# Patient Record
Sex: Male | Born: 1940 | Race: White | Hispanic: No | Marital: Married | Smoking: Former smoker
Health system: Southern US, Community
[De-identification: ages and names within clinical notes are randomized; demographics above are authoritative.]

## PROBLEM LIST (undated history)

## (undated) DIAGNOSIS — Z87442 Personal history of urinary calculi: Secondary | ICD-10-CM

## (undated) DIAGNOSIS — I639 Cerebral infarction, unspecified: Secondary | ICD-10-CM

## (undated) DIAGNOSIS — J189 Pneumonia, unspecified organism: Secondary | ICD-10-CM

## (undated) DIAGNOSIS — G252 Other specified forms of tremor: Principal | ICD-10-CM

## (undated) DIAGNOSIS — C801 Malignant (primary) neoplasm, unspecified: Secondary | ICD-10-CM

## (undated) DIAGNOSIS — N200 Calculus of kidney: Secondary | ICD-10-CM

## (undated) DIAGNOSIS — G250 Essential tremor: Secondary | ICD-10-CM

## (undated) DIAGNOSIS — I10 Essential (primary) hypertension: Secondary | ICD-10-CM

## (undated) HISTORY — PX: CHOLECYSTECTOMY: SHX55

## (undated) HISTORY — PX: KNEE SURGERY: SHX244

## (undated) HISTORY — DX: Malignant (primary) neoplasm, unspecified: C80.1

## (undated) HISTORY — PX: CATARACT EXTRACTION: SUR2

## (undated) HISTORY — PX: APPENDECTOMY: SHX54

## (undated) HISTORY — DX: Other specified forms of tremor: G25.2

## (undated) HISTORY — PX: SHOULDER SURGERY: SHX246

## (undated) HISTORY — PX: TRIGGER FINGER RELEASE: SHX641

## (undated) HISTORY — DX: Essential tremor: G25.0

---

## 1997-12-30 ENCOUNTER — Ambulatory Visit (HOSPITAL_BASED_OUTPATIENT_CLINIC_OR_DEPARTMENT_OTHER): Admission: RE | Admit: 1997-12-30 | Discharge: 1997-12-30 | Payer: Self-pay | Admitting: Orthopedic Surgery

## 1998-02-24 ENCOUNTER — Ambulatory Visit (HOSPITAL_BASED_OUTPATIENT_CLINIC_OR_DEPARTMENT_OTHER): Admission: RE | Admit: 1998-02-24 | Discharge: 1998-02-24 | Payer: Self-pay | Admitting: Orthopedic Surgery

## 1999-01-15 ENCOUNTER — Ambulatory Visit (HOSPITAL_BASED_OUTPATIENT_CLINIC_OR_DEPARTMENT_OTHER): Admission: RE | Admit: 1999-01-15 | Discharge: 1999-01-15 | Payer: Self-pay | Admitting: Orthopedic Surgery

## 2001-01-09 ENCOUNTER — Ambulatory Visit (HOSPITAL_BASED_OUTPATIENT_CLINIC_OR_DEPARTMENT_OTHER): Admission: RE | Admit: 2001-01-09 | Discharge: 2001-01-09 | Payer: Self-pay | Admitting: Orthopedic Surgery

## 2001-01-09 ENCOUNTER — Encounter (INDEPENDENT_AMBULATORY_CARE_PROVIDER_SITE_OTHER): Payer: Self-pay | Admitting: *Deleted

## 2003-04-10 ENCOUNTER — Encounter: Admission: RE | Admit: 2003-04-10 | Discharge: 2003-04-22 | Payer: Self-pay | Admitting: Orthopaedic Surgery

## 2003-04-26 DIAGNOSIS — I639 Cerebral infarction, unspecified: Secondary | ICD-10-CM

## 2003-04-26 HISTORY — DX: Cerebral infarction, unspecified: I63.9

## 2003-05-12 ENCOUNTER — Ambulatory Visit (HOSPITAL_BASED_OUTPATIENT_CLINIC_OR_DEPARTMENT_OTHER): Admission: RE | Admit: 2003-05-12 | Discharge: 2003-05-12 | Payer: Self-pay | Admitting: Plastic Surgery

## 2003-05-12 ENCOUNTER — Ambulatory Visit (HOSPITAL_COMMUNITY): Admission: RE | Admit: 2003-05-12 | Discharge: 2003-05-12 | Payer: Self-pay | Admitting: Plastic Surgery

## 2003-05-12 ENCOUNTER — Encounter (INDEPENDENT_AMBULATORY_CARE_PROVIDER_SITE_OTHER): Payer: Self-pay | Admitting: Specialist

## 2005-06-28 ENCOUNTER — Ambulatory Visit (HOSPITAL_COMMUNITY): Admission: RE | Admit: 2005-06-28 | Discharge: 2005-06-28 | Payer: Self-pay | Admitting: Internal Medicine

## 2006-03-03 ENCOUNTER — Encounter: Admission: RE | Admit: 2006-03-03 | Discharge: 2006-03-03 | Payer: Self-pay | Admitting: Otolaryngology

## 2006-10-31 ENCOUNTER — Ambulatory Visit (HOSPITAL_COMMUNITY): Admission: RE | Admit: 2006-10-31 | Discharge: 2006-10-31 | Payer: Self-pay | Admitting: Internal Medicine

## 2007-02-24 ENCOUNTER — Observation Stay (HOSPITAL_COMMUNITY): Admission: EM | Admit: 2007-02-24 | Discharge: 2007-02-25 | Payer: Self-pay | Admitting: Emergency Medicine

## 2007-03-12 ENCOUNTER — Ambulatory Visit (HOSPITAL_COMMUNITY): Admission: RE | Admit: 2007-03-12 | Discharge: 2007-03-12 | Payer: Self-pay | Admitting: Urology

## 2008-10-08 ENCOUNTER — Ambulatory Visit (HOSPITAL_COMMUNITY): Admission: RE | Admit: 2008-10-08 | Discharge: 2008-10-08 | Payer: Self-pay | Admitting: Internal Medicine

## 2009-01-22 ENCOUNTER — Encounter (INDEPENDENT_AMBULATORY_CARE_PROVIDER_SITE_OTHER): Payer: Self-pay | Admitting: General Surgery

## 2009-01-22 ENCOUNTER — Ambulatory Visit (HOSPITAL_COMMUNITY): Admission: RE | Admit: 2009-01-22 | Discharge: 2009-01-22 | Payer: Self-pay | Admitting: General Surgery

## 2009-03-10 ENCOUNTER — Ambulatory Visit (HOSPITAL_COMMUNITY): Admission: RE | Admit: 2009-03-10 | Discharge: 2009-03-10 | Payer: Self-pay | Admitting: Internal Medicine

## 2010-05-14 ENCOUNTER — Encounter
Admission: RE | Admit: 2010-05-14 | Discharge: 2010-05-14 | Payer: Self-pay | Source: Home / Self Care | Attending: Orthopedic Surgery | Admitting: Orthopedic Surgery

## 2010-05-17 ENCOUNTER — Encounter: Payer: Self-pay | Admitting: Internal Medicine

## 2010-07-28 LAB — CREATININE, SERUM: GFR calc Af Amer: >60 mL/min (ref >60)

## 2010-07-28 LAB — BUN: BUN: 12 mg/dL (ref 6–23)

## 2010-07-30 LAB — COMPREHENSIVE METABOLIC PANEL
AST: 25 U/L (ref 0–37)
BUN: 12 mg/dL (ref 6–23)
CO2: 29 mEq/L (ref 19–32)
Calcium: 9.5 mg/dL (ref 8.4–10.5)
Chloride: 107 mEq/L (ref 96–112)
Creatinine, Ser: 0.88 mg/dL (ref 0.4–1.5)
GFR calc Af Amer: >60 mL/min (ref >60)
GFR calc non Af Amer: >60 mL/min (ref >60)
Total Bilirubin: 0.7 mg/dL (ref 0.3–1.2)

## 2010-07-30 LAB — CBC
HCT: 44.8 % (ref 39.0–52.0)
MCHC: 34.4 g/dL (ref 30.0–36.0)
MCV: 95.6 fL (ref 78.0–100.0)
RBC: 4.68 MIL/uL (ref 4.22–5.81)

## 2010-07-30 LAB — BILIRUBIN, DIRECT: Bilirubin, Direct: <0.1 mg/dL (ref 0.0–0.3)

## 2010-09-07 NOTE — Op Note (Signed)
Ross [name], Ross [name]                ACCOUNT NO.:  [id number]   MEDICAL RECORD NO.:  [id number]          PATIENT TYPE:  OBV   LOCATION:  1433                         FACILITY:  [facility]   PHYSICIAN:  Sigmund I. Patsi Sears, M.D.DATE OF BIRTH:  [birth year]   DATE OF PROCEDURE:  02/25/2007  DATE OF DISCHARGE:  02/25/2007                               OPERATIVE REPORT   PREOPERATIVE DIAGNOSIS:  Impacted left upper ureteral calculus.   POSTOPERATIVE DIAGNOSES:  Impacted left upper ureteral calculus.   OPERATION:  Cystourethroscopy, left retrograde pyelogram with  interpretation, left ureteroscopy, laser fractionation of left renal  pelvic stone, left  double-J catheter (5-French x 24 cm Polaris).   SURGEON:  Sigmund I. Patsi Sears, M.D.   ANESTHESIA:  General LMA.   PREPARATION:  After appropriate preanesthesia, the patient is brought to  the operating room, placed on the operating room in dorsal supine  position where general LMA anesthesia was introduced.  He was then  replaced in dorsal lithotomy position where the pubis was prepped with  Betadine solution and draped in usual fashion.   HISTORY:  This 70 year old married male, was seen emergency room  yesterday, with impacted 6-mm left upper pole, left upper ureter stone.  The patient had nausea, vomiting and the ureteral colic.  In addition,  the patient was noted to have an 11 mm stone in the left renal pelvis,  and multiple smaller stones throughout the left kidney.  He is scheduled  to leave [place] for a hunting trip in [place] in four days.  In  addition, the patient has a past history of recent stroke, with some  visual field deficit, currently being treated with Aggrenox.  He was  felt to be a poor candidate for lithotripsy, and because that because of  his travel schedule, the need for surgical intervention to relieve the  colic was balanced against the history of recent stroke.  It was decided  to go ahead and then  take the patient to the operating room, and relieve  him of the ureteral stone, leave the double-J catheter in place.   PROCEDURE:  Cystourethroscopy accomplished, left retrograde pyelograms,  which showed a 6 mm approximately 1 cm distal to the left UPJ.  Note  that the left side had previously been marked.   Ureteroscopy was accomplished, and the stone was pushed into the renal  pelvis.  Using a laser fiber, the tone was fractionated, the pieces  removed.  Interestingly, I did not visualize the larger 11 mm stone,  seen on CT scan.  I did see least three infundibula, but I could not  manipulate the long ureteroscope nd into the calyces.   A 5-French x 24 cm Polaris stent was then manipulated into the renal  pelvis, with the bottom portion of the stent in the bladder.  The  patient was covered with IV antibiotic, as well as IV Toradol, awakened  and taken recovery room in good condition.      Sigmund I. Patsi Sears, M.D.  Electronically Signed     SIT/MEDQ  D:  02/25/2007  T:  02/26/2007  Job:  [id number]

## 2010-09-07 NOTE — H&P (Signed)
NAMEBURNICE, OESTREICHER                ACCOUNT NO.:  [id number]   MEDICAL RECORD NO.:  [id number]          PATIENT TYPE:  OBV   LOCATION:  1433                         FACILITY:  [facility]   PHYSICIAN:  Sigmund I. Patsi Sears, M.D.DATE OF BIRTH:  [birth year]   DATE OF ADMISSION:  02/24/2007  DATE OF DISCHARGE:  02/25/2007                              HISTORY & PHYSICAL   HISTORY:  Mr. Dalgleish is a 70 year old married male from [place] who  awoke at 4 a.m. this morning with GI upset, then noted beginning of left  flank pain at a level of 2/10 at 5 a.m.  He then began having nausea and  vomiting with repeat episodes of ureteral colic occurring 5x in the  early morning.  About 6 a.m. the patient's pain began to progress to a  level of 8/10 and he was seen in [facility] emergency room.  No fever,  no chills, except with vomiting (vagal reaction).  There is no gross  hematuria.  The patient did have a kidney stone approximately 10 years  ago, but never saw it.  Pain however resolved spontaneously.  He has not  been evaluated in the past for kidney stone formation.   PAST MEDICAL HISTORY:  1. 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  2. Melanoma right foot 2005.  3. Appendectomy, ruptured, age 89.  4. Bilateral arthroscopy of the knee, shoulder, foot, as well as      trigger finger surgery.  5. The past medical history is significant for hypertension, recent      onset.  6. Elevated cholesterol.  7. Hypogonadism.   MEDICATIONS:  1. Include Crestor 20 mg a day.  2. Aggrenox 25/250 b.i.d.  3. AndroGel (unknown dose).  4. Herbal medications including vitamin D, folic acid, Centrum      multivitamin plus eye vitamin and p.r.n. Zyrtec.   ALLERGIES:  None known.   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   PHYSICAL EXAMINATION:  GENERAL:  Shows a well-developed, well-nourished  male in no  acute distress.  VITAL SIGNS:  Blood pressure is 142/67, pulse 63, temperature 97.6.  NECK:  Supple, nontender.  CHEST:  Clear to P and A.  ABDOMEN:  Soft, decreasing bowel sounds without organomegaly and without  masses.  There is left lower quadrant pain to percussion at deep  palpation.  GENITOURINARY:  Shows normal penis, urethra and normal glans.  Testicles  measure 4 x 4 cm and nontender.  RECTAL:  Examination shows normal sphincter tone.  Perineum shows normal  inspection.  EXTREMITIES:  No cyanosis, no edema.  PSYCHOLOGIC:  Normal orientation to time, person and place.   IMPRESSION:  I have discussed the case thoroughly with the patient and  his wife.  The patient may be discharged with pain medication to follow  up on Monday with Dr. Logan Bores, but because of his pending hunting trip and  because  the patient has continued to have pain this  morning we will admit the  patient to the hospital for 23 hour observation and then plan on  cystoscopy, laser fractionation of stone in the a.m.  Note that the  patient is status post CVA in July and on Aggrenox.  I will consult with  Dr. Chestine Spore, or his on call physician, with regard to any medical  concerns.      Sigmund I. Patsi Sears, M.D.  Electronically Signed     SIT/MEDQ  D:  02/24/2007  T:  02/25/2007  Job:  [id number]   cc:   Margaretmary Bayley, M.D.  Fax: [phone]

## 2010-09-10 NOTE — Op Note (Signed)
[name], Ross [name]                            ACCOUNT NO.:  [id number]   MEDICAL RECORD NO.:  [id number]                   PATIENT TYPE:  REC   LOCATION:  [facility]H                                 FACILITY:  [facility]   PHYSICIAN:  Etter Sjogren, M.D.                  DATE OF BIRTH:  1941-01-31   DATE OF PROCEDURE:  05/12/2003  DATE OF DISCHARGE:  04/22/2003                                 OPERATIVE REPORT   PREOPERATIVE DIAGNOSIS:  1. 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  2. Evolving melanoma of the right foot, total area greater than 1 cm.   POSTOPERATIVE DIAGNOSIS:  1. 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  2. Evolving melanoma of the right foot, total area greater than 1 cm.  3. Complicated open wound of the scalp secondary to carcinoma excision.  4. Complicated open wound of the right foot secondary to evolving melanoma     excision.   OPERATION PERFORMED:   SURGEON:  Etter Sjogren, M.D.   ANESTHESIA:  1% Xylocaine with epinephrine plus bicarb as well as MAC  sedation.   INDICATIONS FOR PROCEDURE:  The patient is a 70 year old man who has a basal  cell carcinoma identified by his dermatologist. This was located on the left  side of his scalp.  Wider excision was planned.  In addition, he had a new  pigmented lesion on his right foot that was biopsied by his dermatologist.  This appeared to be a severely dysplastic nevus, possible evolving melanoma.  An excision with 4 mm margins was recommended by his dermatologist.  The  nature of these procedures and the risks as well as complications were  discussed including the possibility of further surgery, the possibility of  wound healing problems, including infection and he understood all this and  wished to proceed.   DESCRIPTION OF PROCEDURE:  The patient was taken to the operating room and  placed supine.  Under satisfactory sedation, he was prepped with Betadine  and draped with sterile drapes.  The margins  were marked around the basal  cell carcinoma, taking 4 mm margins around the basal cell carcinoma on his  scalp.  Margins marked around the lesion on the right foot again with 4 mm  margins as recommended by dermatologist.  Satisfactory local anesthesia was  achieved using 1% Xylocaine with epinephrine plus bicarb.  The excisions  were performed, the lesion on the foot was tagged on its superior suture.  Thorough irrigation and meticulous hemostasis having been achieved with  electrocautery, the scalp wound had to be undermined in order for closure.  Again confirming meticulous hemostasis, layered closure with 2-0 Vicryl  interrupted deep sutures and 2-0 Vicryl interrupted inverted deep dermal and  4-0 and 3-0 Prolene simple interrupted sutures.  For the foot 2-0 Vicryl  interrupted inverted deep sutures and 3-0 Prolene simple interrupted  sutures  to achieve the closure.  Dry sterile dressings were applied.  An Ace wrap  was applied from the foot up towards the leg to control edema for the right  foot and to help to promote wound healing.  The patient tolerated the  procedure well.   DISPOSITION:  See him back in the office in approximately two weeks.  Percocet __________ mg tablets for pain total of 20 given, one to two by  mouth every four to six hours as needed for pain.                                               Etter Sjogren, M.D.    DB/MEDQ  D:  05/12/2003  T:  05/12/2003  Job:  [id number]

## 2010-09-10 NOTE — Op Note (Signed)
[facility]  Patient:    LUMIR, DEMETRIOU Visit Number: [id number] MRN: [id number]          Service Type: DSU Location: [facility] Attending Physician:  Susa Day Dictated by:   Katy Fitch Naaman Plummer., M.D. Proc. Date: 01/09/01 Admit Date:  01/09/2001                             Operative Report  PREOPERATIVE DIAGNOSIS:  Painful mass, right ring finger volar radial aspect of middle phalanx adjacent to radial neurovascular bundle.  POSTOPERATIVE DIAGNOSIS:  Vascular lesion consistent with a hemangioma.  PROCEDURE:  Excisional biopsy of mass, right ring finger adjacent to radial neurovascular bundle.  SURGEON:  Katy Fitch. Sypher, Montez Hageman., M.D.  ASSISTANT:  Jonni Sanger, P.A.  ANESTHESIA:  Marcaine 0.25% and 2% lidocaine metacarpal head-level block, right ring finger, supervising anesthesiologist J. Claybon Jabs, M.D.  INDICATION:  Echo Allsbrook is a 70 year old gentleman who has had an enlarging mass on the volar radial aspect of his right ring finger for several months. This is increasing in size and at times quite uncomfortable.  He could note a discoloration of the mass through the skin.  He requested a hand surgery consult for excision and diagnosis.  DESCRIPTION OF PROCEDURE:  Faysal Fenoglio is brought to the operating room and placed in the supine position on the operating table.  Following placement of 0.25% Marcaine and 2% lidocaine metacarpal head-level block, the right arm was prepped with Betadine soap and solution and sterilely draped.  When anesthesia was satisfactory in the right ring finger, the arm was exsanguinated with an Esmarch bandage and an arterial tourniquet inflated to 230 mmHg on the proximal brachium.  The procedure commenced with a Brunner zigzag incision exposing the flexor sheath and radial neurovascular bundle.  In the subcutaneous and subfascial region, he was noted to have an 8 mm long x 6 mm wide  purplish-blue mass consistent with a hemangioma.  This was confluent with his superficial vein and adherent to the fascia as well as Graysons ligaments.  This was circumferentially dissected with a margin of normal-appearing vein and fascia.  This was passed off for pathologic evaluation in formalin.  The wound was then inspected for bleeding points, which were electrocauterized with bipolar current, followed by repair of the skin with interrupted sutures of 5-0 nylon.  A compressive dressing was applied with Xeroflo, sterile gauze, and Coban. There were no apparent complications.  Mr. Delbuono was transferred to the recovery room with stable vital signs.  For aftercare he was given prescription for Percocet 5 mg one or two tablets p.o. q.4-6h. p.r.n. pain, a total of 20 tablets without refill.  He will return to see me in the office for follow-up in approximately a week for suture removal. ictated by:   Katy Fitch. Naaman Plummer., M.D. Attending Physician:  Susa Day DD:  01/09/01 TD:  01/09/01 Job: [id number]

## 2011-02-01 LAB — URINALYSIS, ROUTINE W REFLEX MICROSCOPIC
Nitrite: NEGATIVE
Protein, ur: 100 — AB
Specific Gravity, Urine: 1.033 — ABNORMAL HIGH
Urobilinogen, UA: 0.2

## 2011-02-01 LAB — URINE MICROSCOPIC-ADD ON

## 2011-02-01 LAB — DIFFERENTIAL
Basophils Absolute: 0
Eosinophils Absolute: 0.1
Eosinophils Relative: 1
Lymphs Abs: 1.3

## 2011-02-01 LAB — COMPREHENSIVE METABOLIC PANEL
ALT: 31
AST: 24
CO2: 31
Chloride: 102
GFR calc non Af Amer: >60
Total Bilirubin: 0.7

## 2011-02-01 LAB — CBC
HCT: 45.8
Hemoglobin: 15.5
MCV: 95.2
Platelets: 220
RDW: 13.4

## 2011-03-31 ENCOUNTER — Ambulatory Visit (INDEPENDENT_AMBULATORY_CARE_PROVIDER_SITE_OTHER): Payer: Medicare Other | Admitting: Ophthalmology

## 2011-03-31 DIAGNOSIS — D3130 Benign neoplasm of unspecified choroid: Secondary | ICD-10-CM

## 2011-03-31 DIAGNOSIS — H35039 Hypertensive retinopathy, unspecified eye: Secondary | ICD-10-CM

## 2011-03-31 DIAGNOSIS — I10 Essential (primary) hypertension: Secondary | ICD-10-CM

## 2011-03-31 DIAGNOSIS — H34239 Retinal artery branch occlusion, unspecified eye: Secondary | ICD-10-CM

## 2012-04-02 ENCOUNTER — Ambulatory Visit (INDEPENDENT_AMBULATORY_CARE_PROVIDER_SITE_OTHER): Payer: Medicare Other | Admitting: Ophthalmology

## 2012-04-03 ENCOUNTER — Ambulatory Visit (INDEPENDENT_AMBULATORY_CARE_PROVIDER_SITE_OTHER): Payer: Medicare Other | Admitting: Ophthalmology

## 2012-04-03 DIAGNOSIS — H35039 Hypertensive retinopathy, unspecified eye: Secondary | ICD-10-CM

## 2012-04-03 DIAGNOSIS — H34239 Retinal artery branch occlusion, unspecified eye: Secondary | ICD-10-CM

## 2012-04-03 DIAGNOSIS — D3130 Benign neoplasm of unspecified choroid: Secondary | ICD-10-CM

## 2012-04-03 DIAGNOSIS — I10 Essential (primary) hypertension: Secondary | ICD-10-CM

## 2012-04-03 DIAGNOSIS — H43819 Vitreous degeneration, unspecified eye: Secondary | ICD-10-CM

## 2013-04-03 ENCOUNTER — Ambulatory Visit (INDEPENDENT_AMBULATORY_CARE_PROVIDER_SITE_OTHER): Payer: Medicare Other | Admitting: Ophthalmology

## 2013-04-22 ENCOUNTER — Encounter (HOSPITAL_COMMUNITY): Payer: Self-pay | Admitting: Emergency Medicine

## 2013-04-22 ENCOUNTER — Ambulatory Visit (INDEPENDENT_AMBULATORY_CARE_PROVIDER_SITE_OTHER)
Admission: RE | Admit: 2013-04-22 | Discharge: 2013-04-22 | Disposition: A | Discharge status: Home / Self Care | Payer: Medicare Other | Source: Ambulatory Visit | Attending: Emergency Medicine | Admitting: Emergency Medicine

## 2013-04-22 ENCOUNTER — Emergency Department (HOSPITAL_COMMUNITY): Payer: Medicare Other

## 2013-04-22 ENCOUNTER — Emergency Department (HOSPITAL_COMMUNITY)
Admission: EM | Admit: 2013-04-22 | Discharge: 2013-04-22 | Disposition: A | Discharge status: Home / Self Care | Payer: Medicare Other | Source: Home / Self Care

## 2013-04-22 DIAGNOSIS — J069 Acute upper respiratory infection, unspecified: Secondary | ICD-10-CM

## 2013-04-22 DIAGNOSIS — R05 Cough: Secondary | ICD-10-CM

## 2013-04-22 DIAGNOSIS — R059 Cough, unspecified: Secondary | ICD-10-CM

## 2013-04-22 HISTORY — DX: Essential (primary) hypertension: I10

## 2013-04-22 HISTORY — DX: Calculus of kidney: N20.0

## 2013-04-22 MED ORDER — PHENYLEPH-CPM-DM-APAP 5-2-10-250 MG PO TBEF: EFFERVESCENT_TABLET | ORAL | Status: DC

## 2013-04-22 NOTE — ED Notes (Signed)
Having problems with Fug[employer] Scanner can not get images to scan off cassette.

## 2013-04-22 NOTE — ED Provider Notes (Signed)
CSN: [id number]     Arrival date & time 04/22/13  8295 History   First MD Initiated Contact with Patient 04/22/13 [phone]     Chief Complaint  Patient presents with  . Cough   (Consider location/radiation/quality/duration/timing/severity/associated sxs/prior Treatment) HPI Comments: 72 year old male has had 3 days of cough and malaise. It has progressively become worse. He denies fever, earache and equivocally sore throat. He initially denied PND but later states that he thinks he may have drainage.   Past Medical History  Diagnosis Date  . Hypertension   . Kidney stones    Past Surgical History  Procedure Laterality Date  . Cholecystectomy    . Knee surgery    . Shoulder surgery     No family history on file. History  Substance Use Topics  . Smoking status: Never Smoker   . Smokeless tobacco: Not on file  . Alcohol Use: Yes    Review of Systems  Constitutional: Positive for activity change. Negative for fever, diaphoresis and fatigue.  HENT: Negative for congestion, ear pain, facial swelling, postnasal drip, rhinorrhea, sore throat and trouble swallowing.   Eyes: Negative for pain, discharge and redness.  Respiratory: Positive for cough. Negative for chest tightness, shortness of breath and wheezing.   Cardiovascular: Negative.   Gastrointestinal: Negative.   Musculoskeletal: Negative.  Negative for neck pain and neck stiffness.  Skin: Negative for pallor and rash.  Neurological: Negative.     Allergies  Review of patient's allergies indicates no known allergies.  Home Medications   Current Outpatient Rx  Name  Route  Sig  Dispense  Refill  . lisinopril (PRINIVIL,ZESTRIL) 20 MG tablet   Oral   Take 20 mg by mouth daily.         . Pitavastatin Calcium (LIVALO PO)   Oral   Take by mouth.         . Phenyleph-CPM-DM-APAP (ALKA-SELTZER PLUS COLD & FLU) 08-24-08-250 MG TBEF      Take 5 ml q 4-6 h prn cough and congestion.   112 tablet   0    BP 143/66   Pulse 96  Temp(Src) 98.2 F (36.8 C) (Oral)  Resp 16  SpO2 97% Physical Exam  Nursing note and vitals reviewed. Constitutional: He is oriented to person, place, and time. He appears well-developed and well-nourished. No distress.  HENT:  Bilateral TMs are normal Oropharynx with minor erythema and clear PND.  Eyes: Conjunctivae and EOM are normal.  Neck: Normal range of motion. Neck supple.  Cardiovascular: Normal rate, regular rhythm and normal heart sounds.   Pulmonary/Chest: Effort normal and breath sounds normal. No respiratory distress.  Excellent air movement in all fields. There are faint crackles in distant wheeze in the right mid field.  Musculoskeletal: Normal range of motion. He exhibits no edema.  Lymphadenopathy:    He has no cervical adenopathy.  Neurological: He is alert and oriented to person, place, and time.  Skin: Skin is warm and dry. No rash noted.  Psychiatric: He has a normal mood and affect.    ED Course  Procedures (including critical care time) Labs Review Labs Reviewed - No data to display Imaging Review No results found.    MDM   1. URI (upper respiratory infection)   2. Cough       As of 1016h the chest x-ray report has not been sent back. There is a problem with the digital aspect of sending the information over to the radiologist. Will allow the patient  to be discharged with a diagnosis of URI and cough. He is to have a prescription for Alka-Seltzer cold plus and flu. His lungs are clear, no signs of please and he has no fever. It is doubtful that he has a pneumonia, however, we will call results of his x-rays when they her back and if additional therapy is needed most likely we can manage that over the telephone.  Hayden Rasmussen, NP 04/22/13 1021

## 2013-04-22 NOTE — ED Notes (Signed)
Reports onset of cough on Friday 12/26.  Cough has worsened, phlegm had been clear until this morning, now has had some green/brown phlegm.  Generalized feeling bad.  Unknown if he has run a fever.  Denies runny nose, sore throat minimal, denies ear pain, does have bilateral rib soreness.

## 2013-04-23 NOTE — ED Provider Notes (Signed)
Medical screening examination/treatment/procedure(s) were performed by non-physician practitioner and as supervising physician I was immediately available for consultation/collaboration.  Garo Heidelberg, M.D.  Giuseppina Quinones C Nash Bolls, MD 04/23/13 0029 

## 2013-04-30 ENCOUNTER — Other Ambulatory Visit: Payer: Self-pay | Admitting: Internal Medicine

## 2013-04-30 DIAGNOSIS — R042 Hemoptysis: Secondary | ICD-10-CM

## 2013-05-01 ENCOUNTER — Encounter (HOSPITAL_COMMUNITY): Payer: Self-pay

## 2013-05-01 ENCOUNTER — Ambulatory Visit (HOSPITAL_COMMUNITY)
Admission: RE | Admit: 2013-05-01 | Discharge: 2013-05-01 | Disposition: A | Discharge status: Home / Self Care | Payer: Medicare Other | Source: Ambulatory Visit | Attending: Internal Medicine | Admitting: Internal Medicine

## 2013-05-01 DIAGNOSIS — J438 Other emphysema: Secondary | ICD-10-CM | POA: Insufficient documentation

## 2013-05-01 DIAGNOSIS — I700 Atherosclerosis of aorta: Secondary | ICD-10-CM | POA: Insufficient documentation

## 2013-05-01 DIAGNOSIS — K7689 Other specified diseases of liver: Secondary | ICD-10-CM | POA: Insufficient documentation

## 2013-05-01 DIAGNOSIS — K314 Gastric diverticulum: Secondary | ICD-10-CM | POA: Insufficient documentation

## 2013-05-01 DIAGNOSIS — R059 Cough, unspecified: Secondary | ICD-10-CM | POA: Insufficient documentation

## 2013-05-01 DIAGNOSIS — R042 Hemoptysis: Secondary | ICD-10-CM | POA: Insufficient documentation

## 2013-05-01 DIAGNOSIS — M47814 Spondylosis without myelopathy or radiculopathy, thoracic region: Secondary | ICD-10-CM | POA: Insufficient documentation

## 2013-05-01 DIAGNOSIS — R05 Cough: Secondary | ICD-10-CM | POA: Insufficient documentation

## 2013-05-01 DIAGNOSIS — F17200 Nicotine dependence, unspecified, uncomplicated: Secondary | ICD-10-CM | POA: Insufficient documentation

## 2013-05-01 DIAGNOSIS — N200 Calculus of kidney: Secondary | ICD-10-CM | POA: Insufficient documentation

## 2013-05-01 MED ORDER — IOHEXOL 300 MG/ML  SOLN
100.0000 mL | Freq: Once | INTRAMUSCULAR | Status: AC | PRN
  Administered 2013-05-01: 100 mL via INTRAVENOUS

## 2013-05-02 ENCOUNTER — Ambulatory Visit (INDEPENDENT_AMBULATORY_CARE_PROVIDER_SITE_OTHER): Payer: Self-pay | Admitting: Ophthalmology

## 2013-06-05 ENCOUNTER — Ambulatory Visit (INDEPENDENT_AMBULATORY_CARE_PROVIDER_SITE_OTHER): Payer: Medicare Other | Admitting: Ophthalmology

## 2013-06-05 DIAGNOSIS — H34239 Retinal artery branch occlusion, unspecified eye: Secondary | ICD-10-CM

## 2013-06-05 DIAGNOSIS — I10 Essential (primary) hypertension: Secondary | ICD-10-CM

## 2013-06-05 DIAGNOSIS — D3130 Benign neoplasm of unspecified choroid: Secondary | ICD-10-CM

## 2013-06-05 DIAGNOSIS — H35039 Hypertensive retinopathy, unspecified eye: Secondary | ICD-10-CM

## 2013-06-05 DIAGNOSIS — H34219 Partial retinal artery occlusion, unspecified eye: Secondary | ICD-10-CM

## 2013-06-05 DIAGNOSIS — H43819 Vitreous degeneration, unspecified eye: Secondary | ICD-10-CM

## 2013-06-11 ENCOUNTER — Encounter (HOSPITAL_COMMUNITY)
Admission: AD | Disposition: A | Discharge status: Home / Self Care | Payer: Self-pay | Source: Ambulatory Visit | Attending: Urology

## 2013-06-11 ENCOUNTER — Ambulatory Visit (HOSPITAL_COMMUNITY)
Admission: AD | Admit: 2013-06-11 | Discharge: 2013-06-11 | Disposition: A | Discharge status: Home / Self Care | Payer: Medicare Other | Source: Ambulatory Visit | Attending: Urology | Admitting: Urology

## 2013-06-11 ENCOUNTER — Encounter (HOSPITAL_COMMUNITY): Payer: Self-pay | Admitting: *Deleted

## 2013-06-11 ENCOUNTER — Encounter (HOSPITAL_COMMUNITY): Payer: Medicare Other | Admitting: Anesthesiology

## 2013-06-11 ENCOUNTER — Ambulatory Visit (HOSPITAL_COMMUNITY): Payer: Medicare Other | Admitting: Anesthesiology

## 2013-06-11 DIAGNOSIS — E7800 Pure hypercholesterolemia, unspecified: Secondary | ICD-10-CM | POA: Insufficient documentation

## 2013-06-11 DIAGNOSIS — I10 Essential (primary) hypertension: Secondary | ICD-10-CM | POA: Insufficient documentation

## 2013-06-11 DIAGNOSIS — Z85820 Personal history of malignant melanoma of skin: Secondary | ICD-10-CM | POA: Insufficient documentation

## 2013-06-11 DIAGNOSIS — Z87891 Personal history of nicotine dependence: Secondary | ICD-10-CM | POA: Insufficient documentation

## 2013-06-11 DIAGNOSIS — Z79899 Other long term (current) drug therapy: Secondary | ICD-10-CM | POA: Insufficient documentation

## 2013-06-11 DIAGNOSIS — N201 Calculus of ureter: Secondary | ICD-10-CM

## 2013-06-11 DIAGNOSIS — Z8673 Personal history of transient ischemic attack (TIA), and cerebral infarction without residual deficits: Secondary | ICD-10-CM | POA: Insufficient documentation

## 2013-06-11 HISTORY — PX: CYSTOSCOPY WITH RETROGRADE PYELOGRAM, URETEROSCOPY AND STENT PLACEMENT: SHX5789

## 2013-06-11 LAB — BASIC METABOLIC PANEL
BUN: 20 mg/dL (ref 6–23)
CO2: 30 meq/L (ref 19–32)
Calcium: 9 mg/dL (ref 8.4–10.5)
Chloride: 100 mEq/L (ref 96–112)
Creatinine, Ser: 1.66 mg/dL — ABNORMAL HIGH (ref 0.50–1.35)
GFR calc Af Amer: 46 mL/min — ABNORMAL LOW (ref >90)
GFR calc non Af Amer: 40 mL/min — ABNORMAL LOW (ref >90)
GLUCOSE: 103 mg/dL — AB (ref 70–99)
POTASSIUM: 5.1 meq/L (ref 3.7–5.3)
SODIUM: 138 meq/L (ref 137–147)

## 2013-06-11 LAB — CBC
HEMATOCRIT: 39.8 % (ref 39.0–52.0)
HEMOGLOBIN: 13.8 g/dL (ref 13.0–17.0)
MCH: 32.6 pg (ref 26.0–34.0)
MCHC: 34.7 g/dL (ref 30.0–36.0)
MCV: 94.1 fL (ref 78.0–100.0)
Platelets: 181 10*3/uL (ref 150–400)
RBC: 4.23 MIL/uL (ref 4.22–5.81)
RDW: 13.1 % (ref 11.5–15.5)
WBC: 6.3 10*3/uL (ref 4.0–10.5)

## 2013-06-11 SURGERY — CYSTOURETEROSCOPY, WITH RETROGRADE PYELOGRAM AND STENT INSERTION
Anesthesia: General | Laterality: Left

## 2013-06-11 MED ORDER — TAMSULOSIN HCL 0.4 MG PO CAPS: 0.4000 mg | ORAL_CAPSULE | Freq: Every day | ORAL | Status: DC

## 2013-06-11 MED ORDER — FENTANYL CITRATE 0.05 MG/ML IJ SOLN
INTRAMUSCULAR | Status: AC
  Filled 2013-06-11: qty 2

## 2013-06-11 MED ORDER — BELLADONNA ALKALOIDS-OPIUM 16.2-60 MG RE SUPP
RECTAL | Status: DC | PRN
  Administered 2013-06-11: 1 via RECTAL

## 2013-06-11 MED ORDER — CEFAZOLIN SODIUM-DEXTROSE 2-3 GM-% IV SOLR
2.0000 g | Freq: Once | INTRAVENOUS | Status: AC
  Administered 2013-06-11: 2 g via INTRAVENOUS

## 2013-06-11 MED ORDER — TRIMETHOPRIM 100 MG PO TABS: 100.0000 mg | ORAL_TABLET | ORAL | Status: DC

## 2013-06-11 MED ORDER — PROPOFOL 10 MG/ML IV BOLUS
INTRAVENOUS | Status: DC | PRN
  Administered 2013-06-11: 170 mg via INTRAVENOUS

## 2013-06-11 MED ORDER — CEFAZOLIN SODIUM-DEXTROSE 2-3 GM-% IV SOLR
INTRAVENOUS | Status: AC
  Filled 2013-06-11: qty 50

## 2013-06-11 MED ORDER — PROPOFOL 10 MG/ML IV BOLUS
INTRAVENOUS | Status: AC
  Filled 2013-06-11: qty 20

## 2013-06-11 MED ORDER — LACTATED RINGERS IV SOLN: INTRAVENOUS | Status: DC

## 2013-06-11 MED ORDER — URELLE 81 MG PO TABS: 1.0000 | ORAL_TABLET | Freq: Three times a day (TID) | ORAL | Status: DC

## 2013-06-11 MED ORDER — ONDANSETRON HCL 4 MG/2ML IJ SOLN
INTRAMUSCULAR | Status: DC | PRN
  Administered 2013-06-11: 4 mg via INTRAVENOUS

## 2013-06-11 MED ORDER — TRAMADOL-ACETAMINOPHEN 37.5-325 MG PO TABS: 1.0000 | ORAL_TABLET | Freq: Four times a day (QID) | ORAL | Status: DC | PRN

## 2013-06-11 MED ORDER — SODIUM CHLORIDE 0.9 % IR SOLN
Status: DC | PRN
  Administered 2013-06-11: 1000 mL via INTRAVESICAL

## 2013-06-11 MED ORDER — KETOROLAC TROMETHAMINE 30 MG/ML IJ SOLN
INTRAMUSCULAR | Status: DC | PRN
  Administered 2013-06-11: 30 mg via INTRAVENOUS

## 2013-06-11 MED ORDER — KETOROLAC TROMETHAMINE 30 MG/ML IJ SOLN
INTRAMUSCULAR | Status: AC
  Filled 2013-06-11: qty 1

## 2013-06-11 MED ORDER — FENTANYL CITRATE 0.05 MG/ML IJ SOLN
INTRAMUSCULAR | Status: DC | PRN
  Administered 2013-06-11: 50 ug via INTRAVENOUS
  Administered 2013-06-11 (×2): 25 ug via INTRAVENOUS

## 2013-06-11 MED ORDER — BELLADONNA ALKALOIDS-OPIUM 16.2-60 MG RE SUPP
RECTAL | Status: AC
  Filled 2013-06-11: qty 1

## 2013-06-11 MED ORDER — FENTANYL CITRATE 0.05 MG/ML IJ SOLN: 25.0000 ug | INTRAMUSCULAR | Status: DC | PRN

## 2013-06-11 MED ORDER — ONDANSETRON HCL 4 MG/2ML IJ SOLN
INTRAMUSCULAR | Status: AC
  Filled 2013-06-11: qty 2

## 2013-06-11 MED ORDER — LIDOCAINE HCL 2 % EX GEL
CUTANEOUS | Status: AC
  Filled 2013-06-11: qty 10

## 2013-06-11 MED ORDER — LACTATED RINGERS IV SOLN
INTRAVENOUS | Status: DC | PRN
  Administered 2013-06-11: 15:00:00 via INTRAVENOUS

## 2013-06-11 SURGICAL SUPPLY — 16 items
BAG URO CATCHER STRL LF (DRAPE) ×3 IMPLANT
CATH INTERMIT  6FR 70CM (CATHETERS) ×3 IMPLANT
CLOTH BEACON ORANGE TIMEOUT ST (SAFETY) ×3 IMPLANT
DRAPE CAMERA CLOSED 9X96 (DRAPES) ×3 IMPLANT
GLOVE BIOGEL M STRL SZ7.5 (GLOVE) ×3 IMPLANT
GOWN STRL REUS W/ TWL LRG LVL3 (GOWN DISPOSABLE) IMPLANT
GOWN STRL REUS W/TWL LRG LVL3 (GOWN DISPOSABLE) ×5 IMPLANT
GOWN STRL REUS W/TWL XL LVL3 (GOWN DISPOSABLE) ×3 IMPLANT
GUIDEWIRE STR DUAL SENSOR (WIRE) ×3 IMPLANT
MANIFOLD NEPTUNE II (INSTRUMENTS) ×3 IMPLANT
NS IRRIG 1000ML POUR BTL (IV SOLUTION) ×3 IMPLANT
PACK CYSTO (CUSTOM PROCEDURE TRAY) ×3 IMPLANT
SCRUB PCMX 4 OZ (MISCELLANEOUS) ×3 IMPLANT
STENT CONTOUR 6FRX24X.038 (STENTS) ×2 IMPLANT
TUBING CONNECTING 10 (TUBING) ×2 IMPLANT
TUBING CONNECTING 10' (TUBING) ×1

## 2013-06-11 NOTE — H&P (Signed)
Problems  1. Obstruction of left ureteropelvic junction due to stone (592.1)   Assessed By: Carolan Clines (Urology); Last Assessed: 11 Jun 2013  History of Present Illness       73 YO male with nausea, vomiting, single episode of gross hematuria, and L flank pain 5 days ago. he was treated with pain med and nausea med, and flomax, and brought in today for counselling. He is a known calcium oxalate stone former. Sodas: None x 7 years. No regular meds for kidney stones. No direct relatives have a hx of kidney stones. He is still having episodic L flank pain.   GU HX:  Hx of a 3.80mm Rt distal ureteral stone. Patient has since passed the stone. He states today that all his symptoms have resolved. Last seen on 06/07/11 for complaint of acute right flank pain with nocturia and frequency X 5 days. Hx of Ca/oxlate stone formation.     Hx right flank pain and low back pain since 10/08/08. Lumbar spine xray: no acute findings. Lower lumbar spine degenerative changes. Bilateral nephrolithiasis. Rib xray: negative. Has been treated in 2008 for impacted left ureteral calculus with cystoureterscopy, L RPG, double J stent placement and ESWL by Dr. Gaynelle Arabian.      Past Medical History Problems  1. History of hypercholesterolemia (V12.29) 2. History of hypertension (V12.59) 3. 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 History of Malignant Melanoma Of The Skin (K48.18)  Surgical History Problems  1. History of Cystoscopy With Manipulation Of Ureteral Calculus 2. History of Gallbladder Surgery 3. History of Lithotripsy 4. History of Skin Debridement 5. 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 Centrum TABS;  Therapy: (Recorded:04Nov2008) to Recorded 2. Folic Acid 563 MCG Oral Tablet;  Therapy: (Recorded:04Nov2008) to Recorded 3. HYDROmorphone HCl - 2 MG Oral Tablet; TAKE 1 TABLET EVERY 4 TO 6 HOURS AS  NEEDED FOR PAIN;  Therapy:  [id number] to (Evaluate:17Feb2015); Last Rx:13Feb2015 Ordered 4. Lisinopril 20 MG Oral Tablet;  Therapy: 03Sep2014 to Recorded 5. Livalo 1 MG Oral Tablet;  Therapy: (Recorded:12Feb2013) to Recorded 6. Ondansetron 8 MG Oral Tablet Dispersible; TAKE 1 TABLET Every 6 hours PRN;  Therapy: [id number] to (Last Rx:13Feb2015)  Requested for: [id number] Ordered 7. Tamsulosin HCl - 0.4 MG Oral Capsule; TAKE 1 CAPSULE Daily;  Therapy: [id number] to (Evaluate:15Mar2015)  Requested for: [id number]; Last  Rx:13Feb2015 Ordered 8. Vitamin B Complex CAPS;  Therapy: (Recorded:29Jun2010) to Recorded 9. Vitamin D TABS;  Therapy: (Recorded:04Nov2008) to Recorded 10. Zyrtec TABS;   Therapy: (Recorded:13Feb2015) to Recorded  Allergies Medication  1. No Known Drug Allergies  Family History Problems  1. Family history of Esophageal Cancer (V16.0) : Brother 2. Family history of Family Health Status Number Of Children   1 son, 2 daughters 3. Family history of Father Deceased At Age ____   64m hodgkins disease 4. Family history of Mother Deceased At Age ____   40, chf  Social History Problems  1. Former Smoker   1 ppd x 30 yrs, quit 10 yrs ago 2. Marital History - Currently Married 3. Occupation:   retired  Review of Systems Constitutional, skin, eye, otolaryngeal, hematologic/lymphatic, cardiovascular, pulmonary, endocrine, musculoskeletal, neurological and psychiatric system(s) were reviewed and pertinent findings if present are noted.  Genitourinary: hematuria, but no urinary frequency, no feelings of urinary urgency, no dysuria, no incontinence, no difficulty starting the urinary stream, urine stream is not weak, urinary stream does not start and stop, no incomplete emptying of bladder, no post-void  dribbling, no testicular pain and initiating urination does not require straining.  Gastrointestinal: nausea, flank pain, abdominal pain and constipation, but no vomiting, no heartburn, no diarrhea and  no melena.    Vitals Vital Signs [Data Includes: Last 1 Day]  Recorded: [id number] 12:46PM  Blood Pressure: 127 / 76 Temperature: 97 F Heart Rate: 68  Physical Exam Constitutional: Well nourished and well developed . No acute distress.  ENT:. The ears and nose are normal in appearance.  Neck: The appearance of the neck is normal and no neck mass is present.  Pulmonary: No respiratory distress and normal respiratory rhythm and effort.  Cardiovascular: Heart rate and rhythm are normal . No peripheral edema.  Abdomen: The abdomen is soft and nontender. No masses are palpated. No CVA tenderness. No hernias are palpable. No hepatosplenomegaly noted.  Genitourinary: Examination of the penis demonstrates no discharge, no masses, no lesions and a normal meatus. The scrotum is without lesions. The right epididymis is palpably normal and non-tender. The left epididymis is palpably normal and non-tender. The right testis is non-tender and without masses. The left testis is non-tender and without masses.  Lymphatics: The femoral and inguinal nodes are not enlarged or tender.  Skin: Normal skin turgor, no visible rash and no visible skin lesions.  Neuro/Psych:. Mood and affect are appropriate.    Results/Data Urine [Data Includes: Last 1 Day]   [id number]  COLOR YELLOW   APPEARANCE CLEAR   SPECIFIC GRAVITY 1.020   pH 6.5   GLUCOSE NEG mg/dL  BILIRUBIN NEG   KETONE NEG mg/dL  BLOOD SMALL   PROTEIN NEG mg/dL  UROBILINOGEN 1 mg/dL  NITRITE NEG   LEUKOCYTE ESTERASE TRACE   SQUAMOUS EPITHELIAL/HPF NONE SEEN   WBC 7-10 WBC/hpf  RBC 3-6 RBC/hpf  BACTERIA NONE SEEN   CRYSTALS NONE SEEN   CASTS NONE SEEN    Assessment Assessed  1. Obstruction of left ureteropelvic junction due to stone (592.1) 2. Nausea (787.02) 3. Backache (724.5)  large left UPJ stone-13.66mm aggregate. He probably needs a stent and then lithotripsy.   He has been drinking water with lemon in it.   Plan Stent today,  and then lithotripsy. Keep on Flomax. Change pain med.   Signatures Electronically signed by : Carolan Clines, M.D.; Jun 11 2013  1:26PM EST

## 2013-06-11 NOTE — Anesthesia Preprocedure Evaluation (Addendum)
Anesthesia Evaluation  Patient identified by MRN, date of birth, ID band Patient awake    Reviewed: Allergy & Precautions, H&P , NPO status , Patient's Chart, lab work & pertinent test results  Airway Mallampati: II TM Distance: >3 FB Neck ROM: full    Dental no notable dental hx. (+) Teeth Intact, Dental Advisory Given   Pulmonary neg pulmonary ROS,  breath sounds clear to auscultation  Pulmonary exam normal       Cardiovascular Exercise Tolerance: Good hypertension, Pt. on medications Rhythm:regular Rate:Normal     Neuro/Psych negative neurological ROS  negative psych ROS   GI/Hepatic negative GI ROS, Neg liver ROS,   Endo/Other  negative endocrine ROS  Renal/GU negative Renal ROS  negative genitourinary   Musculoskeletal   Abdominal   Peds  Hematology negative hematology ROS (+)   Anesthesia Other Findings   Reproductive/Obstetrics negative OB ROS                          Anesthesia Physical Anesthesia Plan  ASA: II  Anesthesia Plan: General   Post-op Pain Management:    Induction: Intravenous  Airway Management Planned: LMA  Additional Equipment:   Intra-op Plan:   Post-operative Plan:   Informed Consent: I have reviewed the patients History and Physical, chart, labs and discussed the procedure including the risks, benefits and alternatives for the proposed anesthesia with the patient or authorized representative who has indicated his/her understanding and acceptance.   Dental Advisory Given  Plan Discussed with: CRNA and Surgeon  Anesthesia Plan Comments:         Anesthesia Quick Evaluation

## 2013-06-11 NOTE — H&P (Signed)
3.2cm columnated stone L UPJ, symptomatic, for JJ stent today. Will need f/u lithotripsy. Agree with H/P per NP.

## 2013-06-11 NOTE — Op Note (Signed)
Pre-operative diagnosis :   Left UPJ stone aggregate, 3.5 cm  Postoperative diagnosis:  Same  Operation:  Cystourethroscopy, left retrograde PolyGram interpretation, left double-J stent (6 [place] by 24 cm).  Surgeon:  Chauncey Cruel. Gaynelle Arabian, MD  First assistant:  None  Anesthesia:  General LMA  Preparation:  After appropriate preanesthesia, the patient was brought to the operative room, placed in the upper table in the dorsal supine position where general LMA anesthesia was introduced. He was then replaced in the dorsal lithotomy position with pubis was prepped with Betadine solution and draped in usual fashion. The arm band was double checked. The history was double checked.  Review history:  Obstruction of left ureteropelvic junction due to stone (592.1)   Assessed By: Carolan Clines (Urology); Last Assessed: 11 Jun 2013  History of Present Illness  73 YO male with nausea, vomiting, single episode of gross hematuria, and L flank pain 5 days ago. he was treated with pain med and nausea med, and flomax, and brought in today for counselling. He is a known calcium oxalate stone former. Sodas: None x 7 years. No regular meds for kidney stones. No direct relatives have a hx of kidney stones. He is still having episodic L flank pain.  GU HX:  Hx of a 3.60mm Rt distal ureteral stone. Patient has since passed the stone. He states today that all his symptoms have resolved. Last seen on 06/07/11 for complaint of acute right flank pain with nocturia and frequency X 5 days. Hx of Ca/oxlate stone formation.  Hx right flank pain and low back pain since 10/08/08. Lumbar spine xray: no acute findings. Lower lumbar spine degenerative changes. Bilateral nephrolithiasis. Rib xray: negative. Has been treated in 2008 for impacted left ureteral calculus with cystoureterscopy, L RPG, double J stent placement and ESWL by Dr. Gaynelle Arabian.      Statement of  Likelihood of Success: Excellent. TIME-OUT  observed.:  Procedure:  Cystourethroscopy was accomplished showing normal appearing urethra. The prostate was bi-lobe are. The bladder neck was normal. The bladder itself showed normal trigone. There was clear reflux from the right ureteral orifice. I did not see flux in the left ureteral orifice, however. Left retrograde pyelogram showed normal ureter, with stone material identified at the left ureteropelvic junction. A guidewire was passed into the renal pelvis, and over the guidewire, a 6 [place] by 24 cm double-J stent was passed without difficulty. It was coiled in the upper pole renal pelvis, and in the bladder. The bladder was drained of fluid. The patient was given IV Toradol, awakened, taken to recovery room in good condition.

## 2013-06-11 NOTE — Transfer of Care (Signed)
Immediate Anesthesia Transfer of Care Note  Patient: Ross [name].  Procedure(s) Performed: Procedure(s): CYSTOSCOPY WITH RETROGRADE Left PYELOGRAM, URETEROSCOPY AND Left STENT PLACEMENT (Left)  Patient Location: PACU  Anesthesia Type:General  Level of Consciousness: awake, sedated and patient cooperative  Airway & Oxygen Therapy: Patient Spontanous Breathing and Patient connected to face mask oxygen  Post-op Assessment: Report given to PACU RN and Post -op Vital signs reviewed and stable  Post vital signs: Reviewed and stable  Complications: No apparent anesthesia complications

## 2013-06-11 NOTE — Interval H&P Note (Signed)
History and Physical Interval Note:  06/11/2013 4:19 PM  Ross [name].  has presented today for surgery, with the diagnosis of left ureteral stone  The various methods of treatment have been discussed with the patient and family. After consideration of risks, benefits and other options for treatment, the patient has consented to  Procedure(s): CYSTOSCOPY WITH RETROGRADE Left PYELOGRAM, URETEROSCOPY AND Left STENT PLACEMENT (Left) as a surgical intervention .  The patient's history has been reviewed, patient examined, no change in status, stable for surgery.  I have reviewed the patient's chart and labs.  Questions were answered to the patient's satisfaction.     Carolan Clines I

## 2013-06-11 NOTE — Discharge Instructions (Signed)
Kidney Stones Kidney stones (urolithiasis) are deposits that form inside your kidneys. The intense pain is caused by the stone moving through the urinary tract. When the stone moves, the ureter goes into spasm around the stone. The stone is usually passed in the urine.  CAUSES   A disorder that makes certain neck glands produce too much parathyroid hormone (primary hyperparathyroidism).  A buildup of uric acid crystals, similar to gout in your joints.  Narrowing (stricture) of the ureter.  A kidney obstruction present at birth (congenital obstruction).  Previous surgery on the kidney or ureters.  Numerous kidney infections. SYMPTOMS   Feeling sick to your stomach (nauseous).  Throwing up (vomiting).  Blood in the urine (hematuria).  Pain that usually spreads (radiates) to the groin.  Frequency or urgency of urination. DIAGNOSIS   Taking a history and physical exam.  Blood or urine tests.  CT scan.  Occasionally, an examination of the inside of the urinary bladder (cystoscopy) is performed. TREATMENT   Observation.  Increasing your fluid intake.  Extracorporeal shock wave lithotripsy This is a noninvasive procedure that uses shock waves to break up kidney stones.  Surgery may be needed if you have severe pain or persistent obstruction. There are various surgical procedures. Most of the procedures are performed with the use of small instruments. Only small incisions are needed to accommodate these instruments, so recovery time is minimized. The size, location, and chemical composition are all important variables that will determine the proper choice of action for you. Talk to your health care provider to better understand your situation so that you will minimize the risk of injury to yourself and your kidney.  HOME CARE INSTRUCTIONS   Drink enough water and fluids to keep your urine clear or pale yellow. This will help you to pass the stone or stone fragments.  Strain  all urine through the provided strainer. Keep all particulate matter and stones for your health care provider to see. The stone causing the pain may be as small as a grain of salt. It is very important to use the strainer each and every time you pass your urine. The collection of your stone will allow your health care provider to analyze it and verify that a stone has actually passed. The stone analysis will often identify what you can do to reduce the incidence of recurrences.  Only take over-the-counter or prescription medicines for pain, discomfort, or fever as directed by your health care provider.  Make a follow-up appointment with your health care provider as directed.  Get follow-up X-rays if required. The absence of pain does not always mean that the stone has passed. It may have only stopped moving. If the urine remains completely obstructed, it can cause loss of kidney function or even complete destruction of the kidney. It is your responsibility to make sure X-rays and follow-ups are completed. Ultrasounds of the kidney can show blockages and the status of the kidney. Ultrasounds are not associated with any radiation and can be performed easily in a matter of minutes. SEEK MEDICAL CARE IF:  You experience pain that is progressive and unresponsive to any pain medicine you have been prescribed. SEEK IMMEDIATE MEDICAL CARE IF:   Pain cannot be controlled with the prescribed medicine.  You have a fever or shaking chills.  The severity or intensity of pain increases over 18 hours and is not relieved by pain medicine.  You develop a new onset of abdominal pain.  You feel faint or pass  out. °· You are unable to urinate. °MAKE SURE YOU:  °· Understand these instructions. °· Will watch your condition. °· Will get help right away if you are not doing well or get worse. °Document Released: 04/11/2005 Document Revised: 12/12/2012 Document Reviewed: 09/12/2012 °ExitCare® Patient Information ©2014  [employer]. ° °Ureteral Colic (Kidney Stones) °Ureteral colic is the result of a condition when kidney stones form inside the kidney. Once kidney stones are formed they may move into the tube that connects the kidney with the bladder (ureter). If this occurs, this condition may cause pain (colic) in the ureter.  °CAUSES  °Pain is caused by stone movement in the ureter and the obstruction caused by the stone. °SYMPTOMS  °The pain comes and goes as the ureter contracts around the stone. The pain is usually intense, sharp, and stabbing in character. The location of the pain may move as the stone moves through the ureter. When the stone is near the kidney the pain is usually located in the back and radiates to the belly (abdomen). When the stone is ready to pass into the bladder the pain is often located in the lower abdomen on the side the stone is located. At this location, the symptoms may mimic those of a urinary tract infection with urinary frequency. Once the stone is located here it often passes into the bladder and the pain disappears completely. °TREATMENT  °· Your caregiver will provide you with medicine for pain relief. °· You may require specialized follow-up X-rays. °· The absence of pain does not always mean that the stone has passed. It may have just stopped moving. If the urine remains completely obstructed, it can cause loss of kidney function or even complete destruction of the involved kidney. It is your responsibility and in your interest that X-rays and follow-ups as suggested by your caregiver are completed. Relief of pain without passage of the stone can be associated with severe damage to the kidney, including loss of kidney function on that side. °· If your stone does not pass on its own, additional measures may be taken by your caregiver to ensure its removal. °HOME CARE INSTRUCTIONS  °· Increase your fluid intake. Water is the preferred fluid since juices containing vitamin C may acidify  the urine making it less likely for certain stones (uric acid stones) to pass. °· Strain all urine. A strainer will be provided. Keep all particulate matter or stones for your caregiver to inspect. °· Take your pain medicine as directed. °· Make a follow-up appointment with your caregiver as directed. °· Remember that the goal is passage of your stone. The absence of pain does not mean the stone is gone. Follow your caregiver's instructions. °· Only take over-the-counter or prescription medicines for pain, discomfort, or fever as directed by your caregiver. °SEEK MEDICAL CARE IF:  °· Pain cannot be controlled with the prescribed medicine. °· You have a fever. °· Pain continues for longer than your caregiver advises it should. °· There is a change in the pain, and you develop chest discomfort or constant abdominal pain. °· You feel faint or pass out. °MAKE SURE YOU:  °· Understand these instructions. °· Will watch your condition. °· Will get help right away if you are not doing well or get worse. °Document Released: 01/19/2005 Document Revised: 08/06/2012 Document Reviewed: 10/06/2010 °ExitCare® Patient Information ©2014 [employer]. ° ° °

## 2013-06-11 NOTE — Anesthesia Postprocedure Evaluation (Signed)
  Anesthesia Post-op Note  Patient: Ross [name].  Procedure(s) Performed: Procedure(s) (LRB): CYSTOSCOPY WITH RETROGRADE Left PYELOGRAM, URETEROSCOPY AND Left STENT PLACEMENT (Left)  Patient Location: PACU  Anesthesia Type: General  Level of Consciousness: awake and alert   Airway and Oxygen Therapy: Patient Spontanous Breathing  Post-op Pain: mild  Post-op Assessment: Post-op Vital signs reviewed, Patient's Cardiovascular Status Stable, Respiratory Function Stable, Patent Airway and No signs of Nausea or vomiting  Last Vitals:  Filed Vitals:   06/11/13 1736  BP: 135/57  Pulse: 59  Temp: 36.4 C  Resp: 16    Post-op Vital Signs: stable   Complications: No apparent anesthesia complications

## 2013-06-12 ENCOUNTER — Encounter (HOSPITAL_COMMUNITY): Payer: Self-pay | Admitting: Urology

## 2013-06-12 ENCOUNTER — Other Ambulatory Visit: Payer: Self-pay | Admitting: Urology

## 2013-06-18 ENCOUNTER — Encounter (HOSPITAL_COMMUNITY): Payer: Self-pay

## 2013-06-18 ENCOUNTER — Encounter (HOSPITAL_COMMUNITY): Payer: Self-pay | Admitting: Pharmacy Technician

## 2013-06-18 NOTE — Progress Notes (Signed)
Spoke with pt via phone regarding lithotripsy for March 16.  Gave pt the following info.:  Npo after midnight the night before procedure.  No aspirin/motrin 72 hours before litho.  Informed pt to stop his aspirin March 11 and stop vitamins/herbs on March 7.  Informed pt to bring his blue folder the day of litho filled out to the best of his ability.  Bring insur.card/drivers license.  Informed pt about taking laxative the night before litho also.  Pt voiced understanding to all teaching.  Gave pt [phone] if he has questions/concerns.

## 2013-06-28 NOTE — Progress Notes (Signed)
Patient called and made aware of date and time change for ESWL.

## 2013-07-04 ENCOUNTER — Encounter (HOSPITAL_COMMUNITY)
Admission: RE | Disposition: A | Discharge status: Home / Self Care | Payer: Self-pay | Source: Ambulatory Visit | Attending: Urology

## 2013-07-04 ENCOUNTER — Ambulatory Visit (HOSPITAL_COMMUNITY)
Admission: RE | Admit: 2013-07-04 | Discharge: 2013-07-04 | Disposition: A | Discharge status: Home / Self Care | Payer: Medicare Other | Source: Ambulatory Visit | Attending: Urology | Admitting: Urology

## 2013-07-04 ENCOUNTER — Ambulatory Visit (HOSPITAL_COMMUNITY): Payer: Medicare Other

## 2013-07-04 ENCOUNTER — Encounter (HOSPITAL_COMMUNITY): Payer: Self-pay | Admitting: *Deleted

## 2013-07-04 DIAGNOSIS — Z87891 Personal history of nicotine dependence: Secondary | ICD-10-CM | POA: Insufficient documentation

## 2013-07-04 DIAGNOSIS — E7800 Pure hypercholesterolemia, unspecified: Secondary | ICD-10-CM | POA: Insufficient documentation

## 2013-07-04 DIAGNOSIS — Z79899 Other long term (current) drug therapy: Secondary | ICD-10-CM | POA: Insufficient documentation

## 2013-07-04 DIAGNOSIS — Z85820 Personal history of malignant melanoma of skin: Secondary | ICD-10-CM | POA: Insufficient documentation

## 2013-07-04 DIAGNOSIS — Z8673 Personal history of transient ischemic attack (TIA), and cerebral infarction without residual deficits: Secondary | ICD-10-CM | POA: Insufficient documentation

## 2013-07-04 DIAGNOSIS — N200 Calculus of kidney: Secondary | ICD-10-CM | POA: Insufficient documentation

## 2013-07-04 DIAGNOSIS — N201 Calculus of ureter: Secondary | ICD-10-CM | POA: Insufficient documentation

## 2013-07-04 DIAGNOSIS — I10 Essential (primary) hypertension: Secondary | ICD-10-CM | POA: Insufficient documentation

## 2013-07-04 HISTORY — DX: Cerebral infarction, unspecified: I63.9

## 2013-07-04 SURGERY — LITHOTRIPSY, ESWL
Anesthesia: LOCAL | Laterality: Left

## 2013-07-04 MED ORDER — DIAZEPAM 5 MG PO TABS
10.0000 mg | ORAL_TABLET | ORAL | Status: AC
  Administered 2013-07-04: 10 mg via ORAL
  Filled 2013-07-04: qty 2

## 2013-07-04 MED ORDER — DEXTROSE-NACL 5-0.45 % IV SOLN
INTRAVENOUS | Status: DC
  Administered 2013-07-04: 07:00:00 via INTRAVENOUS

## 2013-07-04 MED ORDER — CIPROFLOXACIN HCL 500 MG PO TABS
500.0000 mg | ORAL_TABLET | ORAL | Status: AC
  Administered 2013-07-04: 500 mg via ORAL
  Filled 2013-07-04: qty 1

## 2013-07-04 MED ORDER — DIPHENHYDRAMINE HCL 25 MG PO CAPS
25.0000 mg | ORAL_CAPSULE | ORAL | Status: AC
  Administered 2013-07-04: 25 mg via ORAL
  Filled 2013-07-04: qty 1

## 2013-07-04 NOTE — Discharge Instructions (Signed)
DISCHARGE INSTRUCTIONS FOR KIDNEY STONES OR URETERAL STENT ° °MEDICATIONS:  ° °1. DO NOT RESUME YOUR ASPIRIN, or any other medicines like ibuprofen, motrin, excedrin, advil, aleve, vitamin E, fish oil as these can all cause bleeding x 7 days. ° °2. Resume all your other meds from home - except do not take any other pain meds that you may have at home. ° °ACTIVITY °1. No strenuous activity x 1week °2. No driving while on narcotic pain medications °3. Drink plenty of water °4. Continue to walk at home - you can still get blood clots when you are at home, so keep active, but don't over do it. °5. May return to work in 3 days. ° °BATHING °1. You can shower and we recommend daily showers  °2. If you have a string coming from your urethra:  The stent string is attached to your ureteral stent.  Do not pull on this. ° ° °SIGNS/SYMPTOMS TO CALL: °1. Please call us if you have a fever greater than 101.5, uncontrolled  °nausea/vomiting, uncontrolled pain, dizziness, unable to urinate, bloody urine, chest pain, shortness of breath, leg swelling, leg pain, redness around wound, drainage from wound, or any other concerns or questions. ° °You can reach us at [phone]. ° °FOLLOW-UP °You have an appointment: call [phone] for appointment for stent removal.  °  You may feel an odd sensation in your back. OK for occasional blood in the urine.  °

## 2013-07-04 NOTE — Interval H&P Note (Signed)
History and Physical Interval Note:  07/04/2013 8:32 AM  Ross [name].  has presented today for surgery, with the diagnosis of LEFT UPJ STONE  The various methods of treatment have been discussed with the patient and family. After consideration of risks, benefits and other options for treatment, the patient has consented to  Procedure(s): LEFT EXTRACORPOREAL SHOCK WAVE LITHOTRIPSY (ESWL) (Left) as a surgical intervention .  The patient's history has been reviewed, patient examined, no change in status, stable for surgery.  I have reviewed the patient's chart and labs.  Questions were answered to the patient's satisfaction.     Carolan Clines I

## 2013-07-04 NOTE — H&P (Signed)
oblems  1. Obstruction of left ureteropelvic junction due to stone (592.1)   Assessed By: Carolan Clines (Urology); Last Assessed: 11 Jun 2013  History of Present Illness       73 YO male with nausea, vomiting, single episode of gross hematuria, and L flank pain 5 days ago. he was treated with pain med and nausea med, and flomax, and brought in today for counselling. He is a known calcium oxalate stone former. Sodas: None x 7 years. No regular meds for kidney stones. No direct relatives have a hx of kidney stones. He is still having episodic L flank pain.   GU HX:  Hx of a 3.66mm Rt distal ureteral stone. Patient has since passed the stone. He states today that all his symptoms have resolved. Last seen on 06/07/11 for complaint of acute right flank pain with nocturia and frequency X 5 days. Hx of Ca/oxlate stone formation.     Hx right flank pain and low back pain since 10/08/08. Lumbar spine xray: no acute findings. Lower lumbar spine degenerative changes. Bilateral nephrolithiasis. Rib xray: negative. Has been treated in 2008 for impacted left ureteral calculus with cystoureterscopy, L RPG, double J stent placement and ESWL by Dr. Gaynelle Arabian.      Past Medical History Problems  1. History of hypercholesterolemia (V12.29) 2. History of hypertension (V12.59) 3. 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 History of Cystoscopy With Manipulation Of Ureteral Calculus 2. History of Gallbladder Surgery 3. History of Lithotripsy 4. History of Skin Debridement 5. 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 Centrum TABS;  Therapy: (Recorded:04Nov2008) to Recorded 2. Folic Acid 449 MCG Oral Tablet;  Therapy: (Recorded:04Nov2008) to Recorded 3. HYDROmorphone HCl - 2 MG Oral Tablet; TAKE 1 TABLET EVERY 4 TO 6 HOURS AS  NEEDED FOR PAIN;  Therapy:  [id number] to (Evaluate:17Feb2015); Last Rx:13Feb2015 Ordered 4. Lisinopril 20 MG Oral Tablet;  Therapy: 03Sep2014 to Recorded 5. Livalo 1 MG Oral Tablet;  Therapy: (Recorded:12Feb2013) to Recorded 6. Ondansetron 8 MG Oral Tablet Dispersible; TAKE 1 TABLET Every 6 hours PRN;  Therapy: [id number] to (Last Rx:13Feb2015)  Requested for: [id number] Ordered 7. Tamsulosin HCl - 0.4 MG Oral Capsule; TAKE 1 CAPSULE Daily;  Therapy: [id number] to (Evaluate:15Mar2015)  Requested for: [id number]; Last  Rx:13Feb2015 Ordered 8. Vitamin B Complex CAPS;  Therapy: (Recorded:29Jun2010) to Recorded 9. Vitamin D TABS;  Therapy: (Recorded:04Nov2008) to Recorded 10. Zyrtec TABS;   Therapy: (Recorded:13Feb2015) to Recorded  Allergies Medication  1. No Known Drug Allergies  Family History Problems  1. Family history of Esophageal Cancer (V16.0) : Brother 2. Family history of Family Health Status Number Of Children   1 son, 2 daughters 3. Family history of Father Deceased At Age ____   66m hodgkins disease 4. Family history of Mother Deceased At Age ____   22, chf  Social History Problems  1. Former Smoker   1 ppd x 30 yrs, quit 10 yrs ago 2. Marital History - Currently Married 3. Occupation:   retired  Review of Systems Constitutional, skin, eye, otolaryngeal, hematologic/lymphatic, cardiovascular, pulmonary, endocrine, musculoskeletal, neurological and psychiatric system(s) were reviewed and pertinent findings if present are noted.  Genitourinary: hematuria, but no urinary frequency, no feelings of urinary urgency, no dysuria, no incontinence, no difficulty starting the urinary stream, urine stream is not weak, urinary stream does not start and stop, no incomplete emptying of bladder, no post-void  dribbling, no testicular pain and initiating urination does not require straining.  Gastrointestinal: nausea, flank pain, abdominal pain and constipation, but no vomiting, no heartburn, no diarrhea and  no melena.    Vitals Vital Signs [Data Includes: Last 1 Day]  Recorded: [id number] 12:46PM  Blood Pressure: 127 / 76 Temperature: 97 F Heart Rate: 68  Physical Exam Constitutional: Well nourished and well developed . No acute distress.  ENT:. The ears and nose are normal in appearance.  Neck: The appearance of the neck is normal and no neck mass is present.  Pulmonary: No respiratory distress and normal respiratory rhythm and effort.  Cardiovascular: Heart rate and rhythm are normal . No peripheral edema.  Abdomen: The abdomen is soft and nontender. No masses are palpated. No CVA tenderness. No hernias are palpable. No hepatosplenomegaly noted.  Genitourinary: Examination of the penis demonstrates no discharge, no masses, no lesions and a normal meatus. The scrotum is without lesions. The right epididymis is palpably normal and non-tender. The left epididymis is palpably normal and non-tender. The right testis is non-tender and without masses. The left testis is non-tender and without masses.  Lymphatics: The femoral and inguinal nodes are not enlarged or tender.  Skin: Normal skin turgor, no visible rash and no visible skin lesions.  Neuro/Psych:. Mood and affect are appropriate.    Results/Data Urine [Data Includes: Last 1 Day]   [id number]  COLOR YELLOW   APPEARANCE CLEAR   SPECIFIC GRAVITY 1.020   pH 6.5   GLUCOSE NEG mg/dL  BILIRUBIN NEG   KETONE NEG mg/dL  BLOOD SMALL   PROTEIN NEG mg/dL  UROBILINOGEN 1 mg/dL  NITRITE NEG   LEUKOCYTE ESTERASE TRACE   SQUAMOUS EPITHELIAL/HPF NONE SEEN   WBC 7-10 WBC/hpf  RBC 3-6 RBC/hpf  BACTERIA NONE SEEN   CRYSTALS NONE SEEN   CASTS NONE SEEN    Assessment Assessed  1. Obstruction of left ureteropelvic junction due to stone (592.1) 2. Nausea (787.02) 3. Backache (724.5)  large left UPJ stone-13.57mm aggregate. He probably needs a stent and then lithotripsy.   He has been drinking water with lemon in it.   Plan Stent today,  and then lithotripsy. Keep on Flomax. Change pain med.   Signatures Electronically signed by : Carolan Clines, M.D.; Jun 11 2013  1:26PM EST

## 2013-07-04 NOTE — Interval H&P Note (Signed)
History and Physical Interval Note:  07/04/2013 8:33 AM  Ross [name].  has presented today for surgery, with the diagnosis of LEFT UPJ STONE  The various methods of treatment have been discussed with the patient and family. After consideration of risks, benefits and other options for treatment, the patient has consented to  Procedure(s): LEFT EXTRACORPOREAL SHOCK WAVE LITHOTRIPSY (ESWL) (Left) as a surgical intervention .  The patient's history has been reviewed, patient examined, no change in status, stable for surgery.  I have reviewed the patient's chart and labs.  Questions were answered to the patient's satisfaction.     Carolan Clines I

## 2013-08-30 ENCOUNTER — Encounter: Payer: Self-pay | Admitting: Neurology

## 2013-08-30 ENCOUNTER — Ambulatory Visit (INDEPENDENT_AMBULATORY_CARE_PROVIDER_SITE_OTHER): Payer: Medicare Other | Admitting: Neurology

## 2013-08-30 VITALS — BP 116/62 | HR 79 | Ht 67.0 in | Wt 188.0 lb

## 2013-08-30 DIAGNOSIS — G250 Essential tremor: Secondary | ICD-10-CM

## 2013-08-30 DIAGNOSIS — G252 Other specified forms of tremor: Principal | ICD-10-CM

## 2013-08-30 HISTORY — DX: Essential tremor: G25.0

## 2013-08-30 MED ORDER — ALPRAZOLAM 0.25 MG PO TABS: 0.2500 mg | ORAL_TABLET | Freq: Three times a day (TID) | ORAL | Status: DC | PRN

## 2013-08-30 NOTE — Progress Notes (Signed)
Reason for visit:  Tremor  Ross [name]. is a 73 y.o. male  History of present illness:  Ross [name] is a 73 year old right-handed white male with a history of a tremor that affects mainly the right arm that began approximately one year ago. He is right-handed, and he is noted that he is having some issues with handwriting, and with feeding himself. He enjoys shooting pistols, and within the last 2 weeks, he indicates that he had to switch to using the left arm for this activity. He indicates that when he is under stress, the tremor is more noticeable. He has not noted any effect of alcohol on the tremor. He denies any balance issues or problems with falls. He denies any family history of tremor. There have been no reports of head or neck tremors, or vocal tremors. No numbness or weakness of the extremities has been noted. The patient is sent to this office for further evaluation. Over time, the tremor has gradually worsened.  Past Medical History  Diagnosis Date  . Hypertension   . Kidney stones   . Stroke 2005?  Marland Kitchen Essential and other specified forms of tremor 08/30/2013  . Cancer     skin basal cell    Past Surgical History  Procedure Laterality Date  . Cholecystectomy    . Knee surgery      bilateral arthroscopic  . Shoulder surgery      right   . Cystoscopy with retrograde pyelogram, ureteroscopy and stent placement Left 06/11/2013    Procedure: CYSTOSCOPY WITH RETROGRADE Left PYELOGRAM, URETEROSCOPY AND Left STENT PLACEMENT;  Surgeon: Ailene Rud, MD;  Location: [facility];  Service: Urology;  Laterality: Left;  . Cataract extraction      bilateral  . Appendectomy      Family History  Problem Relation Age of Onset  . Stroke Mother   . COPD Mother   . Cancer - Other Brother     esophageal  . Fibromyalgia Brother     Social history:  reports that he has quit smoking. His smoking use included Cigarettes. He has a 18 pack-year smoking history. He has never used  smokeless tobacco. He reports that he drinks alcohol. He reports that he does not use illicit drugs.  Medications:  Current Outpatient Prescriptions on File Prior to Visit  Medication Sig Dispense Refill  . Artificial Tear Ointment (DRY EYES OP) Place 1 drop into both eyes 2 (two) times daily.      Marland Kitchen aspirin 325 MG tablet Take 325 mg by mouth daily.      . cetirizine (ZYRTEC) 10 MG tablet Take 10 mg by mouth daily.      Marland Kitchen lisinopril (PRINIVIL,ZESTRIL) 20 MG tablet Take 20 mg by mouth every evening.       . Pitavastatin Calcium (LIVALO) 2 MG TABS Take 1 mg by mouth every morning. Takes 1/2 tablet      . PRESCRIPTION MEDICATION once a week. Allergy shot on mondays      . tamsulosin (FLOMAX) 0.4 MG CAPS capsule Take 0.4 mg by mouth daily.       No current facility-administered medications on file prior to visit.     No Known Allergies  ROS:  Out of a complete 14 system review of symptoms, the patient complains only of the following symptoms, and all other reviewed systems are negative.  Allergies Tremor  Blood pressure 116/62, pulse 79, height 5\' 7"  (1.702 m), weight 188 lb (85.276 kg).  Physical Exam  General: The patient is alert and cooperative at the time of the examination.  Eyes: Pupils are equal, round, and reactive to light. Discs are flat bilaterally.  Neck: The neck is supple, no carotid bruits are noted.  Respiratory: The respiratory examination is clear.  Cardiovascular: The cardiovascular examination reveals a regular rate and rhythm, no obvious murmurs or rubs are noted.  Skin: Extremities are without significant edema.  Neurologic Exam  Mental status: The patient is alert and oriented x 3 at the time of the examination. The patient has apparent normal recent and remote memory, with an apparently normal attention span and concentration ability.  Cranial nerves: Facial symmetry is present. There is good sensation of the face to pinprick and soft touch  bilaterally. The strength of the facial muscles and the muscles to head turning and shoulder shrug are normal bilaterally. Speech is well enunciated, no aphasia or dysarthria is noted. Extraocular movements are full. Visual fields are full. The tongue is midline, and the patient has symmetric elevation of the soft palate. No obvious hearing deficits are noted.  Motor: The motor testing reveals 5 over 5 strength of all 4 extremities. Good symmetric motor tone is noted throughout.  Sensory: Sensory testing is intact to pinprick, soft touch, vibration sensation, and position sense on all 4 extremities. No evidence of extinction is noted.  Coordination: Cerebellar testing reveals good finger-nose-finger and heel-to-shin bilaterally. A mild intention tremor is noted bilaterally.  Gait and station: Gait is normal. Tandem gait is normal. The patient has excellent arm swing with walking. No tremor seen while walking. Romberg is negative. No drift is seen.  Reflexes: Deep tendon reflexes are symmetric and normal bilaterally. Toes are downgoing bilaterally.   Assessment/Plan:  1. 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 The tremor on the right hand appear to be slightly worse. The patient has had a recent thyroid study done that was unremarkable. The patient likely has a benign essential tremor, and this tremor would likely will worsen gradually over time. There is no evidence of parkinsonism at this time. We have discussed various treatments. At this point, we will use alprazolam in low dose intermittently during activities that require fine motor control. In the future, he may require daily medications such as propranolol for the tremor. He will followup through this office in 6-8 months.   Jill Alexanders MD 08/30/2013 9:19 AM  [facility] [address] [place], [place] [address]  Phone  [phone] Fax [phone]

## 2013-08-30 NOTE — Patient Instructions (Signed)
Tremor  Tremor is a rhythmic, involuntary muscular contraction characterized by oscillations (to-and-fro movements) of a part of the body. The most common of all involuntary movements, tremor can affect various body parts such as the hands, head, facial structures, vocal cords, trunk, and legs; most tremors, however, occur in the hands. Tremor often accompanies neurological disorders associated with aging. Although the disorder is not life-threatening, it can be responsible for functional disability and social embarrassment.  TREATMENT   There are many types of tremor and several ways in which tremor is classified. The most common classification is by behavioral context or position. There are five categories of tremor within this classification: resting, postural, kinetic, task-specific, and psychogenic. Resting or static tremor occurs when the muscle is at rest, for example when the hands are lying on the lap. This type of tremor is often seen in patients with Parkinson's disease. Postural tremor occurs when a patient attempts to maintain posture, such as holding the hands outstretched. Postural tremors include physiological tremor, essential tremor, tremor with basal ganglia disease (also seen in patients with Parkinson's disease), cerebellar postural tremor, tremor with peripheral neuropathy, post-traumatic tremor, and alcoholic tremor. Kinetic or intention (action) tremor occurs during purposeful movement, for example during finger-to-nose testing. Task-specific tremor appears when performing goal-oriented tasks such as handwriting, speaking, or standing. This group consists of primary writing tremor, vocal tremor, and orthostatic tremor. Psychogenic tremor occurs in both older and younger patients. The key feature of this tremor is that it dramatically lessens or disappears when the patient is distracted.  PROGNOSIS  There are some treatment options available for tremor; the appropriate treatment depends on  accurate diagnosis of the cause. Some tremors respond to treatment of the underlying condition, for example in some cases of psychogenic tremor treating the patient's underlying mental problem may cause the tremor to disappear. Also, patients with tremor due to Parkinson's disease may be treated with Levodopa drug therapy. Symptomatic drug therapy is available for several other tremors as well. For those cases of tremor in which there is no effective drug treatment, physical measures such as teaching the patient to brace the affected limb during the tremor are sometimes useful. Surgical intervention such as thalamotomy or deep brain stimulation may be useful in certain cases.  Document Released: 04/01/2002 Document Revised: 07/04/2011 Document Reviewed: 04/11/2005  [employer]® Patient Information ©2014 [employer].

## 2014-02-07 ENCOUNTER — Other Ambulatory Visit: Payer: Self-pay

## 2014-03-03 ENCOUNTER — Ambulatory Visit (INDEPENDENT_AMBULATORY_CARE_PROVIDER_SITE_OTHER): Payer: Medicare Other | Admitting: Neurology

## 2014-03-03 ENCOUNTER — Encounter: Payer: Self-pay | Admitting: Neurology

## 2014-03-03 VITALS — BP 124/76 | HR 60 | Ht 70.0 in | Wt 190.2 lb

## 2014-03-03 DIAGNOSIS — G250 Essential tremor: Secondary | ICD-10-CM

## 2014-03-03 DIAGNOSIS — G252 Other specified forms of tremor: Principal | ICD-10-CM

## 2014-03-03 DIAGNOSIS — R251 Tremor, unspecified: Secondary | ICD-10-CM

## 2014-03-03 NOTE — Progress Notes (Signed)
Reason for visit: tremor  Ross [name]. is an 73 y.o. male  History of present illness:  Ross [name] is a 73 year old right-handed white male with a history of an essential tremor. The patient has a very mild tremor, and he indicates that if anything, the tremor has actually improved since last seen. The patient was given a prescription for alprazolam, but he has not required this medication whatsoever. The patient denies the has to give up any activities of daily living secondary to tremor. He denies that any other new medical issues that have come up since last seen.  Past Medical History  Diagnosis Date  . Hypertension   . Kidney stones   . Stroke 2005?  Marland Kitchen Essential and other specified forms of tremor 08/30/2013  . Cancer     skin basal cell    Past Surgical History  Procedure Laterality Date  . Cholecystectomy    . Knee surgery      bilateral arthroscopic  . Shoulder surgery      right   . Cystoscopy with retrograde pyelogram, ureteroscopy and stent placement Left 06/11/2013    Procedure: CYSTOSCOPY WITH RETROGRADE Left PYELOGRAM, URETEROSCOPY AND Left STENT PLACEMENT;  Surgeon: Ailene Rud, MD;  Location: [facility];  Service: Urology;  Laterality: Left;  . Cataract extraction      bilateral  . Appendectomy      Family History  Problem Relation Age of Onset  . Stroke Mother   . COPD Mother   . Cancer - Other Brother     esophageal  . Fibromyalgia Brother     Social history:  reports that he has quit smoking. His smoking use included Cigarettes. He has a 18 pack-year smoking history. He has never used smokeless tobacco. He reports that he drinks alcohol. He reports that he does not use illicit drugs.   No Known Allergies  Medications:  Current Outpatient Prescriptions on File Prior to Visit  Medication Sig Dispense Refill  . Artificial Tear Ointment (DRY EYES OP) Place 1 drop into both eyes 2 (two) times daily.    Marland Kitchen aspirin 325 MG tablet Take 325 mg by  mouth daily.    . cetirizine (ZYRTEC) 10 MG tablet Take 10 mg by mouth daily.    Marland Kitchen lisinopril (PRINIVIL,ZESTRIL) 20 MG tablet Take 20 mg by mouth every evening.     Marland Kitchen PRESCRIPTION MEDICATION once a week. Allergy shot on mondays     No current facility-administered medications on file prior to visit.    ROS:  Out of a complete 14 system review of symptoms, the patient complains only of the following symptoms, and all other reviewed systems are negative.  Tremor   Blood pressure 124/76, pulse 60, height 5\' 10"  (1.778 m), weight 190 lb 3.2 oz (86.274 kg).  Physical Exam  General: The patient is alert and cooperative at the time of the examination.  Skin: No significant peripheral edema is noted.   Neurologic Exam  Mental status: The patient is oriented x 3.  Cranial nerves: Facial symmetry is present. Speech is normal, no aphasia or dysarthria is noted. Extraocular movements are full. Visual fields are full.  Motor: The patient has good strength in all 4 extremities.  Sensory examination: Soft touch sensation on the face, arms, and legs is symmetric.  Coordination: The patient has good finger-nose-finger and heel-to-shin bilaterally.  Gait and station: The patient has a normal gait. Tandem gait is normal. Romberg is negative. No drift is  seen.  Reflexes: Deep tendon reflexes are symmetric.   Assessment/Plan:  1. Essential tremor  The patient is doing fairly well at this time with the tremor. He does not require any medications, I will have him follow-up if needed. He will contact me if the tremor does worsen over time and he requires medication.  Jill Alexanders MD 03/03/2014 9:01 PM  [facility] [address] [place], [place] [address]  Phone [phone] Fax [phone]

## 2014-03-03 NOTE — Patient Instructions (Signed)

## 2014-06-26 ENCOUNTER — Ambulatory Visit (INDEPENDENT_AMBULATORY_CARE_PROVIDER_SITE_OTHER): Payer: Medicare Other | Admitting: Podiatry

## 2014-06-26 ENCOUNTER — Ambulatory Visit (INDEPENDENT_AMBULATORY_CARE_PROVIDER_SITE_OTHER): Payer: Medicare Other

## 2014-06-26 ENCOUNTER — Encounter: Payer: Self-pay | Admitting: Podiatry

## 2014-06-26 VITALS — BP 133/71 | HR 61 | Resp 12

## 2014-06-26 DIAGNOSIS — R52 Pain, unspecified: Secondary | ICD-10-CM

## 2014-06-26 DIAGNOSIS — M779 Enthesopathy, unspecified: Secondary | ICD-10-CM | POA: Diagnosis not present

## 2014-06-26 MED ORDER — TRIAMCINOLONE ACETONIDE 10 MG/ML IJ SUSP
10.0000 mg | Freq: Once | INTRAMUSCULAR | Status: AC
  Administered 2014-06-26: 10 mg

## 2014-06-26 NOTE — Progress Notes (Signed)
Subjective:     Patient ID: [name], male   DOB: 01/15/1941, 74 y.o.   MRN: [id number]  HPI patient presents stating I been getting a lot of pain in the outside of my left foot which seems to have gradually intensified. I've tried soaks changes in shoe gear and reduced activity without resultant been present for several months   Review of Systems  All other systems reviewed and are negative.      Objective:   Physical Exam  Constitutional: He is oriented to person, place, and time.  Cardiovascular: Intact distal pulses.   Musculoskeletal: Normal range of motion.  Neurological: He is oriented to person, place, and time.  Skin: Skin is warm.  Nursing note and vitals reviewed.  neurovascular status intact with muscle strength adequate and range of motion of the subtalar midtarsal joint within normal limits. Patient's noted to have inflammation and fluid buildup around the base of the fifth metatarsal left with tendon that is strong but appears to be inflamed. Digits are well-perfused and patient's well oriented with no other noted foot pathology     Assessment:     Peroneal tendinitis base of fifth metatarsal at insertion    Plan:     H&P and x-ray reviewed and I injected the left base 3 mg dexamethasone Kenalog 5 mg Xylocaine and applied fascial brace and instructed on reduced activity area reappoint to recheck

## 2014-06-26 NOTE — Progress Notes (Signed)
   Subjective:    Patient ID: Ross [name]., male    DOB: [birth year], 74 y.o.   MRN: [id number]  HPI  PT STATED LT FOOT LATERAL SIDE AND BOTTOM OF THE FOOT HAVE SHARP PAIN AND DULL PAIN FOR 4 WEEKS. THE FOOT IS BEEN THE SAME BUT NOT WORSE AND GET AGGRAVATED BY WALKING/PRESSURE. TRIED TO SOAK WITH EPSON SALT AND ICE IT HELP TEMPORARY.  Review of Systems  All other systems reviewed and are negative.      Objective:   Physical Exam        Assessment & Plan:

## 2014-07-10 ENCOUNTER — Encounter: Payer: Self-pay | Admitting: Podiatry

## 2014-07-10 ENCOUNTER — Ambulatory Visit (INDEPENDENT_AMBULATORY_CARE_PROVIDER_SITE_OTHER): Payer: Medicare Other | Admitting: Podiatry

## 2014-07-10 VITALS — BP 129/65 | HR 66 | Resp 14

## 2014-07-10 DIAGNOSIS — M779 Enthesopathy, unspecified: Secondary | ICD-10-CM

## 2014-07-10 NOTE — Progress Notes (Signed)
Subjective:     Patient ID: Ross [name]., male   DOB: 1940/06/24, 74 y.o.   MRN: [id number]  HPI patient presents stating the left foot is improved but there still some discomfort present but he's able to walk better than he was previously and he's been using ice   Review of Systems     Objective:   Physical Exam Neurovascular status intact muscle strength adequate with discomfort in the lateral side of the left peroneal that is improved with good muscle strength noted    Assessment:     Improve tendinitis left    Plan:     Instructed on ice therapy supportive shoe gear usage and reduced activity. Reappoint if symptoms recur

## 2014-07-10 NOTE — Progress Notes (Signed)
   Subjective:    Patient ID: Ross Level., male    DOB: 1940/07/18, 74 y.o.   MRN: [id number]  HPI Left foot pain, injection last visit, some better, still some pain.   Review of Systems  All other systems reviewed and are negative.      Objective:   Physical Exam        Assessment & Plan:

## 2014-08-12 DIAGNOSIS — M779 Enthesopathy, unspecified: Secondary | ICD-10-CM

## 2014-09-11 ENCOUNTER — Other Ambulatory Visit: Payer: Self-pay | Admitting: Urology

## 2014-09-15 ENCOUNTER — Encounter: Payer: Self-pay | Admitting: Cardiology

## 2014-09-15 ENCOUNTER — Ambulatory Visit (INDEPENDENT_AMBULATORY_CARE_PROVIDER_SITE_OTHER): Payer: Medicare Other | Admitting: Cardiology

## 2014-09-15 ENCOUNTER — Encounter (HOSPITAL_COMMUNITY): Payer: Self-pay | Admitting: *Deleted

## 2014-09-15 VITALS — BP 132/62 | HR 80 | Ht 68.0 in | Wt 188.0 lb

## 2014-09-15 DIAGNOSIS — I700 Atherosclerosis of aorta: Secondary | ICD-10-CM

## 2014-09-15 NOTE — Progress Notes (Signed)
Cardiology Office Note   Date:  09/15/2014   ID:  Ross Eudy., DOB November 24, 1940, MRN [id number]  PCP:  Foye Spurling, MD  Cardiologist:   Minus Breeding, MD   Chief Complaint  Patient presents with  . ABNORMAL CT      History of Present Illness: Ross [name]. is a 74 y.o. male who presents for evaluation of aortic calcification. He's being evaluated for kidney stone and had a CT. I actually reviewed these images personally. He had some calcium in his aorta down into his iliacs. He has been seen here before we were able to retrieve old charts. He was seen here previously and I did see his most recent echo in 2012 demonstrated normal left ventricular function with some mild aortic valve calcification. He's had minimal plaque on carotid Dopplers. Stress perfusion study in 2008 demonstrated no evidence of ischemia. He otherwise has had no cardiovascular symptoms. He's been a little limited by joint pains but he does a little bit of walking some activities of daily living. The patient denies any new symptoms such as chest discomfort, neck or arm discomfort. There has been no PND or orthopnea. There have been no reported palpitations, presyncope or syncope.  He might get some dyspnea with exertion.  Past Medical History  Diagnosis Date  . Hypertension   . Kidney stones   . Stroke 2005  . Essential and other specified forms of tremor 08/30/2013  . Cancer     skin basal cell    Past Surgical History  Procedure Laterality Date  . Cholecystectomy    . Knee surgery      bilateral arthroscopic  . Shoulder surgery      right   . Cystoscopy with retrograde pyelogram, ureteroscopy and stent placement Left 06/11/2013    Procedure: CYSTOSCOPY WITH RETROGRADE Left PYELOGRAM, URETEROSCOPY AND Left STENT PLACEMENT;  Surgeon: Ailene Rud, MD;  Location: [facility];  Service: Urology;  Laterality: Left;  . Cataract extraction      bilateral  . Appendectomy       Current  Outpatient Prescriptions  Medication Sig Dispense Refill  . allopurinol (ZYLOPRIM) 100 MG tablet Take 100 mg by mouth daily.    . Artificial Tear Ointment (DRY EYES OP) Place 1 drop into both eyes 2 (two) times daily.    Marland Kitchen aspirin 81 MG tablet Take 81 mg by mouth daily.    Marland Kitchen b complex vitamins tablet Take 1 tablet by mouth daily.    . cetirizine (ZYRTEC) 10 MG tablet Take 10 mg by mouth daily.    . cholecalciferol (VITAMIN D) 1000 UNITS tablet Take 2,000 Units by mouth daily.    Marland Kitchen FLUZONE HIGH-DOSE 0.5 ML SUSY   0  . folic acid (FOLVITE) 025 MCG tablet Take 400 mcg by mouth daily.    Marland Kitchen lisinopril-hydrochlorothiazide (PRINZIDE,ZESTORETIC) 10-12.5 MG per tablet Take 1 tablet by mouth daily.    . Multiple Vitamin (MULTIVITAMIN) tablet Take 1 tablet by mouth daily.    . Omega-3 Fatty Acids (FISH OIL) 875 MG CAPS Take by mouth.    . Potassium Citrate 15 MEQ (1620 MG) TBCR Take 2 tablets by mouth 2 (two) times daily.  6  . PRESCRIPTION MEDICATION once a week. Allergy shot on mondays    . saw palmetto 500 MG capsule Take 500 mg by mouth daily.    . sodium chloride (MURO 128) 5 % ophthalmic ointment 1 application.    . tamsulosin (FLOMAX) 0.4  MG CAPS capsule Take 0.4 mg by mouth.    . Ubiquinol 100 MG CAPS Take by mouth.    . vitamin E 100 UNIT capsule Take by mouth daily.     No current facility-administered medications for this visit.    Allergies:   Review of patient's allergies indicates no known allergies.    Social History:  The patient  reports that he has quit smoking. His smoking use included Cigarettes. He has a 18 pack-year smoking history. He has never used smokeless tobacco. He reports that he drinks alcohol. He reports that he does not use illicit drugs.   Family History:  The patient's family history includes COPD in his mother; Cancer - Other in his brother; Fibromyalgia in his brother; Heart attack (age of onset: 36) in his father; Lymphoma in his father; Stroke (age of onset:  78) in his mother.    ROS:  Please see the history of present illness.   Otherwise, review of systems are positive for none.   All other systems are reviewed and negative.    PHYSICAL EXAM: VS:  BP 132/62 mmHg  Pulse 80  Ht 5\' 8"  (1.727 m)  Wt 188 lb (85.276 kg)  BMI 28.59 kg/m2 , BMI Body mass index is 28.59 kg/(m^2). GENERAL:  Well appearing HEENT:  Pupils equal round and reactive, fundi not visualized, oral mucosa unremarkable NECK:  No jugular venous distention, waveform within normal limits, carotid upstroke brisk and symmetric, no bruits, no thyromegaly LYMPHATICS:  No cervical, inguinal adenopathy LUNGS:  Clear to auscultation bilaterally BACK:  No CVA tenderness CHEST:  Unremarkable HEART:  PMI not displaced or sustained,S1 and S2 within normal limits, no S3, no S4, no clicks, no rubs, no murmurs ABD:  Flat, positive bowel sounds normal in frequency in pitch, no bruits, no rebound, no guarding, no midline pulsatile mass, no hepatomegaly, no splenomegaly EXT:  2 plus pulses throughout, no edema, no cyanosis no clubbing SKIN:  No rashes no nodules NEURO:  Cranial nerves II through XII grossly intact, motor grossly intact throughout PSYCH:  Cognitively intact, oriented to person place and time    EKG:  EKG is ordered today. The ekg ordered today demonstrates sinus rhythm, rate 69, axis within normal limits, intervals within normal limits, no acute ST-T wave changes.    Wt Readings from Last 3 Encounters:  09/15/14 188 lb (85.276 kg)  03/03/14 190 lb 3.2 oz (86.274 kg)  08/30/13 188 lb (85.276 kg)      Other studies Reviewed: Additional studies/ records that were reviewed today include: CT images were personally reviewed, previous lab studies from an old chart. Outside urology records. Review of the above records demonstrates:  Please see elsewhere in the note.     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 He has no  claudication. I will get an aortic ultrasound to make sure there is no evidence of aneurysm. Otherwise he needs risk reduction.  DYSPNEA:  He does describe this and given his vascular disease and risk factors I would like to bring him back for a POET (Plain Old Exercise Treadmill)   Current medicines are reviewed at length with the patient today.  The patient does not have concerns regarding medicines.  The following changes have been made:  no change  Labs/ tests ordered today include:   Orders Placed This Encounter  Procedures  . Exercise Tolerance Test     Disposition:   FU with me as  needed.      Signed, Minus Breeding, MD  09/15/2014 9:45 AM    [place]

## 2014-09-15 NOTE — Patient Instructions (Signed)
Your physician recommends that you schedule a follow-up appointment in: one year with Dr. [name]  We have ordered a stress test and a doppler for you to get done

## 2014-09-16 ENCOUNTER — Encounter (HOSPITAL_COMMUNITY): Payer: Self-pay | Admitting: *Deleted

## 2014-09-18 ENCOUNTER — Ambulatory Visit (HOSPITAL_COMMUNITY)
Admission: RE | Admit: 2014-09-18 | Discharge: 2014-09-18 | Disposition: A | Discharge status: Home / Self Care | Payer: Medicare Other | Source: Ambulatory Visit | Attending: Urology | Admitting: Urology

## 2014-09-18 ENCOUNTER — Ambulatory Visit (HOSPITAL_COMMUNITY): Payer: Medicare Other

## 2014-09-18 ENCOUNTER — Encounter (HOSPITAL_COMMUNITY): Payer: Self-pay | Admitting: General Practice

## 2014-09-18 ENCOUNTER — Encounter (HOSPITAL_COMMUNITY)
Admission: RE | Disposition: A | Discharge status: Home / Self Care | Payer: Self-pay | Source: Ambulatory Visit | Attending: Urology

## 2014-09-18 DIAGNOSIS — Z8673 Personal history of transient ischemic attack (TIA), and cerebral infarction without residual deficits: Secondary | ICD-10-CM | POA: Insufficient documentation

## 2014-09-18 DIAGNOSIS — I10 Essential (primary) hypertension: Secondary | ICD-10-CM | POA: Insufficient documentation

## 2014-09-18 DIAGNOSIS — N200 Calculus of kidney: Secondary | ICD-10-CM | POA: Insufficient documentation

## 2014-09-18 DIAGNOSIS — Z7982 Long term (current) use of aspirin: Secondary | ICD-10-CM | POA: Diagnosis not present

## 2014-09-18 DIAGNOSIS — E780 Pure hypercholesterolemia: Secondary | ICD-10-CM | POA: Diagnosis not present

## 2014-09-18 DIAGNOSIS — Z79899 Other long term (current) drug therapy: Secondary | ICD-10-CM | POA: Insufficient documentation

## 2014-09-18 DIAGNOSIS — Z87891 Personal history of nicotine dependence: Secondary | ICD-10-CM | POA: Insufficient documentation

## 2014-09-18 DIAGNOSIS — Z87442 Personal history of urinary calculi: Secondary | ICD-10-CM | POA: Diagnosis not present

## 2014-09-18 DIAGNOSIS — Z85820 Personal history of malignant melanoma of skin: Secondary | ICD-10-CM | POA: Diagnosis not present

## 2014-09-18 SURGERY — LITHOTRIPSY, ESWL
Anesthesia: LOCAL | Laterality: Right

## 2014-09-18 MED ORDER — CIPROFLOXACIN HCL 500 MG PO TABS
500.0000 mg | ORAL_TABLET | ORAL | Status: AC
  Administered 2014-09-18: 500 mg via ORAL
  Filled 2014-09-18: qty 1

## 2014-09-18 MED ORDER — SODIUM CHLORIDE 0.9 % IV SOLN
INTRAVENOUS | Status: DC
  Administered 2014-09-18: 08:00:00 via INTRAVENOUS

## 2014-09-18 MED ORDER — DIPHENHYDRAMINE HCL 25 MG PO CAPS
25.0000 mg | ORAL_CAPSULE | ORAL | Status: AC
  Administered 2014-09-18: 25 mg via ORAL
  Filled 2014-09-18: qty 1

## 2014-09-18 MED ORDER — DIAZEPAM 5 MG PO TABS
10.0000 mg | ORAL_TABLET | ORAL | Status: AC
  Administered 2014-09-18: 10 mg via ORAL
  Filled 2014-09-18: qty 2

## 2014-09-18 NOTE — Addendum Note (Signed)
Addended by: Dolan Amen. on: 09/18/2014 07:09 AM   Modules accepted: Orders

## 2014-09-18 NOTE — H&P (Signed)
Reason For Visit 6 mo f/u, CT & review 24hr urine results   Active Problems Problems  1. Nephrolithiasis (N20.0)   Assessed By: Carolan Clines (Urology); Last Assessed: 10 Sep 2014  History of Present Illness     74 yo male returns today for a 6 mo f/u, CT & to review 24hr urine results for hx of bilateral kidney stones. He has a history of stones and recently underwent cystoscopy with a left stent and shock wave lithotripsy on 07/04/13 of a 13.43mm Lt UPJ stone. He is s/p cysto/Lt RGP/Lt JJ stent on 06/11/13. JJ stent was removed on 07/16/13.    Hx of a 3.70mm Rt distal ureteral stone. Patient has since passed the stone. Hx of Ca/oxlate stone formation.   Past Medical History Problems  1. History of hypercholesterolemia (Z86.39) 2. History of hypertension (Z86.79) 3. History of transient cerebral ischemia (Z86.73) 4. History of Malignant Melanoma Of The Skin  Surgical History Problems  1. History of Cystoscopy With Manipulation Of Ureteral Calculus 2. History of Gallbladder Surgery 3. History of Lithotripsy 4. History of Skin Debridement 5. History of Surg Prostate Transureth Dest Tissue Microwave  Thermotherapy  Current Meds 1. Aspirin 81 MG TABS;  Therapy: (Recorded:18May2016) to Recorded 2. Centrum TABS;  Therapy: (Recorded:04Nov2008) to Recorded 3. Folic Acid 786 MCG Oral Tablet;  Therapy: (Recorded:04Nov2008) to Recorded 4. Lisinopril 20 MG Oral Tablet;  Therapy: 03Sep2014 to Recorded 5. Potassium Citrate ER 15 MEQ (1620 MG) Oral Tablet Extended Release;  TAKE 2 TABLET Twice daily;  Therapy: [id number] to (Evaluate:08Jun2016)  Requested for: [id number];  Last Rx:11Nov2015 Ordered 6. Tamsulosin HCl - 0.4 MG Oral Capsule; TAKE ONE CAPSULE BY  MOUTH EVERY DAY;  Therapy: [id number] to (Evaluate:02Oct2016)  Requested for: [id number];  Last NT:[id number] Ordered 7. Vitamin B Complex CAPS;  Therapy: (Recorded:29Jun2010) to Recorded 8. Vitamin D TABS;  Therapy:  (Recorded:04Nov2008) to Recorded 9. Zyrtec TABS;  Therapy: (Recorded:13Feb2015) to Recorded  Allergies Medication  1. No Known Drug Allergies  Family History Problems  1. Family history of Esophageal Cancer : Brother 2. Family history of Family Health Status Number Of Children   1 son, 2 daughters 3. Family history of Father Deceased At Age ____   62m hodgkins disease 4. Family history of Mother Deceased At Age ____   3, chf  Social History Problems  1. Former Smoker   1 ppd x 30 yrs, quit 10 yrs ago 2. Marital History - Currently Married 3. Occupation:   retired  Review of Systems Genitourinary, constitutional, skin, eye, otolaryngeal, hematologic/lymphatic, cardiovascular, pulmonary, endocrine, musculoskeletal, gastrointestinal, neurological and psychiatric system(s) were reviewed and pertinent findings if present are noted and are otherwise negative.  Genitourinary: hematuria, but no urinary frequency, no feelings of urinary urgency, no dysuria, no incontinence, no difficulty starting the urinary stream, urine stream is not weak, urinary stream does not start and stop, no incomplete emptying of bladder, no post-void dribbling, no testicular pain and initiating urination does not require straining.  Gastrointestinal: constipation, but no vomiting, no heartburn, no diarrhea and no melena.    Vitals Vital Signs [Data Includes: Last 1 Day]  Recorded: [id number] 02:38PM  Blood Pressure: 158 / 73 Temperature: 98.4 F Heart Rate: 76  Physical Exam Constitutional: Well nourished and well developed . No acute distress.  ENT:. The ears and nose are normal in appearance.  Neck: The appearance of the neck is normal and no neck mass is present.  Pulmonary: No respiratory distress and normal respiratory  rhythm and effort.  Cardiovascular: Heart rate and rhythm are normal . No peripheral edema.  Abdomen: The abdomen is soft and nontender. No masses are palpated. No CVA tenderness.  No hernias are palpable. No hepatosplenomegaly noted.  Genitourinary: Examination of the penis demonstrates no discharge, no masses, no lesions and a normal meatus. The scrotum is without lesions. The right epididymis is palpably normal and non-tender. The left epididymis is palpably normal and non-tender. The right testis is non-tender and without masses. The left testis is non-tender and without masses.  Lymphatics: The femoral and inguinal nodes are not enlarged or tender.  Skin: Normal skin turgor, no visible rash and no visible skin lesions.  Neuro/Psych:. Mood and affect are appropriate.    Results/Data Urine [Data Includes: Last 1 Day]   [id number]  COLOR YELLOW   APPEARANCE CLEAR   SPECIFIC GRAVITY <1.005   pH 7.0   GLUCOSE NEG mg/dL  BILIRUBIN NEG   KETONE NEG mg/dL  BLOOD NEG   PROTEIN NEG mg/dL  UROBILINOGEN 0.2 mg/dL  NITRITE NEG   LEUKOCYTE ESTERASE NEG    CT reviewed: bilateral nephrolithiasis with 68mm RLP stone, with 539 HU , and 96.23mm from stone to skin edge. Pt is reasonable candidate for lithotripsy. On ASA 81mg  ( will stop pre-op).   Assessment Assessed  1. Nephrolithiasis (W09.8)  Metabolically active stone former. Discussed lithotripsy for 39mm stone in RLP ( 539 HU and 96.61mm from stone to skin. He is a good candidate for lithotripsy.   IN addition, we have reviewed his 2r hr urine, and he will begin allopurinol, 100mg  and lisinopril 10/HCTZ 12.5mg . Repeat litholink in 6 minths. Continue potassium citrate.   Plan Nephrolithiasis  1. Start: Allopurinol 100 MG Oral Tablet; TAKE 1 TABLET DAILY AS  DIRECTED 2. Start: Lisinopril-Hydrochlorothiazide 10-12.5 MG Oral Tablet; TAKE 1  TABLET DAILY 3. Renew: Potassium Citrate ER 15 MEQ (1620 MG) Oral Tablet Extended  Release (Urocit-K 15); TAKE 2 TABLET Twice daily  1.lithotripsy for Right lower pole stone  2. Begin lisinopril/HCTZ ( 10/12.5)and allopurinol 100mg /day.   3, Continue potassium citrate  4. Take  CT disk to Dr Ellyn Hack to see the ASVD in Ao.   Discussion/Summary cc: Dr. Jeanann Lewandowsky   cc: Dr. Elvina Mattes : copy of disk with pt to take to Dr. Faythe Casa Electronically signed by : Carolan Clines, M.D.; Sep 10 2014  3:05PM EST

## 2014-09-18 NOTE — Interval H&P Note (Signed)
History and Physical Interval Note:  09/18/2014 8:53 AM  Ross [name].  has presented today for surgery, with the diagnosis of RIGHT LOWER POLE STONE  The various methods of treatment have been discussed with the patient and family. After consideration of risks, benefits and other options for treatment, the patient has consented to  Procedure(s): RIGHT EXTRACORPOREAL SHOCK WAVE LITHOTRIPSY (ESWL) (Right) as a surgical intervention .  The patient's history has been reviewed, patient examined, no change in status, stable for surgery.  I have reviewed the patient's chart and labs.  Questions were answered to the patient's satisfaction.   S: 64mm Right Lower pole kidney stone  O: HU= 539       Skin to stone edge= 80mm KUB evaluated, Right side marked.   Kendrick Remigio I Canden Cieslinski

## 2014-09-18 NOTE — Discharge Instructions (Signed)
Lithotripsy for Kidney Stones °Lithotripsy is a treatment that can sometimes help eliminate kidney stones and pain that they cause. A form of lithotripsy, also known as extracorporeal shock wave lithotripsy, is a nonsurgical procedure that helps your body rid itself of the kidney stone when it is too big to pass on its own. Extracorporeal shock wave lithotripsy is a method of crushing a kidney stone with shock waves. These shock waves pass through your body and are focused on your stone. They cause the kidney stones to crumble while still in the urinary tract. It is then easier for the smaller pieces of stone to pass in the urine. °Lithotripsy usually takes about an hour. It is done in a hospital, a lithotripsy center, or a mobile unit. It usually does not require an overnight stay. Your health care provider will instruct you on preparation for the procedure. Your health care provider will tell you what to expect afterward. °LET YOUR HEALTH CARE PROVIDER KNOW ABOUT: °· Any allergies you have. °· All medicines you are taking, including vitamins, herbs, eye drops, creams, and over-the-counter medicines. °· Previous problems you or members of your family have had with the use of anesthetics. °· Any blood disorders you have. °· Previous surgeries you have had. °· Medical conditions you have. °RISKS AND COMPLICATIONS °Generally, lithotripsy for kidney stones is a safe procedure. However, as with any procedure, complications can occur. Possible complications include: °· Infection. °· Bleeding of the kidney. °· Bruising of the kidney or skin. °· Obstruction of the ureter. °· Failure of the stone to fragment. °BEFORE THE PROCEDURE °· Do not eat or drink for 6-8 hours prior to the procedure. You may, however, take the medications with a sip of water that your physician instructs you to take °· Do not take aspirin or aspirin-containing products for 7 days prior to your procedure °· Do not take nonsteroidal anti-inflammatory  products for 7 days prior to your procedure °PROCEDURE °A stent (flexible tube with holes) may be placed in your ureter. The ureter is the tube that transports the urine from the kidneys to the bladder. Your health care provider may place a stent before the procedure. This will help keep urine flowing from the kidney if the fragments of the stone block the ureter. You may have an IV tube placed in one of your veins to give you fluids and medicines. These medicines may help you relax or make you sleep. During the procedure, you will lie comfortably on a fluid-filled cushion or in a warm-water bath. After an X-ray or ultrasound exam to locate your stone, shock waves are aimed at the stone. If you are awake, you may feel a tapping sensation as the shock waves pass through your body. If large stone particles remain after treatment, a second procedure may be necessary at a later date. °For comfort during the test: °· Relax as much as possible. °· Try to remain still as much as possible. °· Try to follow instructions to speed up the test. °· Let your health care provider know if you are uncomfortable, anxious, or in pain. °AFTER THE PROCEDURE  °After surgery, you will be taken to the recovery area. A nurse will watch and check your progress. Once you're awake, stable, and taking fluids well, you will be allowed to go home as long as there are no problems. You will also be allowed to pass your urine before discharge. You may be given antibiotics to help prevent infection. You may also be prescribed   pain medicine if needed. In a week or two, your health care provider may remove your stent, if you have one. You may first have an X-ray exam to check on how successful the fragmentation of your stone has been and how much of the stone has passed. Your health care provider will check to see whether or not stone particles remain. °SEEK IMMEDIATE MEDICAL CARE IF: °· You develop a fever or shaking chills. °· Your pain is not  relieved by medicine. °· You feel sick to your stomach (nauseated) and you vomit. °· You develop heavy bleeding. °· You have difficulty urinating. °· You start to pass your stent from your penis. °Document Released: 04/08/2000 Document Revised: 01/30/2013 Document Reviewed: 10/25/2012 °[employer]® Patient Information ©2015 [employer]. This information is not intended to replace advice given to you by your health care provider. Make sure you discuss any questions you have with your health care provider. ° °

## 2014-10-15 ENCOUNTER — Telehealth (HOSPITAL_COMMUNITY): Payer: Self-pay

## 2014-10-15 NOTE — Telephone Encounter (Signed)
Encounter complete. 

## 2014-10-17 ENCOUNTER — Ambulatory Visit (HOSPITAL_COMMUNITY)
Admission: RE | Admit: 2014-10-17 | Discharge: 2014-10-17 | Disposition: A | Discharge status: Home / Self Care | Payer: Medicare Other | Source: Ambulatory Visit | Attending: Cardiovascular Disease | Admitting: Cardiovascular Disease

## 2014-10-17 ENCOUNTER — Ambulatory Visit (HOSPITAL_BASED_OUTPATIENT_CLINIC_OR_DEPARTMENT_OTHER)
Admission: RE | Admit: 2014-10-17 | Discharge: 2014-10-17 | Disposition: A | Discharge status: Home / Self Care | Payer: Medicare Other | Source: Ambulatory Visit | Attending: Cardiology | Admitting: Cardiology

## 2014-10-17 DIAGNOSIS — I700 Atherosclerosis of aorta: Secondary | ICD-10-CM

## 2014-10-17 LAB — EXERCISE TOLERANCE TEST
CHL CUP MPHR: 148 {beats}/min
CSEPEDS: 0 s
CSEPEW: 8.5 METS
CSEPPHR: 133 {beats}/min
Exercise duration (min): 7 min
Percent HR: 89 %
RPE: 15
Rest HR: 66 {beats}/min

## 2014-10-20 ENCOUNTER — Other Ambulatory Visit: Payer: Self-pay

## 2015-03-21 IMAGING — CR DG CHEST 2V
2 series · 2 of 2 positions shown · non-contrast
Comparison: 01/19/2009

CLINICAL DATA: Cough.  Crackles in the right middle lobe.

EXAM:
CHEST  2 VIEW

[view not recorded (1 of 2)]
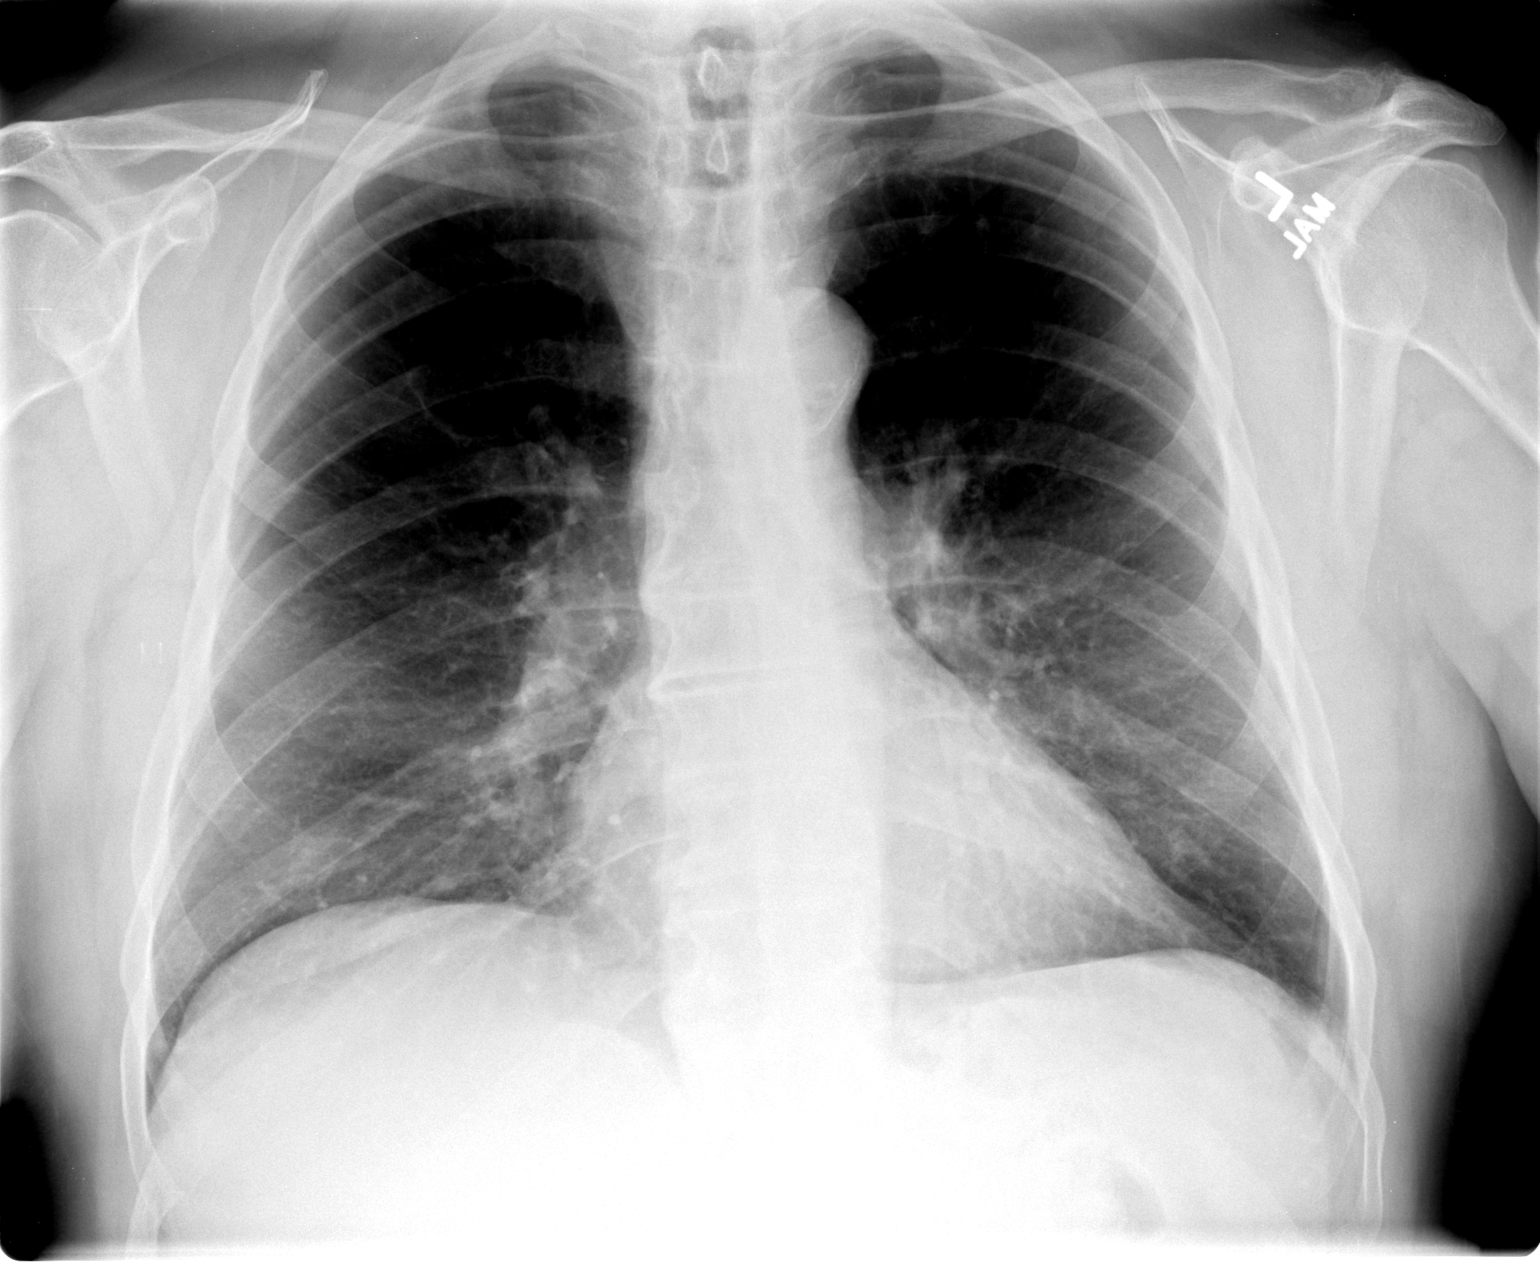

[view not recorded (2 of 2)]
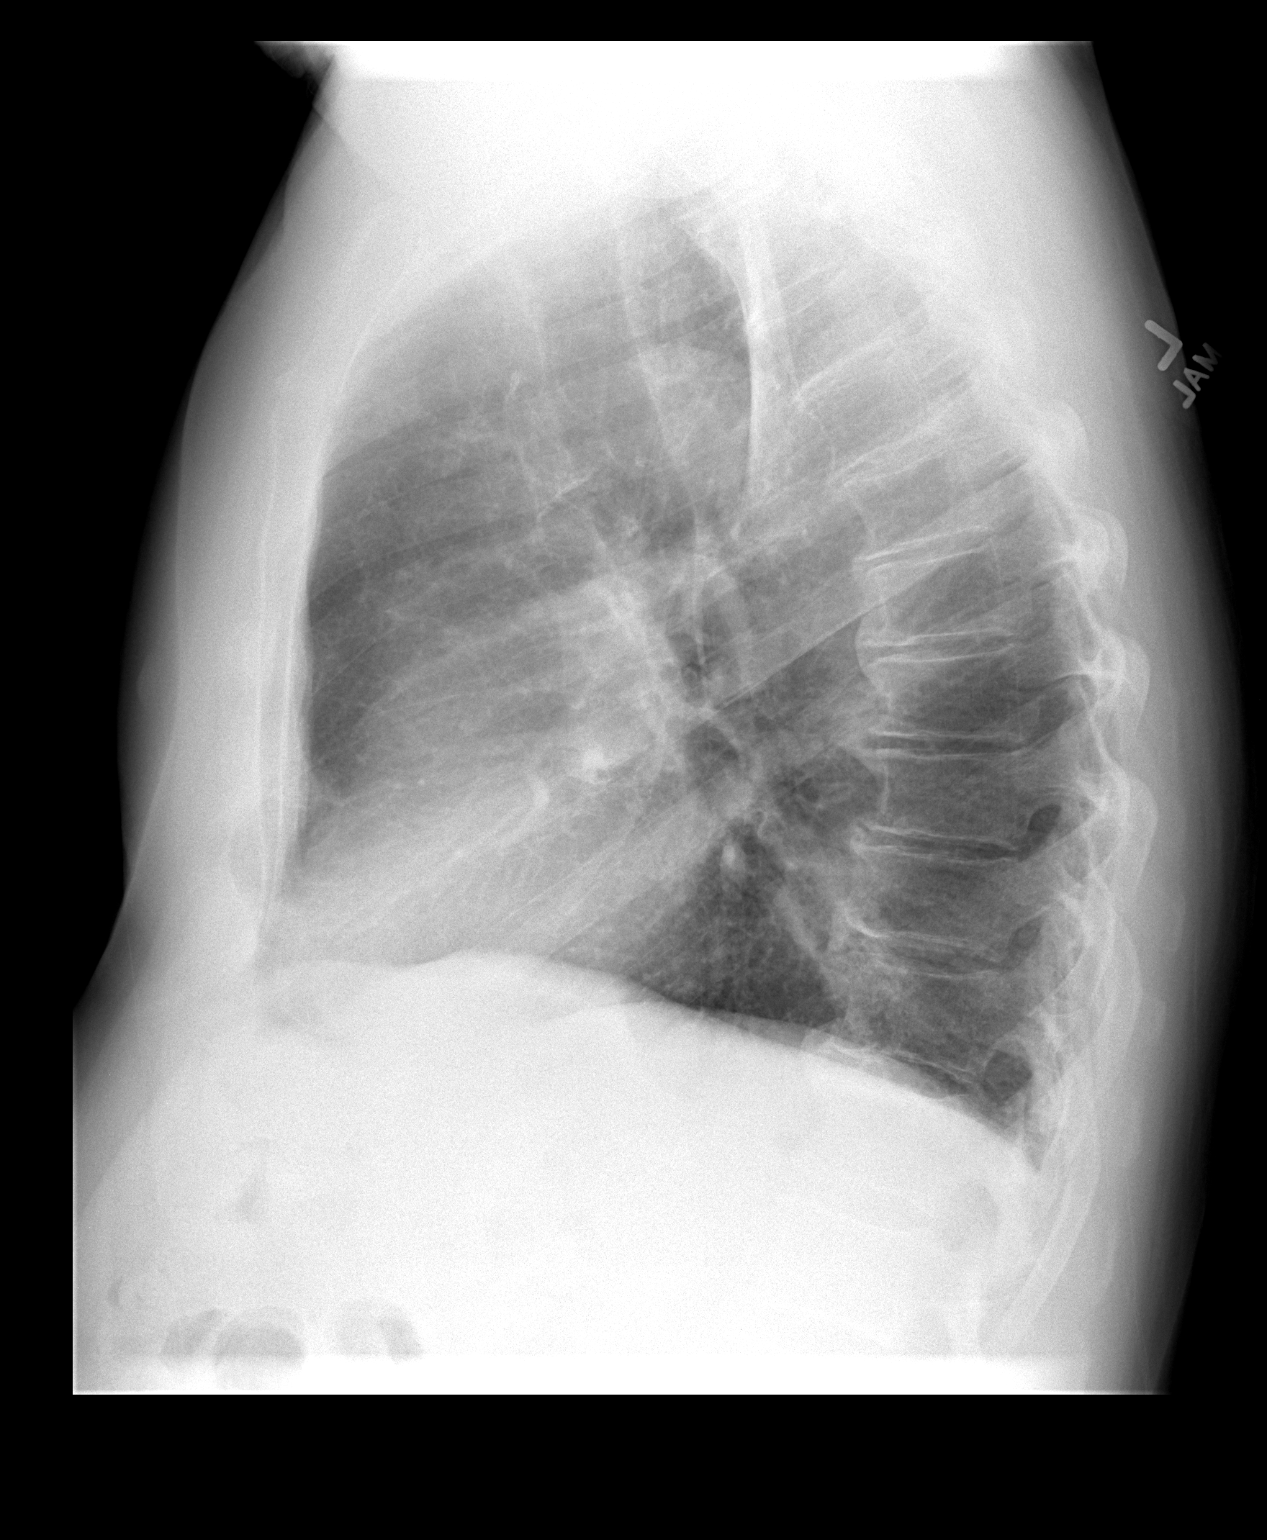

[2 of 2 positions shown; findings below may reference images not displayed]

FINDINGS: Provided medical history indicates the patient has never been a
smoker, although some pulmonary findings are supportive of possible
emphysema.

Thoracic spondylosis. No pneumonia observed. Atherosclerotic
calcification in the aortic arch.
IMPRESSION: 1. Atherosclerosis.
2. Thoracic spondylosis.
3. Possible emphysema.

## 2015-04-30 DIAGNOSIS — J301 Allergic rhinitis due to pollen: Secondary | ICD-10-CM | POA: Diagnosis not present

## 2015-04-30 DIAGNOSIS — J3089 Other allergic rhinitis: Secondary | ICD-10-CM | POA: Diagnosis not present

## 2015-04-30 DIAGNOSIS — M25561 Pain in right knee: Secondary | ICD-10-CM | POA: Diagnosis not present

## 2015-05-22 DIAGNOSIS — J3089 Other allergic rhinitis: Secondary | ICD-10-CM | POA: Diagnosis not present

## 2015-05-22 DIAGNOSIS — J301 Allergic rhinitis due to pollen: Secondary | ICD-10-CM | POA: Diagnosis not present

## 2015-05-28 DIAGNOSIS — C44319 Basal cell carcinoma of skin of other parts of face: Secondary | ICD-10-CM | POA: Diagnosis not present

## 2015-06-03 DIAGNOSIS — M25561 Pain in right knee: Secondary | ICD-10-CM | POA: Diagnosis not present

## 2015-06-05 DIAGNOSIS — J301 Allergic rhinitis due to pollen: Secondary | ICD-10-CM | POA: Diagnosis not present

## 2015-06-05 DIAGNOSIS — J3089 Other allergic rhinitis: Secondary | ICD-10-CM | POA: Diagnosis not present

## 2015-06-09 DIAGNOSIS — J301 Allergic rhinitis due to pollen: Secondary | ICD-10-CM | POA: Diagnosis not present

## 2015-06-09 DIAGNOSIS — J3089 Other allergic rhinitis: Secondary | ICD-10-CM | POA: Diagnosis not present

## 2015-06-29 DIAGNOSIS — Z125 Encounter for screening for malignant neoplasm of prostate: Secondary | ICD-10-CM | POA: Diagnosis not present

## 2015-06-29 DIAGNOSIS — M25519 Pain in unspecified shoulder: Secondary | ICD-10-CM | POA: Diagnosis not present

## 2015-06-29 DIAGNOSIS — I10 Essential (primary) hypertension: Secondary | ICD-10-CM | POA: Diagnosis not present

## 2015-06-29 DIAGNOSIS — R51 Headache: Secondary | ICD-10-CM | POA: Diagnosis not present

## 2015-06-29 DIAGNOSIS — Z0001 Encounter for general adult medical examination with abnormal findings: Secondary | ICD-10-CM | POA: Diagnosis not present

## 2015-07-02 DIAGNOSIS — J301 Allergic rhinitis due to pollen: Secondary | ICD-10-CM | POA: Diagnosis not present

## 2015-07-02 DIAGNOSIS — J3081 Allergic rhinitis due to animal (cat) (dog) hair and dander: Secondary | ICD-10-CM | POA: Diagnosis not present

## 2015-07-02 DIAGNOSIS — H1045 Other chronic allergic conjunctivitis: Secondary | ICD-10-CM | POA: Diagnosis not present

## 2015-07-02 DIAGNOSIS — J3089 Other allergic rhinitis: Secondary | ICD-10-CM | POA: Diagnosis not present

## 2015-07-13 DIAGNOSIS — J301 Allergic rhinitis due to pollen: Secondary | ICD-10-CM | POA: Diagnosis not present

## 2015-07-13 DIAGNOSIS — J3089 Other allergic rhinitis: Secondary | ICD-10-CM | POA: Diagnosis not present

## 2015-08-11 DIAGNOSIS — J3089 Other allergic rhinitis: Secondary | ICD-10-CM | POA: Diagnosis not present

## 2015-08-11 DIAGNOSIS — J301 Allergic rhinitis due to pollen: Secondary | ICD-10-CM | POA: Diagnosis not present

## 2015-09-25 NOTE — Progress Notes (Signed)
Cardiology Office Note   Date:  09/28/2015   ID:  Ross [name]., DOB Sep 07, 1940, MRN [id number]  PCP:  Foye Spurling, MD  Cardiologist:   Minus Breeding, MD   Chief Complaint  Patient presents with  . PVD      History of Present Illness: Ross [name]. is a 75 y.o. male who presents for evaluation of aortic calcification. He's being evaluated for kidney stone and had a CT. He had some calcium in his aorta down into his iliacs.  He was seen here previously and I did see his most recent echo in 2012 demonstrated normal left ventricular function with some mild aortic valve calcification. He's had minimal plaque on carotid Dopplers. Stress perfusion study in 2008 demonstrated no evidence of ischemia. POET (Plain Old Exercise Treadmill) last year had some nonspecific ST changes but no clear evidence of ischemia.  He returns for yearly follow up.  Since I last saw him he has done well.  He exercises routinely waking daily.  The patient denies any new symptoms such as chest discomfort, neck or arm discomfort. There has been no new shortness of breath, PND or orthopnea. There have been no reported palpitations, presyncope or syncope.  Past Medical History  Diagnosis Date  . Hypertension   . Kidney stones   . Stroke ([place]) 2005  . Essential and other specified forms of tremor 08/30/2013  . 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 Cholecystectomy    . Knee surgery      bilateral arthroscopic  . Shoulder surgery      right   . Cystoscopy with retrograde pyelogram, ureteroscopy and stent placement Left 06/11/2013    Procedure: CYSTOSCOPY WITH RETROGRADE Left PYELOGRAM, URETEROSCOPY AND Left STENT PLACEMENT;  Surgeon: Ailene Rud, MD;  Location: [facility];  Service: Urology;  Laterality: Left;  . Cataract extraction      bilateral  . Appendectomy       Current Outpatient Prescriptions  Medication Sig Dispense Refill    . acetaminophen (TYLENOL) 500 MG tablet Take 1,000 mg by mouth every 6 (six) hours as needed for moderate pain or headache.    . allopurinol (ZYLOPRIM) 100 MG tablet Take 100 mg by mouth at bedtime.     . Artificial Tear Ointment (DRY EYES OP) Place 1 drop into both eyes 2 (two) times daily.    Marland Kitchen b complex vitamins tablet Take 1 tablet by mouth every morning.     . cetirizine (ZYRTEC) 10 MG tablet Take 10 mg by mouth every morning.     . cholecalciferol (VITAMIN D) 1000 UNITS tablet Take 2,000 Units by mouth every morning.     Marland Kitchen FOLIC ACID PO Take [id number] mcg by mouth every morning.    Marland Kitchen ketoconazole (NIZORAL) 2 % cream Apply 1 application topically daily as needed for irritation.    Marland Kitchen lisinopril (PRINIVIL,ZESTRIL) 20 MG tablet Take 20 mg by mouth at bedtime.    Marland Kitchen lisinopril-hydrochlorothiazide (PRINZIDE,ZESTORETIC) 10-12.5 MG per tablet Take 1 tablet by mouth at bedtime.     . Multiple Vitamin (MULTIVITAMIN) tablet Take 1 tablet by mouth 2 (two) times daily. With lunch and at night.    . naproxen sodium (ANAPROX) 220 MG tablet Take 440 mg by mouth daily as needed (pain.).    Marland Kitchen OVER THE COUNTER MEDICATION Take 1 tablet by mouth 2 (two) times daily. Prostate  Optimizer.    . Potassium Citrate 15 MEQ (1620 MG) TBCR Take 2 tablets by mouth 2 (two) times daily.  6  . PRESCRIPTION MEDICATION 1 each by Subconjunctival route every 21 ( twenty-one) days. Allergy shot    . sodium chloride (MURO 128) 2 % ophthalmic solution Place 1 drop into both eyes 2 (two) times daily.    . tamsulosin (FLOMAX) 0.4 MG CAPS capsule Take 0.4 mg by mouth at bedtime.     Marland Kitchen Ubiquinol 100 MG CAPS Take 100 mg by mouth every morning.      No current facility-administered medications for this visit.    Allergies:   Review of patient's allergies indicates not on file.    ROS:  Please see the history of present illness.   Otherwise, review of systems are positive for [name].   All other systems are reviewed and negative.     PHYSICAL EXAM: VS:  BP 100/62 mmHg  Pulse 60  Ht 5\' 8"  (1.727 m)  Wt 187 lb (84.823 kg)  BMI 28.44 kg/m2 , BMI Body mass index is 28.44 kg/(m^2). GENERAL:  Well appearing HEENT:  Pupils equal round and reactive, fundi not visualized, oral mucosa unremarkable NECK:  No jugular venous distention, waveform within normal limits, carotid upstroke brisk and symmetric, possible soft right bruit, no thyromegaly LYMPHATICS:  No cervical, inguinal adenopathy LUNGS:  Clear to auscultation bilaterally BACK:  No CVA tenderness CHEST:  Unremarkable HEART:  PMI not displaced or sustained,S1 and S2 within normal limits, no S3, no S4, no clicks, no rubs, no murmurs ABD:  Flat, positive bowel sounds normal in frequency in pitch, no bruits, no rebound, no guarding, no midline pulsatile mass, no hepatomegaly, no splenomegaly EXT:  2 plus pulses throughout, no edema, no cyanosis no clubbing SKIN:  No rashes no nodules NEURO:  Cranial nerves II through XII grossly intact, motor grossly intact throughout PSYCH:  Cognitively intact, oriented to person place and time    EKG:  EKG is ordered today. The ekg ordered today demonstrates sinus rhythm, rate 60, axis within normal limits, intervals within normal limits, no acute ST-T wave changes.    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 He has no claudication.  He had a negative AAA last year.  He will continue with risk reduction.  DYSPNEA:  This is not an issue.  I will plan a I will bring the patient back for a POET (Plain Old Exercise Test). This will allow me to screen for obstructive coronary disease, risk stratify and very  importantly provide a prescription for exercise next year.  BRUIT:  I will check a carotid Doppler  Current medicines are reviewed at length with the patient today.  The patient does not have concerns regarding medicines.  The following changes have been made:  no change  Labs/ tests ordered today include:     Orders Placed This Encounter  Procedures  . EXERCISE TOLERANCE TEST  . EKG 12-Lead     Disposition:   FU with me in one year.     Signed, Minus Breeding, MD  09/28/2015 8:24 AM    [place]

## 2015-09-28 ENCOUNTER — Encounter: Payer: Self-pay | Admitting: Cardiology

## 2015-09-28 ENCOUNTER — Ambulatory Visit (INDEPENDENT_AMBULATORY_CARE_PROVIDER_SITE_OTHER): Payer: PPO | Admitting: Cardiology

## 2015-09-28 VITALS — BP 100/62 | HR 60 | Ht 68.0 in | Wt 187.0 lb

## 2015-09-28 DIAGNOSIS — I700 Atherosclerosis of aorta: Secondary | ICD-10-CM | POA: Insufficient documentation

## 2015-09-28 DIAGNOSIS — R0989 Other specified symptoms and signs involving the circulatory and respiratory systems: Secondary | ICD-10-CM | POA: Diagnosis not present

## 2015-09-28 DIAGNOSIS — I2510 Atherosclerotic heart disease of native coronary artery without angina pectoris: Secondary | ICD-10-CM

## 2015-09-28 NOTE — Patient Instructions (Signed)
Medication Instructions:  Continue current medications  Labwork: NONE  Testing/Procedures: Your physician has requested that you have a carotid duplex. This test is an ultrasound of the carotid arteries in your neck. It looks at blood flow through these arteries that supply the brain with blood. Allow one hour for this exam. There are no restrictions or special instructions.  Your physician has requested that you have an exercise tolerance test in 1 Year. For further information please visit [email or web address]. Please also follow instruction sheet, as given.  Follow-Up: Your physician wants you to follow-up in: 1 Year. You will receive a reminder letter in the mail two months in advance. If you don't receive a letter, please call our office to schedule the follow-up appointment.   Any Other Special Instructions Will Be Listed Below (If Applicable).   If you need a refill on your cardiac medications before your next appointment, please call your pharmacy.

## 2015-09-30 DIAGNOSIS — J3089 Other allergic rhinitis: Secondary | ICD-10-CM | POA: Diagnosis not present

## 2015-09-30 DIAGNOSIS — J301 Allergic rhinitis due to pollen: Secondary | ICD-10-CM | POA: Diagnosis not present

## 2015-10-09 ENCOUNTER — Ambulatory Visit (HOSPITAL_COMMUNITY)
Admission: RE | Admit: 2015-10-09 | Discharge: 2015-10-09 | Disposition: A | Discharge status: Home / Self Care | Payer: PPO | Source: Ambulatory Visit | Attending: Cardiovascular Disease | Admitting: Cardiovascular Disease

## 2015-10-09 DIAGNOSIS — I10 Essential (primary) hypertension: Secondary | ICD-10-CM | POA: Insufficient documentation

## 2015-10-09 DIAGNOSIS — I6523 Occlusion and stenosis of bilateral carotid arteries: Secondary | ICD-10-CM | POA: Diagnosis not present

## 2015-10-09 DIAGNOSIS — R0989 Other specified symptoms and signs involving the circulatory and respiratory systems: Secondary | ICD-10-CM | POA: Insufficient documentation

## 2015-10-20 DIAGNOSIS — J301 Allergic rhinitis due to pollen: Secondary | ICD-10-CM | POA: Diagnosis not present

## 2015-10-20 DIAGNOSIS — J3089 Other allergic rhinitis: Secondary | ICD-10-CM | POA: Diagnosis not present

## 2015-11-03 ENCOUNTER — Encounter (HOSPITAL_COMMUNITY): Payer: Self-pay | Admitting: *Deleted

## 2015-11-03 DIAGNOSIS — Z85828 Personal history of other malignant neoplasm of skin: Secondary | ICD-10-CM | POA: Diagnosis not present

## 2015-11-03 DIAGNOSIS — Z87891 Personal history of nicotine dependence: Secondary | ICD-10-CM | POA: Diagnosis not present

## 2015-11-03 DIAGNOSIS — Z8673 Personal history of transient ischemic attack (TIA), and cerebral infarction without residual deficits: Secondary | ICD-10-CM | POA: Diagnosis not present

## 2015-11-03 DIAGNOSIS — K625 Hemorrhage of anus and rectum: Secondary | ICD-10-CM | POA: Insufficient documentation

## 2015-11-03 DIAGNOSIS — I10 Essential (primary) hypertension: Secondary | ICD-10-CM | POA: Diagnosis not present

## 2015-11-03 DIAGNOSIS — Z79899 Other long term (current) drug therapy: Secondary | ICD-10-CM | POA: Insufficient documentation

## 2015-11-03 LAB — COMPREHENSIVE METABOLIC PANEL
ALBUMIN: 3.9 g/dL (ref 3.5–5.0)
ALT: 28 U/L (ref 17–63)
ANION GAP: 8 (ref 5–15)
AST: 33 U/L (ref 15–41)
Alkaline Phosphatase: 48 U/L (ref 38–126)
BUN: 14 mg/dL (ref 6–20)
CO2: 24 mmol/L (ref 22–32)
Calcium: 8.9 mg/dL (ref 8.9–10.3)
Chloride: 107 mmol/L (ref 101–111)
Creatinine, Ser: 1.03 mg/dL (ref 0.61–1.24)
GFR calc Af Amer: >60 mL/min (ref >60)
GFR calc non Af Amer: >60 mL/min (ref >60)
GLUCOSE: 119 mg/dL — AB (ref 65–99)
Potassium: 4 mmol/L (ref 3.5–5.1)
Sodium: 139 mmol/L (ref 135–145)
Total Bilirubin: 0.6 mg/dL (ref 0.3–1.2)
Total Protein: 6.7 g/dL (ref 6.5–8.1)

## 2015-11-03 LAB — TYPE AND SCREEN
ABO/RH(D): O POS
ANTIBODY SCREEN: NEGATIVE

## 2015-11-03 LAB — CBC
HCT: 45.4 % (ref 39.0–52.0)
HEMOGLOBIN: 15.6 g/dL (ref 13.0–17.0)
MCH: 32.7 pg (ref 26.0–34.0)
MCHC: 34.4 g/dL (ref 30.0–36.0)
MCV: 95.2 fL (ref 78.0–100.0)
Platelets: 198 10*3/uL (ref 150–400)
RBC: 4.77 MIL/uL (ref 4.22–5.81)
RDW: 13.1 % (ref 11.5–15.5)
WBC: 6.5 10*3/uL (ref 4.0–10.5)

## 2015-11-03 NOTE — ED Notes (Signed)
Pt reports 3 bright red watery stools today. Pt denies shortness of breath, reports some dizziness. Onset was 40 minutes ago. Pt is not on blood thinners.

## 2015-11-03 NOTE — ED Notes (Addendum)
Dr Carlis Abbott, patients PCP, called checking on patient.  Would like a call when it is determined what is going to be done with patient  [phone]

## 2015-11-04 ENCOUNTER — Emergency Department (HOSPITAL_COMMUNITY)
Admission: EM | Admit: 2015-11-04 | Discharge: 2015-11-04 | Disposition: A | Discharge status: Home / Self Care | Payer: PPO | Attending: Emergency Medicine | Admitting: Emergency Medicine

## 2015-11-04 DIAGNOSIS — K625 Hemorrhage of anus and rectum: Secondary | ICD-10-CM

## 2015-11-04 DIAGNOSIS — Z86010 Personal history of colonic polyps: Secondary | ICD-10-CM | POA: Diagnosis not present

## 2015-11-04 LAB — HEMOGLOBIN AND HEMATOCRIT, BLOOD
HEMATOCRIT: 44 % (ref 39.0–52.0)
HEMATOCRIT: 42.6 % (ref 39.0–52.0)
Hemoglobin: 14.9 g/dL (ref 13.0–17.0)
Hemoglobin: 14.7 g/dL (ref 13.0–17.0)

## 2015-11-04 LAB — ABO/RH: ABO/RH(D): O POS

## 2015-11-04 LAB — POC OCCULT BLOOD, ED: Fecal Occult Bld: POSITIVE — AB

## 2015-11-04 NOTE — ED Provider Notes (Signed)
CSN: [id number]     Arrival date & time 11/03/15  2114 History  By signing my name below, I, Altamease Oiler, attest that this documentation has been prepared under the direction and in the presence of Delora Fuel, MD. Electronically Signed: Altamease Oiler, ED Scribe. 11/04/2015. 1:52 AM    Chief Complaint  Patient presents with  . Rectal Bleeding   The history is provided by the patient. No language interpreter was used.   Ross Klopf. is a 75 y.o. male who presents to the Emergency Department complaining of 3 episodes of bright red blood noted in his stool with onset approximately 7 hours ago after dinner. Pt states that he has been passing bright red watery stool. Associated symptoms include mild abdominal discomfort and lightheadedness. Pt denies Nausea and vomiting.  In the past he has been diagnosed with diverticulosis and tonight his PCP referred him to the ED. His last colonoscopy was less than 5 years ago and he reports that he had polyps at that time.   Past Medical History  Diagnosis Date  . Hypertension   . Kidney stones   . Stroke ([place]) 2005  . Essential and other specified forms of tremor 08/30/2013  . 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 Cholecystectomy    . Knee surgery      bilateral arthroscopic  . Shoulder surgery      right   . Cystoscopy with retrograde pyelogram, ureteroscopy and stent placement Left 06/11/2013    Procedure: CYSTOSCOPY WITH RETROGRADE Left PYELOGRAM, URETEROSCOPY AND Left STENT PLACEMENT;  Surgeon: Ailene Rud, MD;  Location: [facility];  Service: Urology;  Laterality: Left;  . Cataract extraction      bilateral  . Appendectomy     Family History  Problem Relation Age of Onset  . Stroke Mother 27  . COPD Mother   . Cancer - Other Brother     esophageal  . Fibromyalgia Brother   . Heart attack Father 31  . Lymphoma Father     died age 1   Social History  Substance Use  Topics  . Smoking status: Former Smoker -- 0.50 packs/day for 36 years    Types: Cigarettes    Quit date: 09/15/1996  . Smokeless tobacco: Never Used     Comment: Quit 20 years ago.   . Alcohol Use: 0.0 oz/week    0 Standard drinks or equivalent per week    Review of Systems  Gastrointestinal: Positive for abdominal pain, blood in stool and hematochezia.  Neurological: Positive for light-headedness.  All other systems reviewed and are negative.  Allergies  Review of patient's allergies indicates no known allergies.  Home Medications   Prior to Admission medications   Medication Sig Start Date End Date Taking? Authorizing Provider  acetaminophen (TYLENOL) 500 MG tablet Take 1,000 mg by mouth every 6 (six) hours as needed for moderate pain or headache.    Historical Provider, MD  allopurinol (ZYLOPRIM) 100 MG tablet Take 100 mg by mouth at bedtime.     Historical Provider, MD  Artificial Tear Ointment (DRY EYES OP) Place 1 drop into both eyes 2 (two) times daily.    Historical Provider, MD  b complex vitamins tablet Take 1 tablet by mouth every morning.     Historical Provider, MD  cetirizine (ZYRTEC) 10 MG tablet Take 10 mg by mouth every morning.     Historical Provider, MD  cholecalciferol (VITAMIN D) 1000 UNITS tablet Take 2,000 Units by mouth every morning.     Historical Provider, MD  FOLIC ACID PO Take [id number] mcg by mouth every morning.    Historical Provider, MD  ketoconazole (NIZORAL) 2 % cream Apply 1 application topically daily as needed for irritation.    Historical Provider, MD  lisinopril (PRINIVIL,ZESTRIL) 20 MG tablet Take 20 mg by mouth at bedtime.    Historical Provider, MD  lisinopril-hydrochlorothiazide (PRINZIDE,ZESTORETIC) 10-12.5 MG per tablet Take 1 tablet by mouth at bedtime.     Historical Provider, MD  Multiple Vitamin (MULTIVITAMIN) tablet Take 1 tablet by mouth 2 (two) times daily. With lunch and at night.    Historical Provider, MD  naproxen sodium  (ANAPROX) 220 MG tablet Take 440 mg by mouth daily as needed (pain.).    Historical Provider, MD  OVER THE COUNTER MEDICATION Take 1 tablet by mouth 2 (two) times daily. Prostate Optimizer.    Historical Provider, MD  Potassium Citrate 15 MEQ (1620 MG) TBCR Take 2 tablets by mouth 2 (two) times daily. 06/20/14   Historical Provider, MD  PRESCRIPTION MEDICATION 1 each by Subconjunctival route every 21 ( twenty-one) days. Allergy shot    Historical Provider, MD  sodium chloride (MURO 128) 2 % ophthalmic solution Place 1 drop into both eyes 2 (two) times daily.    Historical Provider, MD  tamsulosin (FLOMAX) 0.4 MG CAPS capsule Take 0.4 mg by mouth at bedtime.     Historical Provider, MD  Ubiquinol 100 MG CAPS Take 100 mg by mouth every morning.     Historical Provider, MD   BP 131/83 mmHg  Pulse 80  Temp(Src) 98.6 F (37 C) (Oral)  Resp 20  Ht 5\' 8"  (1.727 m)  Wt 186 lb 14.4 oz (84.777 kg)  BMI 28.42 kg/m2  SpO2 95% Physical Exam  Constitutional: He is oriented to person, place, and time. He appears well-developed and well-nourished.  HENT:  Head: Normocephalic and atraumatic.  Eyes: EOM are normal. Pupils are equal, round, and reactive to light.  Neck: Normal range of motion. Neck supple. No JVD present.  Cardiovascular: Normal rate, regular rhythm, normal heart sounds and intact distal pulses.   No murmur heard. Pulmonary/Chest: Effort normal and breath sounds normal. He has no wheezes. He has no rales. He exhibits no tenderness.  Abdominal: Soft. Bowel sounds are normal. He exhibits no distension and no mass. There is no tenderness.  Genitourinary:  Rectal: Normal sphincter tone. Moderate amount of bright red blood present in the rectal vault. No masses.  Musculoskeletal: Normal range of motion. He exhibits no edema.  Lymphadenopathy:    He has no cervical adenopathy.  Neurological: He is alert and oriented to person, place, and time. No cranial nerve deficit. He exhibits normal  muscle tone. Coordination normal.  Skin: Skin is warm and dry. No rash noted.  Psychiatric: He has a normal mood and affect. His behavior is normal. Judgment and thought content normal.  Nursing note and vitals reviewed.   ED Course  Procedures (including critical care time) DIAGNOSTIC STUDIES: Oxygen Saturation is 95% on RA,  normal by my interpretation.    COORDINATION OF CARE: 1:50 AM Discussed treatment plan which includes lab work with pt at bedside and pt agreed to plan.  Labs Review Results for orders placed or performed during the hospital encounter of 11/04/15  Comprehensive metabolic panel  Result Value Ref Range   Sodium 139 135 - 145 mmol/L   Potassium 4.0  3.5 - 5.1 mmol/L   Chloride 107 101 - 111 mmol/L   CO2 24 22 - 32 mmol/L   Glucose, Bld 119 (H) 65 - 99 mg/dL   BUN 14 6 - 20 mg/dL   Creatinine, Ser 1.03 0.61 - 1.24 mg/dL   Calcium 8.9 8.9 - 10.3 mg/dL   Total Protein 6.7 6.5 - 8.1 g/dL   Albumin 3.9 3.5 - 5.0 g/dL   AST 33 15 - 41 U/L   ALT 28 17 - 63 U/L   Alkaline Phosphatase 48 38 - 126 U/L   Total Bilirubin 0.6 0.3 - 1.2 mg/dL   GFR calc non Af Amer >60 >60 mL/min   GFR calc Af Amer >60 >60 mL/min   Anion gap 8 5 - 15  CBC  Result Value Ref Range   WBC 6.5 4.0 - 10.5 K/uL   RBC 4.77 4.22 - 5.81 MIL/uL   Hemoglobin 15.6 13.0 - 17.0 g/dL   HCT 45.4 39.0 - 52.0 %   MCV 95.2 78.0 - 100.0 fL   MCH 32.7 26.0 - 34.0 pg   MCHC 34.4 30.0 - 36.0 g/dL   RDW 13.1 11.5 - 15.5 %   Platelets 198 150 - 400 K/uL  Hemoglobin and hematocrit, blood  Result Value Ref Range   Hemoglobin 14.9 13.0 - 17.0 g/dL   HCT 44.0 39.0 - 52.0 %  Hemoglobin and hematocrit, blood  Result Value Ref Range   Hemoglobin 14.7 13.0 - 17.0 g/dL   HCT 42.6 39.0 - 52.0 %  POC occult blood, ED  Result Value Ref Range   Fecal Occult Bld POSITIVE (A) NEGATIVE  Type and screen [place]  Result Value Ref Range   ABO/RH(D) O POS    Antibody Screen NEG    Sample  Expiration 11/06/2015   ABO/Rh  Result Value Ref Range   ABO/RH(D) O POS    I have personally reviewed and evaluated these lab results as part of my medical decision-making.   MDM   Final diagnoses:  Rectal bleeding    Rectal bleeding. Currently, he is not showing signs of hemodynamic compromise. He has normal pulses and normal blood pressure and initial hemoglobin is normal. We'll check repeat hemoglobin and orthostatic vital signs. Old records are reviewed, and he did have a CT of abdomen and pelvisIn 2008 which showed diverticulosis. That would be the likely source of bleeding.  Hemoglobin did drop slightly to 14.9. Orthostatic vital signs did not show significant drop in blood pressure or rise in pulse. Hemoglobin was repeated again and is essentially stable at 14.7. He has not no further bowel movements while in the ED, so he was felt to be stable for discharge. I suspect that he had a spontaneous bleed from diverticulosis which was self-limited. He is instructed to return if bleeding should recur although, he should expect blood in his next bowel movement since he is known to blood in the rectal vault. Follow-up with PCP. Consider referral to gastroenterology for flexible sigmoidoscopy or colonoscopy.  I personally performed the services described in this documentation, which was scribed in my presence. The recorded information has been reviewed and is accurate.      Delora Fuel, MD [id number]

## 2015-11-04 NOTE — Discharge Instructions (Signed)
Return to the ED if you have significant amount of bleeding, developed abdominal pain, or dizziness or lightheadedness.  Gastrointestinal Bleeding Gastrointestinal (GI) bleeding means there is bleeding somewhere along the digestive tract, between the mouth and anus. CAUSES  There are many different problems that can cause GI bleeding. Possible causes include:  Esophagitis. This is inflammation, irritation, or swelling of the esophagus.  Hemorrhoids.These are veins that are full of blood (engorged) in the rectum. They cause pain, inflammation, and may bleed.  Anal fissures.These are areas of painful tearing which may bleed. They are often caused by passing hard stool.  Diverticulosis.These are pouches that form on the colon over time, with age, and may bleed significantly.  Diverticulitis.This is inflammation in areas with diverticulosis. It can cause pain, fever, and bloody stools, although bleeding is rare.  Polyps and cancer. Colon cancer often starts out as precancerous polyps.  Gastritis and ulcers.Bleeding from the upper gastrointestinal tract (near the stomach) may travel through the intestines and produce black, sometimes tarry, often bad smelling stools. In certain cases, if the bleeding is fast enough, the stools may not be black, but red. This condition may be life-threatening. SYMPTOMS   Vomiting bright red blood or material that looks like coffee grounds.  Bloody, black, or tarry stools. DIAGNOSIS  Your caregiver may diagnose your condition by taking your history and performing a physical exam. More tests may be needed, including:  X-rays and other imaging tests.  Esophagogastroduodenoscopy (EGD). This test uses a flexible, lighted tube to look at your esophagus, stomach, and small intestine.  Colonoscopy. This test uses a flexible, lighted tube to look at your colon. TREATMENT  Treatment depends on the cause of your bleeding.   For bleeding from the esophagus,  stomach, small intestine, or colon, the caregiver doing your EGD or colonoscopy may be able to stop the bleeding as part of the procedure.  Inflammation or infection of the colon can be treated with medicines.  Many rectal problems can be treated with creams, suppositories, or warm baths.  Surgery is sometimes needed.  Blood transfusions are sometimes needed if you have lost a lot of blood. If bleeding is slow, you may be allowed to go home. If there is a lot of bleeding, you will need to stay in the hospital for observation. HOME CARE INSTRUCTIONS   Take any medicines exactly as prescribed.  Keep your stools soft by eating foods that are high in fiber. These foods include whole grains, legumes, fruits, and vegetables. Prunes (1 to 3 a day) work well for many people.  Drink enough fluids to keep your urine clear or pale yellow. SEEK IMMEDIATE MEDICAL CARE IF:   Your bleeding increases.  You feel lightheaded, weak, or you faint.  You have severe cramps in your back or abdomen.  You pass large blood clots in your stool.  Your problems are getting worse. MAKE SURE YOU:   Understand these instructions.  Will watch your condition.  Will get help right away if you are not doing well or get worse.   This information is not intended to replace advice given to you by your health care provider. Make sure you discuss any questions you have with your health care provider.   Document Released: 04/08/2000 Document Revised: 03/28/2012 Document Reviewed: 09/29/2014 Elsevier Interactive Patient Education [employer].

## 2015-11-16 DIAGNOSIS — M25561 Pain in right knee: Secondary | ICD-10-CM | POA: Diagnosis not present

## 2015-11-16 DIAGNOSIS — K625 Hemorrhage of anus and rectum: Secondary | ICD-10-CM | POA: Diagnosis not present

## 2015-11-16 DIAGNOSIS — M1711 Unilateral primary osteoarthritis, right knee: Secondary | ICD-10-CM | POA: Diagnosis not present

## 2015-11-20 DIAGNOSIS — J301 Allergic rhinitis due to pollen: Secondary | ICD-10-CM | POA: Diagnosis not present

## 2015-11-20 DIAGNOSIS — J3089 Other allergic rhinitis: Secondary | ICD-10-CM | POA: Diagnosis not present

## 2015-11-30 DIAGNOSIS — K640 First degree hemorrhoids: Secondary | ICD-10-CM | POA: Diagnosis not present

## 2015-11-30 DIAGNOSIS — K625 Hemorrhage of anus and rectum: Secondary | ICD-10-CM | POA: Diagnosis not present

## 2015-11-30 DIAGNOSIS — K5730 Diverticulosis of large intestine without perforation or abscess without bleeding: Secondary | ICD-10-CM | POA: Diagnosis not present

## 2015-12-04 DIAGNOSIS — M25561 Pain in right knee: Secondary | ICD-10-CM | POA: Diagnosis not present

## 2015-12-14 DIAGNOSIS — J301 Allergic rhinitis due to pollen: Secondary | ICD-10-CM | POA: Diagnosis not present

## 2015-12-14 DIAGNOSIS — J3089 Other allergic rhinitis: Secondary | ICD-10-CM | POA: Diagnosis not present

## 2015-12-30 DIAGNOSIS — K648 Other hemorrhoids: Secondary | ICD-10-CM | POA: Diagnosis not present

## 2015-12-30 DIAGNOSIS — Z86010 Personal history of colonic polyps: Secondary | ICD-10-CM | POA: Diagnosis not present

## 2015-12-30 DIAGNOSIS — K5730 Diverticulosis of large intestine without perforation or abscess without bleeding: Secondary | ICD-10-CM | POA: Diagnosis not present

## 2016-01-04 DIAGNOSIS — J301 Allergic rhinitis due to pollen: Secondary | ICD-10-CM | POA: Diagnosis not present

## 2016-01-04 DIAGNOSIS — J3089 Other allergic rhinitis: Secondary | ICD-10-CM | POA: Diagnosis not present

## 2016-01-06 DIAGNOSIS — I10 Essential (primary) hypertension: Secondary | ICD-10-CM | POA: Diagnosis not present

## 2016-01-06 DIAGNOSIS — Z23 Encounter for immunization: Secondary | ICD-10-CM | POA: Diagnosis not present

## 2016-01-06 DIAGNOSIS — E7800 Pure hypercholesterolemia, unspecified: Secondary | ICD-10-CM | POA: Diagnosis not present

## 2016-01-06 DIAGNOSIS — I2510 Atherosclerotic heart disease of native coronary artery without angina pectoris: Secondary | ICD-10-CM | POA: Diagnosis not present

## 2016-01-07 DIAGNOSIS — J301 Allergic rhinitis due to pollen: Secondary | ICD-10-CM | POA: Diagnosis not present

## 2016-01-07 DIAGNOSIS — J3089 Other allergic rhinitis: Secondary | ICD-10-CM | POA: Diagnosis not present

## 2016-01-14 DIAGNOSIS — M23221 Derangement of posterior horn of medial meniscus due to old tear or injury, right knee: Secondary | ICD-10-CM | POA: Diagnosis not present

## 2016-01-14 DIAGNOSIS — S83281A Other tear of lateral meniscus, current injury, right knee, initial encounter: Secondary | ICD-10-CM | POA: Diagnosis not present

## 2016-01-14 DIAGNOSIS — M23251 Derangement of posterior horn of lateral meniscus due to old tear or injury, right knee: Secondary | ICD-10-CM | POA: Diagnosis not present

## 2016-01-14 DIAGNOSIS — G8918 Other acute postprocedural pain: Secondary | ICD-10-CM | POA: Diagnosis not present

## 2016-01-14 DIAGNOSIS — S83241A Other tear of medial meniscus, current injury, right knee, initial encounter: Secondary | ICD-10-CM | POA: Diagnosis not present

## 2016-01-14 DIAGNOSIS — M94261 Chondromalacia, right knee: Secondary | ICD-10-CM | POA: Diagnosis not present

## 2016-01-28 DIAGNOSIS — J3089 Other allergic rhinitis: Secondary | ICD-10-CM | POA: Diagnosis not present

## 2016-01-28 DIAGNOSIS — J301 Allergic rhinitis due to pollen: Secondary | ICD-10-CM | POA: Diagnosis not present

## 2016-02-01 DIAGNOSIS — J3089 Other allergic rhinitis: Secondary | ICD-10-CM | POA: Diagnosis not present

## 2016-02-01 DIAGNOSIS — J301 Allergic rhinitis due to pollen: Secondary | ICD-10-CM | POA: Diagnosis not present

## 2016-02-08 DIAGNOSIS — J301 Allergic rhinitis due to pollen: Secondary | ICD-10-CM | POA: Diagnosis not present

## 2016-02-08 DIAGNOSIS — J3089 Other allergic rhinitis: Secondary | ICD-10-CM | POA: Diagnosis not present

## 2016-02-12 DIAGNOSIS — J3089 Other allergic rhinitis: Secondary | ICD-10-CM | POA: Diagnosis not present

## 2016-02-12 DIAGNOSIS — J301 Allergic rhinitis due to pollen: Secondary | ICD-10-CM | POA: Diagnosis not present

## 2016-02-15 DIAGNOSIS — J301 Allergic rhinitis due to pollen: Secondary | ICD-10-CM | POA: Diagnosis not present

## 2016-02-15 DIAGNOSIS — J3081 Allergic rhinitis due to animal (cat) (dog) hair and dander: Secondary | ICD-10-CM | POA: Diagnosis not present

## 2016-02-15 DIAGNOSIS — J3089 Other allergic rhinitis: Secondary | ICD-10-CM | POA: Diagnosis not present

## 2016-02-17 DIAGNOSIS — M21612 Bunion of left foot: Secondary | ICD-10-CM | POA: Diagnosis not present

## 2016-02-17 DIAGNOSIS — M79672 Pain in left foot: Secondary | ICD-10-CM | POA: Diagnosis not present

## 2016-02-17 DIAGNOSIS — L851 Acquired keratosis [keratoderma] palmaris et plantaris: Secondary | ICD-10-CM | POA: Diagnosis not present

## 2016-03-07 DIAGNOSIS — Z23 Encounter for immunization: Secondary | ICD-10-CM | POA: Diagnosis not present

## 2016-03-07 DIAGNOSIS — Z85828 Personal history of other malignant neoplasm of skin: Secondary | ICD-10-CM | POA: Diagnosis not present

## 2016-03-07 DIAGNOSIS — Z86018 Personal history of other benign neoplasm: Secondary | ICD-10-CM | POA: Diagnosis not present

## 2016-03-07 DIAGNOSIS — C44319 Basal cell carcinoma of skin of other parts of face: Secondary | ICD-10-CM | POA: Diagnosis not present

## 2016-03-07 DIAGNOSIS — L821 Other seborrheic keratosis: Secondary | ICD-10-CM | POA: Diagnosis not present

## 2016-03-07 DIAGNOSIS — D485 Neoplasm of uncertain behavior of skin: Secondary | ICD-10-CM | POA: Diagnosis not present

## 2016-03-07 DIAGNOSIS — L570 Actinic keratosis: Secondary | ICD-10-CM | POA: Diagnosis not present

## 2016-03-07 DIAGNOSIS — D225 Melanocytic nevi of trunk: Secondary | ICD-10-CM | POA: Diagnosis not present

## 2016-03-10 DIAGNOSIS — M25561 Pain in right knee: Secondary | ICD-10-CM | POA: Diagnosis not present

## 2016-03-15 DIAGNOSIS — M25561 Pain in right knee: Secondary | ICD-10-CM | POA: Diagnosis not present

## 2016-03-21 DIAGNOSIS — J301 Allergic rhinitis due to pollen: Secondary | ICD-10-CM | POA: Diagnosis not present

## 2016-03-21 DIAGNOSIS — J3089 Other allergic rhinitis: Secondary | ICD-10-CM | POA: Diagnosis not present

## 2016-03-31 DIAGNOSIS — M25561 Pain in right knee: Secondary | ICD-10-CM | POA: Diagnosis not present

## 2016-04-04 DIAGNOSIS — J40 Bronchitis, not specified as acute or chronic: Secondary | ICD-10-CM | POA: Diagnosis not present

## 2016-04-04 DIAGNOSIS — I10 Essential (primary) hypertension: Secondary | ICD-10-CM | POA: Diagnosis not present

## 2016-04-04 DIAGNOSIS — I2510 Atherosclerotic heart disease of native coronary artery without angina pectoris: Secondary | ICD-10-CM | POA: Diagnosis not present

## 2016-04-04 DIAGNOSIS — E7800 Pure hypercholesterolemia, unspecified: Secondary | ICD-10-CM | POA: Diagnosis not present

## 2016-04-11 ENCOUNTER — Ambulatory Visit (HOSPITAL_COMMUNITY)
Admission: RE | Admit: 2016-04-11 | Discharge: 2016-04-11 | Disposition: A | Discharge status: Home / Self Care | Payer: PPO | Source: Ambulatory Visit | Attending: Internal Medicine | Admitting: Internal Medicine

## 2016-04-11 ENCOUNTER — Other Ambulatory Visit: Payer: Self-pay | Admitting: Internal Medicine

## 2016-04-11 DIAGNOSIS — R918 Other nonspecific abnormal finding of lung field: Secondary | ICD-10-CM | POA: Diagnosis not present

## 2016-04-11 DIAGNOSIS — E7800 Pure hypercholesterolemia, unspecified: Secondary | ICD-10-CM | POA: Diagnosis not present

## 2016-04-11 DIAGNOSIS — I10 Essential (primary) hypertension: Secondary | ICD-10-CM | POA: Diagnosis not present

## 2016-04-11 DIAGNOSIS — R05 Cough: Secondary | ICD-10-CM

## 2016-04-11 DIAGNOSIS — R059 Cough, unspecified: Secondary | ICD-10-CM

## 2016-04-11 DIAGNOSIS — Z23 Encounter for immunization: Secondary | ICD-10-CM | POA: Diagnosis not present

## 2016-04-11 DIAGNOSIS — I2510 Atherosclerotic heart disease of native coronary artery without angina pectoris: Secondary | ICD-10-CM | POA: Diagnosis not present

## 2016-04-11 DIAGNOSIS — I700 Atherosclerosis of aorta: Secondary | ICD-10-CM | POA: Insufficient documentation

## 2016-04-13 DIAGNOSIS — M25561 Pain in right knee: Secondary | ICD-10-CM | POA: Diagnosis not present

## 2016-04-20 DIAGNOSIS — J3089 Other allergic rhinitis: Secondary | ICD-10-CM | POA: Diagnosis not present

## 2016-04-20 DIAGNOSIS — J301 Allergic rhinitis due to pollen: Secondary | ICD-10-CM | POA: Diagnosis not present

## 2016-04-26 DIAGNOSIS — N401 Enlarged prostate with lower urinary tract symptoms: Secondary | ICD-10-CM | POA: Diagnosis not present

## 2016-04-26 DIAGNOSIS — N200 Calculus of kidney: Secondary | ICD-10-CM | POA: Diagnosis not present

## 2016-04-26 DIAGNOSIS — R351 Nocturia: Secondary | ICD-10-CM | POA: Diagnosis not present

## 2016-04-26 DIAGNOSIS — R350 Frequency of micturition: Secondary | ICD-10-CM | POA: Diagnosis not present

## 2016-05-02 DIAGNOSIS — M545 Low back pain: Secondary | ICD-10-CM | POA: Diagnosis not present

## 2016-05-09 DIAGNOSIS — M545 Low back pain: Secondary | ICD-10-CM | POA: Diagnosis not present

## 2016-05-17 DIAGNOSIS — C44319 Basal cell carcinoma of skin of other parts of face: Secondary | ICD-10-CM | POA: Diagnosis not present

## 2016-05-24 DIAGNOSIS — J3089 Other allergic rhinitis: Secondary | ICD-10-CM | POA: Diagnosis not present

## 2016-05-24 DIAGNOSIS — J301 Allergic rhinitis due to pollen: Secondary | ICD-10-CM | POA: Diagnosis not present

## 2016-05-30 DIAGNOSIS — M1711 Unilateral primary osteoarthritis, right knee: Secondary | ICD-10-CM | POA: Diagnosis not present

## 2016-05-30 DIAGNOSIS — Z4789 Encounter for other orthopedic aftercare: Secondary | ICD-10-CM | POA: Diagnosis not present

## 2016-06-24 DIAGNOSIS — M7671 Peroneal tendinitis, right leg: Secondary | ICD-10-CM | POA: Diagnosis not present

## 2016-06-27 DIAGNOSIS — J3089 Other allergic rhinitis: Secondary | ICD-10-CM | POA: Diagnosis not present

## 2016-06-27 DIAGNOSIS — J301 Allergic rhinitis due to pollen: Secondary | ICD-10-CM | POA: Diagnosis not present

## 2016-06-28 DIAGNOSIS — M25571 Pain in right ankle and joints of right foot: Secondary | ICD-10-CM | POA: Diagnosis not present

## 2016-06-28 DIAGNOSIS — R262 Difficulty in walking, not elsewhere classified: Secondary | ICD-10-CM | POA: Diagnosis not present

## 2016-06-28 DIAGNOSIS — M79671 Pain in right foot: Secondary | ICD-10-CM | POA: Diagnosis not present

## 2016-06-30 DIAGNOSIS — R262 Difficulty in walking, not elsewhere classified: Secondary | ICD-10-CM | POA: Diagnosis not present

## 2016-06-30 DIAGNOSIS — M79671 Pain in right foot: Secondary | ICD-10-CM | POA: Diagnosis not present

## 2016-06-30 DIAGNOSIS — M25571 Pain in right ankle and joints of right foot: Secondary | ICD-10-CM | POA: Diagnosis not present

## 2016-07-01 DIAGNOSIS — J3081 Allergic rhinitis due to animal (cat) (dog) hair and dander: Secondary | ICD-10-CM | POA: Diagnosis not present

## 2016-07-01 DIAGNOSIS — H1045 Other chronic allergic conjunctivitis: Secondary | ICD-10-CM | POA: Diagnosis not present

## 2016-07-01 DIAGNOSIS — J301 Allergic rhinitis due to pollen: Secondary | ICD-10-CM | POA: Diagnosis not present

## 2016-07-01 DIAGNOSIS — J3089 Other allergic rhinitis: Secondary | ICD-10-CM | POA: Diagnosis not present

## 2016-07-08 DIAGNOSIS — Z4789 Encounter for other orthopedic aftercare: Secondary | ICD-10-CM | POA: Diagnosis not present

## 2016-07-08 DIAGNOSIS — M25561 Pain in right knee: Secondary | ICD-10-CM | POA: Diagnosis not present

## 2016-07-08 DIAGNOSIS — M1711 Unilateral primary osteoarthritis, right knee: Secondary | ICD-10-CM | POA: Diagnosis not present

## 2016-07-08 DIAGNOSIS — G8929 Other chronic pain: Secondary | ICD-10-CM | POA: Diagnosis not present

## 2016-07-12 DIAGNOSIS — J3089 Other allergic rhinitis: Secondary | ICD-10-CM | POA: Diagnosis not present

## 2016-07-12 DIAGNOSIS — J3081 Allergic rhinitis due to animal (cat) (dog) hair and dander: Secondary | ICD-10-CM | POA: Diagnosis not present

## 2016-07-12 DIAGNOSIS — H1045 Other chronic allergic conjunctivitis: Secondary | ICD-10-CM | POA: Diagnosis not present

## 2016-07-12 DIAGNOSIS — J301 Allergic rhinitis due to pollen: Secondary | ICD-10-CM | POA: Diagnosis not present

## 2016-07-20 DIAGNOSIS — M7671 Peroneal tendinitis, right leg: Secondary | ICD-10-CM | POA: Diagnosis not present

## 2016-07-25 DIAGNOSIS — J3089 Other allergic rhinitis: Secondary | ICD-10-CM | POA: Diagnosis not present

## 2016-07-25 DIAGNOSIS — J301 Allergic rhinitis due to pollen: Secondary | ICD-10-CM | POA: Diagnosis not present

## 2016-08-11 DIAGNOSIS — M7751 Other enthesopathy of right foot: Secondary | ICD-10-CM | POA: Diagnosis not present

## 2016-08-15 DIAGNOSIS — R351 Nocturia: Secondary | ICD-10-CM | POA: Diagnosis not present

## 2016-08-23 DIAGNOSIS — J3089 Other allergic rhinitis: Secondary | ICD-10-CM | POA: Diagnosis not present

## 2016-08-23 DIAGNOSIS — J301 Allergic rhinitis due to pollen: Secondary | ICD-10-CM | POA: Diagnosis not present

## 2016-09-05 DIAGNOSIS — M1711 Unilateral primary osteoarthritis, right knee: Secondary | ICD-10-CM | POA: Diagnosis not present

## 2016-09-09 DIAGNOSIS — M79672 Pain in left foot: Secondary | ICD-10-CM | POA: Diagnosis not present

## 2016-09-09 DIAGNOSIS — M7751 Other enthesopathy of right foot: Secondary | ICD-10-CM | POA: Diagnosis not present

## 2016-09-12 DIAGNOSIS — M1711 Unilateral primary osteoarthritis, right knee: Secondary | ICD-10-CM | POA: Diagnosis not present

## 2016-09-12 DIAGNOSIS — G8929 Other chronic pain: Secondary | ICD-10-CM | POA: Diagnosis not present

## 2016-09-12 DIAGNOSIS — R351 Nocturia: Secondary | ICD-10-CM | POA: Diagnosis not present

## 2016-09-12 DIAGNOSIS — M25561 Pain in right knee: Secondary | ICD-10-CM | POA: Diagnosis not present

## 2016-09-21 ENCOUNTER — Encounter: Payer: Self-pay | Admitting: Cardiology

## 2016-09-21 DIAGNOSIS — J301 Allergic rhinitis due to pollen: Secondary | ICD-10-CM | POA: Diagnosis not present

## 2016-09-21 DIAGNOSIS — M7751 Other enthesopathy of right foot: Secondary | ICD-10-CM | POA: Diagnosis not present

## 2016-09-21 DIAGNOSIS — J3089 Other allergic rhinitis: Secondary | ICD-10-CM | POA: Diagnosis not present

## 2016-09-22 DIAGNOSIS — M1711 Unilateral primary osteoarthritis, right knee: Secondary | ICD-10-CM | POA: Diagnosis not present

## 2016-09-26 DIAGNOSIS — M7751 Other enthesopathy of right foot: Secondary | ICD-10-CM | POA: Diagnosis not present

## 2016-09-26 DIAGNOSIS — M7671 Peroneal tendinitis, right leg: Secondary | ICD-10-CM | POA: Diagnosis not present

## 2016-10-03 DIAGNOSIS — M25571 Pain in right ankle and joints of right foot: Secondary | ICD-10-CM | POA: Diagnosis not present

## 2016-10-03 DIAGNOSIS — M7671 Peroneal tendinitis, right leg: Secondary | ICD-10-CM | POA: Diagnosis not present

## 2016-10-03 DIAGNOSIS — M79671 Pain in right foot: Secondary | ICD-10-CM | POA: Diagnosis not present

## 2016-10-03 DIAGNOSIS — R262 Difficulty in walking, not elsewhere classified: Secondary | ICD-10-CM | POA: Diagnosis not present

## 2016-10-09 NOTE — Progress Notes (Signed)
Cardiology Office Note   Date:  10/10/2016   ID:  Ross Level., DOB 1940/08/18, MRN [id number]  PCP:  Foye Spurling, MD  Cardiologist:   Minus Breeding, MD   Chief Complaint  Patient presents with  . Aortic Atherosclerosis      History of Present Illness: Ross [name]. is a 76 y.o. male who presents for evaluation of aortic calcification. He's being evaluated for kidney stone and had a CT. He had some calcium in his aorta down into his iliacs.  Echo in 2012 demonstrated normal left ventricular function with some mild aortic valve calcification. He's had minimal plaque on carotid Dopplers. Stress perfusion study in 2008 demonstrated no evidence of ischemia. POET (Plain Old Exercise Treadmill) in 2016 had some nonspecific ST changes but no clear evidence of ischemia.  He returns for follow up.  Since I last saw him he has done well. The patient denies any new symptoms such as chest discomfort, neck or arm discomfort. There has been no new shortness of breath, PND or orthopnea. There have been no reported palpitations, presyncope or syncope.  He is limited by knee pain.  He can bike and do some walking without symptoms.     Past Medical History:  Diagnosis Date  . Cancer ([place])    skin basal cell  . Essential and other specified forms of tremor 08/30/2013  . Hypertension   . Kidney stones   . Stroke [facility]) 2005    Past Surgical History:  Procedure Laterality Date  . APPENDECTOMY    . CATARACT EXTRACTION     bilateral  . CHOLECYSTECTOMY    . CYSTOSCOPY WITH RETROGRADE PYELOGRAM, URETEROSCOPY AND STENT PLACEMENT Left 06/11/2013   Procedure: CYSTOSCOPY WITH RETROGRADE Left PYELOGRAM, URETEROSCOPY AND Left STENT PLACEMENT;  Surgeon: Ailene Rud, MD;  Location: [facility];  Service: Urology;  Laterality: Left;  . KNEE SURGERY     bilateral arthroscopic  . SHOULDER SURGERY     right      Current Outpatient Prescriptions  Medication Sig Dispense Refill  .  acetaminophen (TYLENOL) 500 MG tablet Take 1,000 mg by mouth every 6 (six) hours as needed for moderate pain or headache.    . allopurinol (ZYLOPRIM) 100 MG tablet Take 100 mg by mouth at bedtime.     . Artificial Tear Ointment (DRY EYES OP) Place 1 drop into both eyes 2 (two) times daily.    Marland Kitchen aspirin EC 81 MG tablet Take 81 mg by mouth daily.    Marland Kitchen b complex vitamins tablet Take 1 tablet by mouth every morning.     . cetirizine (ZYRTEC) 10 MG tablet Take 10 mg by mouth every morning.     . cholecalciferol (VITAMIN D) 1000 UNITS tablet Take 2,000 Units by mouth every morning.     Marland Kitchen EPINEPHrine 0.3 mg/0.3 mL IJ SOAJ injection Inject 0.3 mg into the muscle daily as needed (allergic reaction).    Marland Kitchen ketoconazole (NIZORAL) 2 % cream Apply 1 application topically daily as needed for irritation.    Marland Kitchen lisinopril (PRINIVIL,ZESTRIL) 20 MG tablet Take 20 mg by mouth at bedtime.    Marland Kitchen lisinopril-hydrochlorothiazide (PRINZIDE,ZESTORETIC) 10-12.5 MG per tablet Take 1 tablet by mouth at bedtime.     . Multiple Vitamin (MULTIVITAMIN) tablet Take 1 tablet by mouth 2 (two) times daily. With lunch and at night.    Marland Kitchen MYRBETRIQ 50 MG TB24 tablet Take 50 mg by mouth daily.    Marland Kitchen  Potassium Citrate 15 MEQ (1620 MG) TBCR Take 2 tablets by mouth 2 (two) times daily.  6  . PRESCRIPTION MEDICATION 1 each by Subconjunctival route every 21 ( twenty-one) days. Allergy shot    . sodium chloride (MURO 128) 2 % ophthalmic solution Place 1 drop into both eyes 2 (two) times daily.    . tamsulosin (FLOMAX) 0.4 MG CAPS capsule Take 0.4 mg by mouth daily after supper.    Marland Kitchen Ubiquinol 100 MG CAPS Take 100 mg by mouth every morning.      No current facility-administered medications for this visit.     Allergies:   Patient has no known allergies.    ROS:  Please see the history of present illness.   Otherwise, review of systems are positive for none.   All other systems are reviewed and negative.    PHYSICAL EXAM: VS:  BP 130/60  (BP Location: Left Arm, Patient Position: Sitting, Cuff Size: Normal)   Pulse 75   Ht 5\' 8"  (1.727 m)   Wt 183 lb 12.8 oz (83.4 kg)   BMI 27.95 kg/m  , BMI Body mass index is 27.95 kg/m.  GENERAL:  Well appearing NECK:  No jugular venous distention, waveform within normal limits, carotid upstroke brisk and symmetric, no bruits, no thyromegaly LUNGS:  Clear to auscultation bilaterally CHEST:  Unremarkable HEART:  PMI not displaced or sustained,S1 and S2 within normal limits, no S3, no S4, no clicks, no rubs, no murmurs ABD:  Flat, positive bowel sounds normal in frequency in pitch, no bruits, no rebound, no guarding, no midline pulsatile mass, no hepatomegaly, no splenomegaly EXT:  2 plus pulses throughout, no edema, no cyanosis no clubbing   EKG:  EKG is ordered today. The ekg ordered today demonstrates sinus rhythm, rate 75, axis within normal limits, intervals within normal limits, no acute ST-T wave changes. PVCs.   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 He has no claudication.  He had a negative AAA last year.  He will continue with risk reduction.   BRUIT:   He had only mild plaque on Doppler last year.  No further imaging is planned.    RISK REDUCTION:  He has not been tolerant of statins and so we discussed diet and exercise.  Lipids are managed by Foye Spurling, MD   HTN:  The blood pressure is at target. No change in medications is indicated. We will continue with therapeutic lifestyle changes (TLC).  Current medicines are reviewed at length with the patient  today.  The patient does not have concerns regarding medicines.  The following changes have been made:  None  Labs/ tests ordered today include:   None  Orders Placed This Encounter  Procedures  . EKG 12-Lead     Disposition:   FU with me in one year.     Signed, Minus Breeding, MD  10/10/2016 5:22 PM    [facility]

## 2016-10-10 ENCOUNTER — Encounter: Payer: Self-pay | Admitting: Cardiology

## 2016-10-10 ENCOUNTER — Ambulatory Visit (INDEPENDENT_AMBULATORY_CARE_PROVIDER_SITE_OTHER): Payer: PPO | Admitting: Cardiology

## 2016-10-10 VITALS — BP 130/60 | HR 75 | Ht 68.0 in | Wt 183.8 lb

## 2016-10-10 DIAGNOSIS — M79671 Pain in right foot: Secondary | ICD-10-CM | POA: Diagnosis not present

## 2016-10-10 DIAGNOSIS — I10 Essential (primary) hypertension: Secondary | ICD-10-CM

## 2016-10-10 DIAGNOSIS — M25571 Pain in right ankle and joints of right foot: Secondary | ICD-10-CM | POA: Diagnosis not present

## 2016-10-10 DIAGNOSIS — I700 Atherosclerosis of aorta: Secondary | ICD-10-CM

## 2016-10-10 DIAGNOSIS — M7671 Peroneal tendinitis, right leg: Secondary | ICD-10-CM | POA: Diagnosis not present

## 2016-10-10 DIAGNOSIS — R262 Difficulty in walking, not elsewhere classified: Secondary | ICD-10-CM | POA: Diagnosis not present

## 2016-10-10 NOTE — Patient Instructions (Signed)

## 2016-10-13 DIAGNOSIS — M7671 Peroneal tendinitis, right leg: Secondary | ICD-10-CM | POA: Diagnosis not present

## 2016-10-13 DIAGNOSIS — M79671 Pain in right foot: Secondary | ICD-10-CM | POA: Diagnosis not present

## 2016-10-13 DIAGNOSIS — R262 Difficulty in walking, not elsewhere classified: Secondary | ICD-10-CM | POA: Diagnosis not present

## 2016-10-13 DIAGNOSIS — M25571 Pain in right ankle and joints of right foot: Secondary | ICD-10-CM | POA: Diagnosis not present

## 2016-10-17 DIAGNOSIS — M25571 Pain in right ankle and joints of right foot: Secondary | ICD-10-CM | POA: Diagnosis not present

## 2016-10-17 DIAGNOSIS — M7671 Peroneal tendinitis, right leg: Secondary | ICD-10-CM | POA: Diagnosis not present

## 2016-10-17 DIAGNOSIS — R262 Difficulty in walking, not elsewhere classified: Secondary | ICD-10-CM | POA: Diagnosis not present

## 2016-10-17 DIAGNOSIS — M79671 Pain in right foot: Secondary | ICD-10-CM | POA: Diagnosis not present

## 2016-10-18 DIAGNOSIS — J301 Allergic rhinitis due to pollen: Secondary | ICD-10-CM | POA: Diagnosis not present

## 2016-10-18 DIAGNOSIS — J3089 Other allergic rhinitis: Secondary | ICD-10-CM | POA: Diagnosis not present

## 2016-11-03 DIAGNOSIS — M7671 Peroneal tendinitis, right leg: Secondary | ICD-10-CM | POA: Diagnosis not present

## 2016-11-03 DIAGNOSIS — M79671 Pain in right foot: Secondary | ICD-10-CM | POA: Diagnosis not present

## 2016-11-03 DIAGNOSIS — R262 Difficulty in walking, not elsewhere classified: Secondary | ICD-10-CM | POA: Diagnosis not present

## 2016-11-03 DIAGNOSIS — M25571 Pain in right ankle and joints of right foot: Secondary | ICD-10-CM | POA: Diagnosis not present

## 2016-11-04 DIAGNOSIS — M1711 Unilateral primary osteoarthritis, right knee: Secondary | ICD-10-CM | POA: Diagnosis not present

## 2016-11-04 DIAGNOSIS — M25561 Pain in right knee: Secondary | ICD-10-CM | POA: Diagnosis not present

## 2016-11-04 DIAGNOSIS — G8929 Other chronic pain: Secondary | ICD-10-CM | POA: Diagnosis not present

## 2016-11-08 DIAGNOSIS — J3089 Other allergic rhinitis: Secondary | ICD-10-CM | POA: Diagnosis not present

## 2016-11-08 DIAGNOSIS — M7671 Peroneal tendinitis, right leg: Secondary | ICD-10-CM | POA: Diagnosis not present

## 2016-11-08 DIAGNOSIS — R262 Difficulty in walking, not elsewhere classified: Secondary | ICD-10-CM | POA: Diagnosis not present

## 2016-11-08 DIAGNOSIS — M79671 Pain in right foot: Secondary | ICD-10-CM | POA: Diagnosis not present

## 2016-11-08 DIAGNOSIS — M25571 Pain in right ankle and joints of right foot: Secondary | ICD-10-CM | POA: Diagnosis not present

## 2016-11-08 DIAGNOSIS — J301 Allergic rhinitis due to pollen: Secondary | ICD-10-CM | POA: Diagnosis not present

## 2016-11-11 DIAGNOSIS — M7671 Peroneal tendinitis, right leg: Secondary | ICD-10-CM | POA: Diagnosis not present

## 2016-11-11 DIAGNOSIS — M25571 Pain in right ankle and joints of right foot: Secondary | ICD-10-CM | POA: Diagnosis not present

## 2016-11-11 DIAGNOSIS — R262 Difficulty in walking, not elsewhere classified: Secondary | ICD-10-CM | POA: Diagnosis not present

## 2016-11-11 DIAGNOSIS — M79671 Pain in right foot: Secondary | ICD-10-CM | POA: Diagnosis not present

## 2016-11-11 NOTE — Progress Notes (Signed)
Please place orders in EPIC as patient is being scheduled for a pre-op appointment! Thank you! 

## 2016-11-14 ENCOUNTER — Telehealth: Payer: Self-pay | Admitting: Cardiology

## 2016-11-14 NOTE — Telephone Encounter (Signed)
Pt brought in clearance form fro sx at [facility] and was given to Hailey inTriage.

## 2016-11-24 ENCOUNTER — Other Ambulatory Visit (HOSPITAL_COMMUNITY): Payer: Self-pay | Admitting: Emergency Medicine

## 2016-11-24 DIAGNOSIS — M79671 Pain in right foot: Secondary | ICD-10-CM | POA: Diagnosis not present

## 2016-11-24 DIAGNOSIS — M25571 Pain in right ankle and joints of right foot: Secondary | ICD-10-CM | POA: Diagnosis not present

## 2016-11-24 DIAGNOSIS — R262 Difficulty in walking, not elsewhere classified: Secondary | ICD-10-CM | POA: Diagnosis not present

## 2016-11-24 DIAGNOSIS — M7671 Peroneal tendinitis, right leg: Secondary | ICD-10-CM | POA: Diagnosis not present

## 2016-11-24 NOTE — Patient Instructions (Signed)
Ross [name].  11/24/2016   Your procedure is scheduled on: 12-06-16  Report to [facility] Main  Entrance    Report to admitting at 1025AM   Call this number if you have problems the morning of surgery  [phone]   Remember: ONLY 1 PERSON MAY GO WITH YOU TO SHORT STAY TO GET  READY MORNING OF YOUR SURGERY.  Do not eat food or drink liquids :After Midnight.     Take these medicines the morning of surgery with A SIP OF WATER: tylenol as needed, xyrtec as needed, nasal spray as needed                                You may not have any metal on your body including hair pins and              piercings  Do not wear jewelry, make-up, lotions, powders or perfumes, deodorant              Men may shave face and neck.   Do not bring valuables to the hospital. Effort.  Contacts, dentures or bridgework may not be worn into surgery.  Leave suitcase in the car. After surgery it may be brought to your room.               Please read over the following fact sheets you were given: _____________________________________________________________________   [facility] - Preparing for Surgery Before surgery, you can play an important role.  Because skin is not sterile, your skin needs to be as free of germs as possible.  You can reduce the number of germs on your skin by washing with CHG (chlorahexidine gluconate) soap before surgery.  CHG is an antiseptic cleaner which kills germs and bonds with the skin to continue killing germs even after washing. Please DO NOT use if you have an allergy to CHG or antibacterial soaps.  If your skin becomes reddened/irritated stop using the CHG and inform your nurse when you arrive at Short Stay. Do not shave (including legs and underarms) for at least 48 hours prior to the first CHG shower.  You may shave your face/neck. Please follow these instructions carefully:  1.  Shower with  CHG Soap the night before surgery and the  morning of Surgery.  2.  If you choose to wash your hair, wash your hair first as usual with your  normal  shampoo.  3.  After you shampoo, rinse your hair and body thoroughly to remove the  shampoo.                           4.  Use CHG as you would any other liquid soap.  You can apply chg directly  to the skin and wash                       Gently with a scrungie or clean washcloth.  5.  Apply the CHG Soap to your body ONLY FROM THE NECK DOWN.   Do not use on face/ open  Wound or open sores. Avoid contact with eyes, ears mouth and genitals (private parts).                       Wash face,  Genitals (private parts) with your normal soap.             6.  Wash thoroughly, paying special attention to the area where your surgery  will be performed.  7.  Thoroughly rinse your body with warm water from the neck down.  8.  DO NOT shower/wash with your normal soap after using and rinsing off  the CHG Soap.                9.  Pat yourself dry with a clean towel.            10.  Wear clean pajamas.            11.  Place clean sheets on your bed the night of your first shower and do not  sleep with pets. Day of Surgery : Do not apply any lotions/deodorants the morning of surgery.  Please wear clean clothes to the hospital/surgery center.  FAILURE TO FOLLOW THESE INSTRUCTIONS MAY RESULT IN THE CANCELLATION OF YOUR SURGERY PATIENT SIGNATURE_________________________________  NURSE SIGNATURE__________________________________  ________________________________________________________________________  Ross [name]  An incentive spirometer is a tool that can help keep your lungs clear and active. This tool measures how well you are filling your lungs with each breath. Taking long deep breaths may help reverse or decrease the chance of developing breathing (pulmonary) problems (especially infection) following:  A long period of time  when you are unable to move or be active. BEFORE THE PROCEDURE   If the spirometer includes an indicator to show your best effort, your nurse or respiratory therapist will set it to a desired goal.  If possible, sit up straight or lean slightly forward. Try not to slouch.  Hold the incentive spirometer in an upright position. INSTRUCTIONS FOR USE  1. Sit on the edge of your bed if possible, or sit up as far as you can in bed or on a chair. 2. Hold the incentive spirometer in an upright position. 3. Breathe out normally. 4. Place the mouthpiece in your mouth and seal your lips tightly around it. 5. Breathe in slowly and as deeply as possible, raising the piston or the ball toward the top of the column. 6. Hold your breath for 3-5 seconds or for as long as possible. Allow the piston or ball to fall to the bottom of the column. 7. Remove the mouthpiece from your mouth and breathe out normally. 8. Rest for a few seconds and repeat Steps 1 through 7 at least 10 times every 1-2 hours when you are awake. Take your time and take a few normal breaths between deep breaths. 9. The spirometer may include an indicator to show your best effort. Use the indicator as a goal to work toward during each repetition. 10. After each set of 10 deep breaths, practice coughing to be sure your lungs are clear. If you have an incision (the cut made at the time of surgery), support your incision when coughing by placing a pillow or rolled up towels firmly against it. Once you are able to get out of bed, walk around indoors and cough well. You may stop using the incentive spirometer when instructed by your caregiver.  RISKS AND COMPLICATIONS  Take your time so you do not get dizzy  or light-headed.  If you are in pain, you may need to take or ask for pain medication before doing incentive spirometry. It is harder to take a deep breath if you are having pain. AFTER USE  Rest and breathe slowly and easily.  It can be  helpful to keep track of a log of your progress. Your caregiver can provide you with a simple table to help with this. If you are using the spirometer at home, follow these instructions: [place] IF:   You are having difficultly using the spirometer.  You have trouble using the spirometer as often as instructed.  Your pain medication is not giving enough relief while using the spirometer.  You develop fever of 100.5 F (38.1 C) or higher. SEEK IMMEDIATE MEDICAL CARE IF:   You cough up bloody sputum that had not been present before.  You develop fever of 102 F (38.9 C) or greater.  You develop worsening pain at or near the incision site. MAKE SURE YOU:   Understand these instructions.  Will watch your condition.  Will get help right away if you are not doing well or get worse. Document Released: 08/22/2006 Document Revised: 07/04/2011 Document Reviewed: 10/23/2006 ExitCare Patient Information 2014 [employer], [place].   ________________________________________________________________________  WHAT IS A BLOOD TRANSFUSION? Blood Transfusion Information  A transfusion is the replacement of blood or some of its parts. Blood is made up of multiple cells which provide different functions.  Red blood cells carry oxygen and are used for blood loss replacement.  White blood cells fight against infection.  Platelets control bleeding.  Plasma helps clot blood.  Other blood products are available for specialized needs, such as hemophilia or other clotting disorders. BEFORE THE TRANSFUSION  Who gives blood for transfusions?   Healthy volunteers who are fully evaluated to make sure their blood is safe. This is blood bank blood. Transfusion therapy is the safest it has ever been in the practice of medicine. Before blood is taken from a donor, a complete history is taken to make sure that person has no history of diseases nor engages in risky social behavior (examples are  intravenous drug use or sexual activity with multiple partners). The donor's travel history is screened to minimize risk of transmitting infections, such as malaria. The donated blood is tested for signs of infectious diseases, such as HIV and hepatitis. The blood is then tested to be sure it is compatible with you in order to minimize the chance of a transfusion reaction. If you or a relative donates blood, this is often done in anticipation of surgery and is not appropriate for emergency situations. It takes many days to process the donated blood. RISKS AND COMPLICATIONS Although transfusion therapy is very safe and saves many lives, the main dangers of transfusion include:   Getting an infectious disease.  Developing a transfusion reaction. This is an allergic reaction to something in the blood you were given. Every precaution is taken to prevent this. The decision to have a blood transfusion has been considered carefully by your caregiver before blood is given. Blood is not given unless the benefits outweigh the risks. AFTER THE TRANSFUSION  Right after receiving a blood transfusion, you will usually feel much better and more energetic. This is especially true if your red blood cells have gotten low (anemic). The transfusion raises the [name] of the red blood cells which carry oxygen, and this usually causes an energy increase.  The nurse administering the transfusion will monitor  you carefully for complications. HOME CARE INSTRUCTIONS  No special instructions are needed after a transfusion. You may find your energy is better. Speak with your caregiver about any limitations on activity for underlying diseases you may have. SEEK MEDICAL CARE IF:   Your condition is not improving after your transfusion.  You develop redness or irritation at the intravenous (IV) site. SEEK IMMEDIATE MEDICAL CARE IF:  Any of the following symptoms occur over the next 12 hours:  Shaking chills.  You have a  temperature by mouth above 102 F (38.9 C), not controlled by medicine.  Chest, back, or muscle pain.  People around you feel you are not acting correctly or are confused.  Shortness of breath or difficulty breathing.  Dizziness and fainting.  You get a rash or develop hives.  You have a decrease in urine output.  Your urine turns a dark color or changes to pink, red, or brown. Any of the following symptoms occur over the next 10 days:  You have a temperature by mouth above 102 F (38.9 C), not controlled by medicine.  Shortness of breath.  Weakness after normal activity.  The white part of the eye turns yellow (jaundice).  You have a decrease in the amount of urine or are urinating less often.  Your urine turns a dark color or changes to pink, red, or brown. Document Released: 04/08/2000 Document Revised: 07/04/2011 Document Reviewed: 11/26/2007 [facility] Patient Information 2014 [place], [place].  _______________________________________________________________________

## 2016-11-24 NOTE — Progress Notes (Addendum)
Medical clearance Dr Carlis Abbott 11-15-16 on chart   [facility] cardiology Dr [name] 10-10-16 epic   "Echo in 2012 demonstrated normal left ventricular function with some mild aortic valve calcification. He's had minimal plaque on carotid Dopplers. Stress perfusion study in 2008 demonstrated no evidence of ischemia. 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 He has no claudication.  He had a negative AAA last year.  He will continue with risk reduction.   BRUIT:   He had only mild plaque on Doppler last year.  No further imaging is planned."  EKG 10-10-16 epic  Stress Test 10-17-14 epic "I have reviewed the stress test myself. The patient had upsloping ST changes that did not meet criteria for a positive result. No change in therapy is indicated. No further testing. "  CXR 04-11-16 epic

## 2016-11-26 NOTE — H&P (Signed)
TOTAL KNEE ADMISSION H&P  Patient is being admitted for right total knee arthroplasty.  Subjective:  Chief Complaint:    Right knee primary osteoarthritis / pain.  HPI: Ross Level., 76 y.o. male, has a history of pain and functional disability in the right knee due to arthritis and has failed non-surgical conservative treatments for greater than 12 weeks to include NSAID's and/or analgesics, corticosteriod injections, viscosupplementation injections, supervised PT with diminished ADL's post treatment and activity modification.  Onset of symptoms was gradual, starting ~2 years ago with gradually worsening course since that time. The patient noted prior procedures on the knee to include  arthroscopy and menisectomy on the right knee(s).  Patient currently rates pain in the right knee(s) at 3 out of 10 with activity, but constant pain as well. Patient has night pain, worsening of pain with activity and weight bearing, pain that interferes with activities of daily living, pain with passive range of motion, crepitus and joint swelling.  Patient has evidence of periarticular osteophytes and joint space narrowing by imaging studies.  There is no active infection.  Risks, benefits and expectations were discussed with the patient.  Risks including but not limited to the risk of anesthesia, blood clots, nerve damage, blood vessel damage, failure of the prosthesis, infection and up to and including death.  Patient understand the risks, benefits and expectations and wishes to proceed with surgery.   PCP: Foye Spurling, MD  D/C Plans:       Home   Post-op Meds:      No Rx given  Tranexamic Acid:      To be given - IV    Decadron:      Is to be given  FYI:     ASA  [place]  DME:   Pt already has equipment   PT:   OPPT  - Rx given    Patient Active Problem List   Diagnosis Date Noted  . Aortic calcification ([place]) 09/28/2015  . Bruit of right carotid artery 09/28/2015  . Essential and other  specified forms of tremor 08/30/2013   Past Medical History:  Diagnosis Date  . 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 Essential and other specified forms of tremor 08/30/2013  . Hypertension   . Kidney stones   . Stroke [facility]) 2005    Past Surgical History:  Procedure Laterality Date  . APPENDECTOMY    . CATARACT EXTRACTION     bilateral  . CHOLECYSTECTOMY    . CYSTOSCOPY WITH RETROGRADE PYELOGRAM, URETEROSCOPY AND STENT PLACEMENT Left 06/11/2013   Procedure: CYSTOSCOPY WITH RETROGRADE Left PYELOGRAM, URETEROSCOPY AND Left STENT PLACEMENT;  Surgeon: Ailene Rud, MD;  Location: [facility] ORS;  Service: Urology;  Laterality: Left;  . KNEE SURGERY     bilateral arthroscopic  . SHOULDER SURGERY     right     No prescriptions prior to admission.   No Known Allergies   Social History  Substance Use Topics  . Smoking status: Former Smoker    Packs/day: 0.50    Years: 36.00    Types: Cigarettes    Quit date: 09/15/1996  . Smokeless tobacco: Never Used     Comment: Quit 20 years ago.   . Alcohol use 0.0 oz/week    Family History  Problem Relation Age of Onset  . Stroke Mother 46  . COPD Mother   . Heart attack Father 58  . Lymphoma Father  died age 61  . Cancer - Other Brother        esophageal  . Fibromyalgia Brother      Review of Systems  Constitutional: Negative.   HENT: Negative.   Eyes: Negative.   Respiratory: Negative.   Cardiovascular: Negative.   Gastrointestinal: Negative.   Genitourinary: Negative.   Musculoskeletal: Positive for joint pain.  Skin: Negative.   Neurological: Negative.   Endo/Heme/Allergies: Negative.   Psychiatric/Behavioral: Negative.     Objective:  Physical Exam  Constitutional: He is oriented to person, place, and time. He appears well-developed.  HENT:  Head: Normocephalic.  Eyes: Pupils are equal, round, and reactive to light.  Neck: Neck supple. No JVD present. No tracheal deviation present. No thyromegaly present.   Cardiovascular: Normal rate, regular rhythm and intact distal pulses.   Respiratory: Effort normal and breath sounds normal. No respiratory distress. He has no wheezes.  GI: Soft. There is no tenderness. There is no guarding.  Musculoskeletal:       Right knee: He exhibits decreased range of motion, swelling and bony tenderness. He exhibits no ecchymosis, no deformity, no laceration and no erythema. Tenderness found.  Lymphadenopathy:    He has no cervical adenopathy.  Neurological: He is alert and oriented to person, place, and time.  Skin: Skin is warm and dry.  Psychiatric: He has a normal mood and affect.       Labs:   Estimated body mass index is 27.95 kg/m as calculated from the following:   Height as of 10/10/16: 5\' 8"  (1.727 m).   Weight as of 10/10/16: 83.4 kg (183 lb 12.8 oz).   Imaging Review Plain radiographs demonstrate severe degenerative joint disease of the right knee(s). The bone quality appears to be good for age and reported activity level.  Assessment/Plan:  End stage arthritis, right knee   The patient history, physical examination, clinical judgment of the provider and imaging studies are consistent with end stage degenerative joint disease of the right knee(s) and total knee arthroplasty is deemed medically necessary. The treatment options including medical management, injection therapy arthroscopy and arthroplasty were discussed at length. The risks and benefits of total knee arthroplasty were presented and reviewed. The risks due to aseptic loosening, infection, stiffness, patella tracking problems, thromboembolic complications and other imponderables were discussed. The patient acknowledged the explanation, agreed to proceed with the plan and consent was signed. Patient is being admitted for inpatient treatment for surgery, pain control, PT, OT, prophylactic antibiotics, VTE prophylaxis, progressive ambulation and ADL's and discharge planning. The patient is  planning to be discharged home.      [place] Pugh Georgeana Oertel   PA-C  11/26/2016, 8:49 PM

## 2016-11-28 ENCOUNTER — Telehealth: Payer: Self-pay | Admitting: *Deleted

## 2016-11-28 ENCOUNTER — Encounter (HOSPITAL_COMMUNITY): Payer: Self-pay

## 2016-11-28 ENCOUNTER — Encounter (HOSPITAL_COMMUNITY)
Admission: RE | Admit: 2016-11-28 | Discharge: 2016-11-28 | Disposition: A | Discharge status: Home / Self Care | Payer: PPO | Source: Ambulatory Visit | Attending: Orthopedic Surgery | Admitting: Orthopedic Surgery

## 2016-11-28 DIAGNOSIS — Z01812 Encounter for preprocedural laboratory examination: Secondary | ICD-10-CM | POA: Diagnosis not present

## 2016-11-28 DIAGNOSIS — M1711 Unilateral primary osteoarthritis, right knee: Secondary | ICD-10-CM | POA: Insufficient documentation

## 2016-11-28 DIAGNOSIS — R262 Difficulty in walking, not elsewhere classified: Secondary | ICD-10-CM | POA: Diagnosis not present

## 2016-11-28 DIAGNOSIS — M79671 Pain in right foot: Secondary | ICD-10-CM | POA: Diagnosis not present

## 2016-11-28 DIAGNOSIS — M25571 Pain in right ankle and joints of right foot: Secondary | ICD-10-CM | POA: Diagnosis not present

## 2016-11-28 DIAGNOSIS — M7671 Peroneal tendinitis, right leg: Secondary | ICD-10-CM | POA: Diagnosis not present

## 2016-11-28 LAB — BASIC METABOLIC PANEL
Anion gap: 9 (ref 5–15)
BUN: 19 mg/dL (ref 6–20)
CO2: 28 mmol/L (ref 22–32)
CREATININE: 0.92 mg/dL (ref 0.61–1.24)
Calcium: 9.6 mg/dL (ref 8.9–10.3)
Chloride: 105 mmol/L (ref 101–111)
GFR calc non Af Amer: >60 mL/min (ref >60)
Glucose, Bld: 106 mg/dL — ABNORMAL HIGH (ref 65–99)
Potassium: 4.6 mmol/L (ref 3.5–5.1)
Sodium: 142 mmol/L (ref 135–145)

## 2016-11-28 LAB — CBC
HCT: 45.8 % (ref 39.0–52.0)
Hemoglobin: 15.7 g/dL (ref 13.0–17.0)
MCH: 32.3 pg (ref 26.0–34.0)
MCHC: 34.3 g/dL (ref 30.0–36.0)
MCV: 94.2 fL (ref 78.0–100.0)
PLATELETS: 182 10*3/uL (ref 150–400)
RBC: 4.86 MIL/uL (ref 4.22–5.81)
RDW: 13 % (ref 11.5–15.5)
WBC: 5.3 10*3/uL (ref 4.0–10.5)

## 2016-11-28 LAB — ABO/RH: ABO/RH(D): O POS

## 2016-11-28 LAB — SURGICAL PCR SCREEN
MRSA, PCR: NEGATIVE
Staphylococcus aureus: NEGATIVE

## 2016-11-28 NOTE — Telephone Encounter (Signed)
Requesting surgical clearance:   1. Type of surgery: Right: TKA-Medial & Lateral w/wo Patella Resurfacing  2. Surgeon: Dr Paralee Cancel  3. Surgical date: 12/06/2016  4. Medications that need to be help: None   5. [facility]: (P) [phone] (F) [phone]   Is pt cleared for surgery?

## 2016-11-29 NOTE — Progress Notes (Addendum)
Telephone note in epic for cardiac clearance from Dr [name] 11-29-16 "By the modified Truman Hayward criteria he has low surgical risk and is not going for a high risk procedure.  He has a moderate functional level.  Therefore, based on ACC/AHA guidelines, the patient would be at acceptable risk for the planned procedure without further cardiovascular testing"..Also, he has additional complaints of .Marland Kitchen..ALSO PHYSICAL COPY ON CHART

## 2016-11-29 NOTE — Telephone Encounter (Signed)
By the modified [place] criteria he has low surgical risk and is not going for a high risk procedure.  He has a moderate functional level.  Therefore, based on ACC/AHA guidelines, the patient would be at acceptable risk for the planned procedure without further cardiovascular testing.

## 2016-11-29 NOTE — Telephone Encounter (Signed)
Clearance faxed to [facility] via Epic 

## 2016-11-30 NOTE — Progress Notes (Signed)
RN called patient to inform of time change. Patient now aware surgery will start at 1130am and that he will need to arrive to admitting for check in no later than 9am!

## 2016-12-01 DIAGNOSIS — M25571 Pain in right ankle and joints of right foot: Secondary | ICD-10-CM | POA: Diagnosis not present

## 2016-12-01 DIAGNOSIS — M79671 Pain in right foot: Secondary | ICD-10-CM | POA: Diagnosis not present

## 2016-12-01 DIAGNOSIS — R262 Difficulty in walking, not elsewhere classified: Secondary | ICD-10-CM | POA: Diagnosis not present

## 2016-12-01 DIAGNOSIS — M7671 Peroneal tendinitis, right leg: Secondary | ICD-10-CM | POA: Diagnosis not present

## 2016-12-05 DIAGNOSIS — J301 Allergic rhinitis due to pollen: Secondary | ICD-10-CM | POA: Diagnosis not present

## 2016-12-05 DIAGNOSIS — J3089 Other allergic rhinitis: Secondary | ICD-10-CM | POA: Diagnosis not present

## 2016-12-06 ENCOUNTER — Inpatient Hospital Stay (HOSPITAL_COMMUNITY)
Admission: RE | Admit: 2016-12-06 | Discharge: 2016-12-07 | DRG: 470 | Disposition: A | Discharge status: Home / Self Care | Payer: PPO | Source: Ambulatory Visit | Attending: Orthopedic Surgery | Admitting: Orthopedic Surgery

## 2016-12-06 ENCOUNTER — Inpatient Hospital Stay (HOSPITAL_COMMUNITY): Payer: PPO | Admitting: Certified Registered Nurse Anesthetist

## 2016-12-06 ENCOUNTER — Encounter (HOSPITAL_COMMUNITY)
Admission: RE | Disposition: A | Discharge status: Home / Self Care | Payer: Self-pay | Source: Ambulatory Visit | Attending: Orthopedic Surgery

## 2016-12-06 ENCOUNTER — Encounter (HOSPITAL_COMMUNITY): Payer: Self-pay

## 2016-12-06 DIAGNOSIS — I700 Atherosclerosis of aorta: Secondary | ICD-10-CM | POA: Diagnosis present

## 2016-12-06 DIAGNOSIS — Z960 Presence of urogenital implants: Secondary | ICD-10-CM | POA: Diagnosis present

## 2016-12-06 DIAGNOSIS — Z9049 Acquired absence of other specified parts of digestive tract: Secondary | ICD-10-CM

## 2016-12-06 DIAGNOSIS — M1711 Unilateral primary osteoarthritis, right knee: Principal | ICD-10-CM | POA: Diagnosis present

## 2016-12-06 DIAGNOSIS — Z87891 Personal history of nicotine dependence: Secondary | ICD-10-CM | POA: Diagnosis not present

## 2016-12-06 DIAGNOSIS — I10 Essential (primary) hypertension: Secondary | ICD-10-CM | POA: Diagnosis present

## 2016-12-06 DIAGNOSIS — R251 Tremor, unspecified: Secondary | ICD-10-CM | POA: Diagnosis not present

## 2016-12-06 DIAGNOSIS — R066 Hiccough: Secondary | ICD-10-CM | POA: Diagnosis not present

## 2016-12-06 DIAGNOSIS — Z85820 Personal history of malignant melanoma of skin: Secondary | ICD-10-CM | POA: Diagnosis not present

## 2016-12-06 DIAGNOSIS — Z87442 Personal history of urinary calculi: Secondary | ICD-10-CM

## 2016-12-06 DIAGNOSIS — G8918 Other acute postprocedural pain: Secondary | ICD-10-CM | POA: Diagnosis not present

## 2016-12-06 DIAGNOSIS — R0989 Other specified symptoms and signs involving the circulatory and respiratory systems: Secondary | ICD-10-CM | POA: Diagnosis not present

## 2016-12-06 DIAGNOSIS — Z823 Family history of stroke: Secondary | ICD-10-CM

## 2016-12-06 DIAGNOSIS — I69312 Visuospatial deficit and spatial neglect following cerebral infarction: Secondary | ICD-10-CM

## 2016-12-06 DIAGNOSIS — Z8249 Family history of ischemic heart disease and other diseases of the circulatory system: Secondary | ICD-10-CM

## 2016-12-06 DIAGNOSIS — M25761 Osteophyte, right knee: Secondary | ICD-10-CM | POA: Diagnosis not present

## 2016-12-06 DIAGNOSIS — M6588 Other synovitis and tenosynovitis, other site: Secondary | ICD-10-CM | POA: Diagnosis present

## 2016-12-06 DIAGNOSIS — Z96651 Presence of right artificial knee joint: Secondary | ICD-10-CM

## 2016-12-06 HISTORY — PX: TOTAL KNEE ARTHROPLASTY: SHX125

## 2016-12-06 LAB — TYPE AND SCREEN
ABO/RH(D): O POS
ANTIBODY SCREEN: NEGATIVE

## 2016-12-06 SURGERY — ARTHROPLASTY, KNEE, TOTAL
Anesthesia: Spinal | Site: Knee | Laterality: Right

## 2016-12-06 MED ORDER — KETOROLAC TROMETHAMINE 30 MG/ML IJ SOLN
INTRAMUSCULAR | Status: DC | PRN
  Administered 2016-12-06: 30 mg via INTRAVENOUS

## 2016-12-06 MED ORDER — METOCLOPRAMIDE HCL 5 MG PO TABS: 5.0000 mg | ORAL_TABLET | Freq: Three times a day (TID) | ORAL | Status: DC | PRN

## 2016-12-06 MED ORDER — CEFAZOLIN SODIUM-DEXTROSE 2-4 GM/100ML-% IV SOLN
2.0000 g | INTRAVENOUS | Status: AC
  Administered 2016-12-06: 2 g via INTRAVENOUS

## 2016-12-06 MED ORDER — CHLORHEXIDINE GLUCONATE 4 % EX LIQD: 60.0000 mL | Freq: Once | CUTANEOUS | Status: DC

## 2016-12-06 MED ORDER — ACETAMINOPHEN 650 MG RE SUPP: 650.0000 mg | Freq: Four times a day (QID) | RECTAL | Status: DC | PRN

## 2016-12-06 MED ORDER — STERILE WATER FOR IRRIGATION IR SOLN
Status: DC | PRN
  Administered 2016-12-06: 2000 mL

## 2016-12-06 MED ORDER — BUPIVACAINE-EPINEPHRINE (PF) 0.25% -1:200000 IJ SOLN
INTRAMUSCULAR | Status: AC
  Filled 2016-12-06: qty 30

## 2016-12-06 MED ORDER — BUPIVACAINE-EPINEPHRINE 0.25% -1:200000 IJ SOLN
INTRAMUSCULAR | Status: AC
  Filled 2016-12-06: qty 1

## 2016-12-06 MED ORDER — HYDROMORPHONE HCL-NACL 0.5-0.9 MG/ML-% IV SOSY
0.5000 mg | PREFILLED_SYRINGE | INTRAVENOUS | Status: DC | PRN
  Administered 2016-12-06 (×2): 1 mg via INTRAVENOUS
  Filled 2016-12-06 (×2): qty 2

## 2016-12-06 MED ORDER — ADULT MULTIVITAMIN W/MINERALS CH
1.0000 | ORAL_TABLET | Freq: Every day | ORAL | Status: DC
  Administered 2016-12-06: 1 via ORAL
  Filled 2016-12-06 (×2): qty 1

## 2016-12-06 MED ORDER — POLYETHYLENE GLYCOL 3350 17 G PO PACK: 17.0000 g | PACK | Freq: Every day | ORAL | Status: DC | PRN

## 2016-12-06 MED ORDER — ONDANSETRON HCL 4 MG/2ML IJ SOLN
INTRAMUSCULAR | Status: DC | PRN
  Administered 2016-12-06: 4 mg via INTRAVENOUS

## 2016-12-06 MED ORDER — ASPIRIN 81 MG PO CHEW
81.0000 mg | CHEWABLE_TABLET | Freq: Two times a day (BID) | ORAL | Status: DC
  Administered 2016-12-06 – 2016-12-07 (×2): 81 mg via ORAL
  Filled 2016-12-06 (×2): qty 1

## 2016-12-06 MED ORDER — ONDANSETRON HCL 4 MG PO TABS: 4.0000 mg | ORAL_TABLET | Freq: Four times a day (QID) | ORAL | Status: DC | PRN

## 2016-12-06 MED ORDER — PROPOFOL 10 MG/ML IV BOLUS
INTRAVENOUS | Status: DC | PRN
  Administered 2016-12-06 (×2): 20 mg via INTRAVENOUS

## 2016-12-06 MED ORDER — ONDANSETRON HCL 4 MG/2ML IJ SOLN: 4.0000 mg | Freq: Four times a day (QID) | INTRAMUSCULAR | Status: DC | PRN

## 2016-12-06 MED ORDER — DOCUSATE SODIUM 100 MG PO CAPS
100.0000 mg | ORAL_CAPSULE | Freq: Two times a day (BID) | ORAL | Status: DC
  Administered 2016-12-06 – 2016-12-07 (×2): 100 mg via ORAL
  Filled 2016-12-06 (×2): qty 1

## 2016-12-06 MED ORDER — SODIUM CHLORIDE 0.9 % IV SOLN
INTRAVENOUS | Status: DC
  Administered 2016-12-06: 16:00:00 via INTRAVENOUS

## 2016-12-06 MED ORDER — FERROUS SULFATE 325 (65 FE) MG PO TABS
325.0000 mg | ORAL_TABLET | Freq: Two times a day (BID) | ORAL | Status: DC
  Administered 2016-12-06 – 2016-12-07 (×2): 325 mg via ORAL
  Filled 2016-12-06 (×2): qty 1

## 2016-12-06 MED ORDER — KETOROLAC TROMETHAMINE 30 MG/ML IJ SOLN
INTRAMUSCULAR | Status: AC
  Filled 2016-12-06: qty 1

## 2016-12-06 MED ORDER — CEFAZOLIN SODIUM-DEXTROSE 2-4 GM/100ML-% IV SOLN
INTRAVENOUS | Status: AC
  Filled 2016-12-06: qty 100

## 2016-12-06 MED ORDER — TRANEXAMIC ACID 1000 MG/10ML IV SOLN
1000.0000 mg | INTRAVENOUS | Status: AC
  Administered 2016-12-06: 1000 mg via INTRAVENOUS
  Filled 2016-12-06: qty 1100

## 2016-12-06 MED ORDER — FENTANYL CITRATE (PF) 100 MCG/2ML IJ SOLN
50.0000 ug | INTRAMUSCULAR | Status: DC | PRN
  Administered 2016-12-06: 50 ug via INTRAVENOUS

## 2016-12-06 MED ORDER — PROPOFOL 10 MG/ML IV BOLUS
INTRAVENOUS | Status: AC
  Filled 2016-12-06: qty 20

## 2016-12-06 MED ORDER — SODIUM CHLORIDE 0.9 % IJ SOLN
INTRAMUSCULAR | Status: DC | PRN
  Administered 2016-12-06: 30 mL

## 2016-12-06 MED ORDER — VITAMIN D 1000 UNITS PO TABS
2000.0000 [IU] | ORAL_TABLET | Freq: Every day | ORAL | Status: DC
  Administered 2016-12-06 – 2016-12-07 (×2): 2000 [IU] via ORAL
  Filled 2016-12-06 (×2): qty 2

## 2016-12-06 MED ORDER — ONDANSETRON HCL 4 MG/2ML IJ SOLN
INTRAMUSCULAR | Status: AC
  Filled 2016-12-06: qty 2

## 2016-12-06 MED ORDER — TRANEXAMIC ACID 1000 MG/10ML IV SOLN
1000.0000 mg | Freq: Once | INTRAVENOUS | Status: AC
  Administered 2016-12-06: 17:00:00 1000 mg via INTRAVENOUS
  Filled 2016-12-06: qty 1100

## 2016-12-06 MED ORDER — TAMSULOSIN HCL 0.4 MG PO CAPS: 0.4000 mg | ORAL_CAPSULE | Freq: Every day | ORAL | Status: DC

## 2016-12-06 MED ORDER — PHENOL 1.4 % MT LIQD: 1.0000 | OROMUCOSAL | Status: DC | PRN

## 2016-12-06 MED ORDER — MIDAZOLAM HCL 2 MG/2ML IJ SOLN
INTRAMUSCULAR | Status: AC
  Filled 2016-12-06: qty 2

## 2016-12-06 MED ORDER — DEXAMETHASONE SODIUM PHOSPHATE 10 MG/ML IJ SOLN
10.0000 mg | Freq: Once | INTRAMUSCULAR | Status: AC
  Administered 2016-12-06: 10 mg via INTRAVENOUS

## 2016-12-06 MED ORDER — MIDAZOLAM HCL 5 MG/ML IJ SOLN
1.0000 mg | INTRAMUSCULAR | Status: DC | PRN
  Administered 2016-12-06: 1 mg via INTRAVENOUS

## 2016-12-06 MED ORDER — DIPHENHYDRAMINE HCL 12.5 MG/5ML PO ELIX: 12.5000 mg | ORAL_SOLUTION | ORAL | Status: DC | PRN

## 2016-12-06 MED ORDER — METOCLOPRAMIDE HCL 5 MG/ML IJ SOLN: 5.0000 mg | Freq: Three times a day (TID) | INTRAMUSCULAR | Status: DC | PRN

## 2016-12-06 MED ORDER — DEXTROSE 5 % IV SOLN
500.0000 mg | Freq: Four times a day (QID) | INTRAVENOUS | Status: DC | PRN
  Administered 2016-12-06 (×2): 500 mg via INTRAVENOUS
  Filled 2016-12-06 (×2): qty 550

## 2016-12-06 MED ORDER — ROPIVACAINE HCL 5 MG/ML IJ SOLN
INTRAMUSCULAR | Status: DC | PRN
  Administered 2016-12-06: 30 mL via PERINEURAL

## 2016-12-06 MED ORDER — LORATADINE 10 MG PO TABS
10.0000 mg | ORAL_TABLET | Freq: Every day | ORAL | Status: DC
  Administered 2016-12-06 – 2016-12-07 (×2): 10 mg via ORAL
  Filled 2016-12-06 (×2): qty 1

## 2016-12-06 MED ORDER — BUPIVACAINE IN DEXTROSE 0.75-8.25 % IT SOLN
INTRATHECAL | Status: DC | PRN
  Administered 2016-12-06: 1.5 mL via INTRATHECAL

## 2016-12-06 MED ORDER — ALUM & MAG HYDROXIDE-SIMETH 200-200-20 MG/5ML PO SUSP: 30.0000 mL | ORAL | Status: DC | PRN

## 2016-12-06 MED ORDER — EPHEDRINE SULFATE-NACL 50-0.9 MG/10ML-% IV SOSY
PREFILLED_SYRINGE | INTRAVENOUS | Status: DC | PRN
  Administered 2016-12-06 (×2): 5 mg via INTRAVENOUS

## 2016-12-06 MED ORDER — POLYVINYL ALCOHOL 1.4 % OP SOLN
1.0000 [drp] | Freq: Every day | OPHTHALMIC | Status: DC
  Filled 2016-12-06: qty 15

## 2016-12-06 MED ORDER — ALLOPURINOL 100 MG PO TABS
100.0000 mg | ORAL_TABLET | Freq: Every day | ORAL | Status: DC
  Administered 2016-12-06: 22:00:00 100 mg via ORAL
  Filled 2016-12-06: qty 1

## 2016-12-06 MED ORDER — POTASSIUM CITRATE ER 15 MEQ (1620 MG) PO TBCR: 2.0000 | EXTENDED_RELEASE_TABLET | Freq: Two times a day (BID) | ORAL | Status: DC

## 2016-12-06 MED ORDER — 0.9 % SODIUM CHLORIDE (POUR BTL) OPTIME
TOPICAL | Status: DC | PRN
  Administered 2016-12-06: 1000 mL

## 2016-12-06 MED ORDER — PROPOFOL 10 MG/ML IV BOLUS
INTRAVENOUS | Status: AC
  Filled 2016-12-06: qty 40

## 2016-12-06 MED ORDER — POTASSIUM CITRATE ER 10 MEQ (1080 MG) PO TBCR
30.0000 meq | EXTENDED_RELEASE_TABLET | Freq: Two times a day (BID) | ORAL | Status: DC
  Administered 2016-12-06 – 2016-12-07 (×2): 30 meq via ORAL
  Filled 2016-12-06 (×2): qty 3

## 2016-12-06 MED ORDER — CELECOXIB 200 MG PO CAPS
200.0000 mg | ORAL_CAPSULE | Freq: Two times a day (BID) | ORAL | Status: DC
  Administered 2016-12-06 – 2016-12-07 (×2): 200 mg via ORAL
  Filled 2016-12-06 (×2): qty 1

## 2016-12-06 MED ORDER — B COMPLEX-C PO TABS
1.0000 | ORAL_TABLET | Freq: Every day | ORAL | Status: DC
  Administered 2016-12-07: 1 via ORAL
  Filled 2016-12-06 (×2): qty 1

## 2016-12-06 MED ORDER — BUPIVACAINE-EPINEPHRINE (PF) 0.25% -1:200000 IJ SOLN
INTRAMUSCULAR | Status: DC | PRN
  Administered 2016-12-06: 30 mL via PERINEURAL

## 2016-12-06 MED ORDER — EPHEDRINE 5 MG/ML INJ
INTRAVENOUS | Status: AC
  Filled 2016-12-06: qty 10

## 2016-12-06 MED ORDER — DEXAMETHASONE SODIUM PHOSPHATE 10 MG/ML IJ SOLN
10.0000 mg | Freq: Once | INTRAMUSCULAR | Status: AC
  Administered 2016-12-07: 10 mg via INTRAVENOUS
  Filled 2016-12-06: qty 1

## 2016-12-06 MED ORDER — HYDROCODONE-ACETAMINOPHEN 7.5-325 MG PO TABS
1.0000 | ORAL_TABLET | ORAL | Status: DC | PRN
  Administered 2016-12-06 – 2016-12-07 (×5): 2 via ORAL
  Filled 2016-12-06 (×5): qty 2

## 2016-12-06 MED ORDER — LIP MEDEX EX OINT
TOPICAL_OINTMENT | CUTANEOUS | Status: AC
  Administered 2016-12-06: 22:00:00
  Filled 2016-12-06: qty 7

## 2016-12-06 MED ORDER — SODIUM CHLORIDE (HYPERTONIC) 5 % OP SOLN
1.0000 [drp] | Freq: Two times a day (BID) | OPHTHALMIC | Status: DC
  Administered 2016-12-06 – 2016-12-07 (×2): 1 [drp] via OPHTHALMIC
  Filled 2016-12-06: qty 15

## 2016-12-06 MED ORDER — LACTATED RINGERS IV SOLN
INTRAVENOUS | Status: DC
  Administered 2016-12-06: 1000 mL via INTRAVENOUS
  Administered 2016-12-06: 12:00:00 via INTRAVENOUS

## 2016-12-06 MED ORDER — MIDAZOLAM HCL 2 MG/2ML IJ SOLN
INTRAMUSCULAR | Status: DC | PRN
  Administered 2016-12-06: 1 mg via INTRAVENOUS

## 2016-12-06 MED ORDER — ACETAMINOPHEN 325 MG PO TABS: 650.0000 mg | ORAL_TABLET | Freq: Four times a day (QID) | ORAL | Status: DC | PRN

## 2016-12-06 MED ORDER — MENTHOL 3 MG MT LOZG: 1.0000 | LOZENGE | OROMUCOSAL | Status: DC | PRN

## 2016-12-06 MED ORDER — FENTANYL CITRATE (PF) 100 MCG/2ML IJ SOLN
INTRAMUSCULAR | Status: AC
  Administered 2016-12-06: 50 ug via INTRAVENOUS
  Filled 2016-12-06: qty 2

## 2016-12-06 MED ORDER — MIRABEGRON ER 25 MG PO TB24
50.0000 mg | ORAL_TABLET | Freq: Every day | ORAL | Status: DC
  Administered 2016-12-06: 50 mg via ORAL
  Filled 2016-12-06: qty 2

## 2016-12-06 MED ORDER — B COMPLEX PO TABS
1.0000 | ORAL_TABLET | Freq: Every day | ORAL | Status: DC
Start: 2016-12-06 — End: 2016-12-06

## 2016-12-06 MED ORDER — METHOCARBAMOL 500 MG PO TABS
500.0000 mg | ORAL_TABLET | Freq: Four times a day (QID) | ORAL | Status: DC | PRN
  Administered 2016-12-07: 04:00:00 500 mg via ORAL
  Filled 2016-12-06: qty 1

## 2016-12-06 MED ORDER — DEXAMETHASONE SODIUM PHOSPHATE 10 MG/ML IJ SOLN
INTRAMUSCULAR | Status: AC
  Filled 2016-12-06: qty 1

## 2016-12-06 MED ORDER — FLUTICASONE PROPIONATE 50 MCG/ACT NA SUSP
1.0000 | Freq: Every day | NASAL | Status: DC
  Administered 2016-12-07: 11:00:00 1 via NASAL
  Filled 2016-12-06: qty 16

## 2016-12-06 MED ORDER — CEFAZOLIN SODIUM-DEXTROSE 1-4 GM/50ML-% IV SOLN
1.0000 g | Freq: Four times a day (QID) | INTRAVENOUS | Status: AC
  Administered 2016-12-06 (×2): 1 g via INTRAVENOUS
  Filled 2016-12-06 (×2): qty 50

## 2016-12-06 MED ORDER — PROPOFOL 500 MG/50ML IV EMUL
INTRAVENOUS | Status: DC | PRN
  Administered 2016-12-06: 100 ug/kg/min via INTRAVENOUS

## 2016-12-06 MED ORDER — SODIUM CHLORIDE 0.9 % IJ SOLN
INTRAMUSCULAR | Status: AC
  Filled 2016-12-06: qty 50

## 2016-12-06 SURGICAL SUPPLY — 47 items
ADH SKN CLS APL DERMABOND .7 (GAUZE/BANDAGES/DRESSINGS) ×1
BAG DECANTER FOR FLEXI CONT (MISCELLANEOUS) IMPLANT
BAG SPEC THK2 15X12 ZIP CLS (MISCELLANEOUS) ×1
BAG ZIPLOCK 12X15 (MISCELLANEOUS) ×2 IMPLANT
BANDAGE ACE 6X5 VEL STRL LF (GAUZE/BANDAGES/DRESSINGS) ×3 IMPLANT
BLADE SAW SGTL 11.0X1.19X90.0M (BLADE) IMPLANT
BLADE SAW SGTL 13.0X1.19X90.0M (BLADE) ×3 IMPLANT
BOWL SMART MIX CTS (DISPOSABLE) ×3 IMPLANT
CAPT KNEE TOTAL 3 ATTUNE ×2 IMPLANT
CEMENT HV SMART SET (Cement) ×4 IMPLANT
COVER SURGICAL LIGHT HANDLE (MISCELLANEOUS) ×3 IMPLANT
CUFF TOURN SGL QUICK 34 (TOURNIQUET CUFF) ×3
CUFF TRNQT CYL 34X4X40X1 (TOURNIQUET CUFF) ×1 IMPLANT
DECANTER SPIKE VIAL GLASS SM (MISCELLANEOUS) ×3 IMPLANT
DERMABOND ADVANCED (GAUZE/BANDAGES/DRESSINGS) ×2
DERMABOND ADVANCED .7 DNX12 (GAUZE/BANDAGES/DRESSINGS) ×1 IMPLANT
DRAPE U-SHAPE 47X51 STRL (DRAPES) ×3 IMPLANT
DRESSING AQUACEL AG SP 3.5X10 (GAUZE/BANDAGES/DRESSINGS) ×1 IMPLANT
DRSG AQUACEL AG SP 3.5X10 (GAUZE/BANDAGES/DRESSINGS) ×3
DURAPREP 26ML APPLICATOR (WOUND CARE) ×6 IMPLANT
ELECT REM PT RETURN 15FT ADLT (MISCELLANEOUS) ×3 IMPLANT
GLOVE BIOGEL M 7.0 STRL (GLOVE) IMPLANT
GLOVE BIOGEL PI IND STRL 7.5 (GLOVE) ×1 IMPLANT
GLOVE BIOGEL PI IND STRL 8.5 (GLOVE) ×1 IMPLANT
GLOVE BIOGEL PI INDICATOR 7.5 (GLOVE) ×2
GLOVE BIOGEL PI INDICATOR 8.5 (GLOVE) ×2
GLOVE ECLIPSE 8.0 STRL XLNG CF (GLOVE) ×3 IMPLANT
GLOVE ORTHO TXT STRL SZ7.5 (GLOVE) ×6 IMPLANT
GOWN STRL REUS W/TWL LRG LVL3 (GOWN DISPOSABLE) ×3 IMPLANT
GOWN STRL REUS W/TWL XL LVL3 (GOWN DISPOSABLE) ×3 IMPLANT
HANDPIECE INTERPULSE COAX TIP (DISPOSABLE) ×3
MANIFOLD NEPTUNE II (INSTRUMENTS) ×3 IMPLANT
PACK TOTAL KNEE CUSTOM (KITS) ×3 IMPLANT
POSITIONER SURGICAL ARM (MISCELLANEOUS) ×3 IMPLANT
SET HNDPC FAN SPRY TIP SCT (DISPOSABLE) ×1 IMPLANT
SET PAD KNEE POSITIONER (MISCELLANEOUS) ×3 IMPLANT
SUT MNCRL AB 4-0 PS2 18 (SUTURE) ×3 IMPLANT
SUT STRATAFIX 0 PDS 27 VIOLET (SUTURE) ×3
SUT VIC AB 1 CT1 36 (SUTURE) ×3 IMPLANT
SUT VIC AB 2-0 CT1 27 (SUTURE) ×9
SUT VIC AB 2-0 CT1 TAPERPNT 27 (SUTURE) ×3 IMPLANT
SUTURE STRATFX 0 PDS 27 VIOLET (SUTURE) ×1 IMPLANT
SYR 50ML LL SCALE MARK (SYRINGE) ×3 IMPLANT
TRAY FOLEY W/METER SILVER 16FR (SET/KITS/TRAYS/PACK) ×3 IMPLANT
WATER STERILE IRR 1500ML POUR (IV SOLUTION) ×3 IMPLANT
WRAP KNEE MAXI GEL POST OP (GAUZE/BANDAGES/DRESSINGS) ×3 IMPLANT
YANKAUER SUCT BULB TIP 10FT TU (MISCELLANEOUS) ×3 IMPLANT

## 2016-12-06 NOTE — Op Note (Signed)
NAME:  Ross [name].                      MEDICAL RECORD NO.:  [id number]                             FACILITY:  [facility]      PHYSICIAN:  Pietro Cassis. Alvan Dame, M.D.  DATE OF BIRTH:  02/23/1941      DATE OF PROCEDURE:  12/06/2016                                     OPERATIVE REPORT         PREOPERATIVE DIAGNOSIS:  Right knee osteoarthritis.      POSTOPERATIVE DIAGNOSIS:  Right knee osteoarthritis.      FINDINGS:  The patient was noted to have complete loss of cartilage and   bone-on-bone arthritis with associated osteophytes in all three compartments of   the knee with a significant synovitis and associated effusion.      PROCEDURE:  Right total knee replacement.      COMPONENTS USED:  DePuy Attune rotating platform posterior stabilized knee   system, a size 7 femur, 7 tibia, size 6 PS AOX insert, and 38 anatomic patellar   button.      SURGEON:  Pietro Cassis. Alvan Dame, M.D.      ASSISTANT:  Molli Barrows, PA-C.      ANESTHESIA:  Regional and Spinal.      SPECIMENS:  None.      COMPLICATION:  None.      DRAINS:  None.  EBL: <100cc      TOURNIQUET TIME:   Total Tourniquet Time Documented: Thigh (Right) - 31 minutes Total: Thigh (Right) - 31 minutes  .      The patient was stable to the recovery room.      INDICATION FOR PROCEDURE:  [name]. is a 76 y.o. male patient of   mine.  The patient had been seen, evaluated, and treated conservatively in the   office with medication, activity modification, and injections.  The patient had   radiographic changes of bone-on-bone arthritis with endplate sclerosis and osteophytes noted.      The patient failed conservative measures including medication, injections, and activity modification, and at this point was ready for more definitive measures.   Based on the radiographic changes and failed conservative measures, the patient   decided to proceed with total knee replacement.  Risks of infection,   DVT, component failure,  need for revision surgery, postop course, and   expectations were all   discussed and reviewed.  Consent was obtained for benefit of pain   relief.      PROCEDURE IN DETAIL:  The patient was brought to the operative theater.   Once adequate anesthesia, preoperative antibiotics, 2 gm of Ancef, 1 gm of Tranexamic Acid, and 10 mg of Decadron administered, the patient was positioned supine with the right thigh tourniquet placed.  The  right lower extremity was prepped and draped in sterile fashion.  A time-   out was performed identifying the patient, planned procedure, and   extremity.      The right lower extremity was placed in the [facility] leg holder.  The leg was   exsanguinated, tourniquet elevated to 250 mmHg.  A midline incision was  made followed by median parapatellar arthrotomy.  Following initial   exposure, attention was first directed to the patella.  Precut   measurement was noted to be 26 mm.  I resected down to 14-15 mm and used a   38 anatomic patellar button to restore patellar height as well as cover the cut   surface.      The lug holes were drilled and a metal shim was placed to protect the   patella from retractors and saw blades.      At this point, attention was now directed to the femur.  The femoral   canal was opened with a drill, irrigated to try to prevent fat emboli.  An   intramedullary rod was passed at 3 degrees valgus, 9 mm of bone was   resected off the distal femur.  Following this resection, the tibia was   subluxated anteriorly.  Using the extramedullary guide, 2 mm of bone was resected off   the proximal medial tibia.  We confirmed the gap would be   stable medially and laterally with a size 5 spacer block as well as confirmed   the cut was perpendicular in the coronal plane, checking with an alignment rod.      Once this was done, I sized the femur to be a size 7 in the anterior-   posterior dimension, chose a standard component based on medial and    lateral dimension.  The size 7 rotation block was then pinned in   position anterior referenced using the C-clamp to set rotation.  The   anterior, posterior, and  chamfer cuts were made without difficulty nor   notching making certain that I was along the anterior cortex to help   with flexion gap stability.      The final box cut was made off the lateral aspect of distal femur.      At this point, the tibia was sized to be a size 7, the size 7 tray was   then pinned in position through the medial third of the tubercle,   drilled, and keel punched.  Trial reduction was now carried with a 7 femur,  7 tibia, a size 6 PS insert, and the 38 anatomic patella botton.  The knee was brought to   extension, full extension with good flexion stability with the patella   tracking through the trochlea without application of pressure.  Given   all these findings the femoral lug holes were drilled and then the trial components removed.  Final components were   opened and cement was mixed.  The knee was irrigated with normal saline   solution and pulse lavage.  The synovial lining was   then injected with 30 cc of 0.25% Marcaine with epinephrine and 1 cc of Toradol plus 30 cc of NS for a   total of 61 cc.      The knee was irrigated.  Final implants were then cemented onto clean and   dried cut surfaces of bone with the knee brought to extension with a size 6 trial insert.      Once the cement had fully cured, the excess cement was removed   throughout the knee.  I confirmed I was satisfied with the range of   motion and stability, and the final size 6 PS AOX insert was chosen.  It was   placed into the knee.      The tourniquet had been let down at 31 minutes.  No significant   hemostasis required.  The   extensor mechanism was then reapproximated using #1 Vicryl and #0 V-lock sutures with the knee   in flexion.  The   remaining wound was closed with 2-0 Vicryl and running 4-0 Monocryl.   The  knee was cleaned, dried, dressed sterilely using Dermabond and   Aquacel dressing.  The patient was then   brought to recovery room in stable condition, tolerating the procedure   well.   Please note that Physician Assistant, Molli Barrows, PA-C, was present for the entirety of the case, and was utilized for pre-operative positioning, peri-operative retractor management, general facilitation of the procedure.  He was also utilized for primary wound closure at the end of the case.              Pietro Cassis Alvan Dame, M.D.    12/06/2016 12:53 PM

## 2016-12-06 NOTE — Transfer of Care (Signed)
Immediate Anesthesia Transfer of Care Note  Patient: Ross [name].  Procedure(s) Performed: Procedure(s) with comments: RIGHT TOTAL KNEE ARTHROPLASTY (Right) - 70 mins  Patient Location: PACU  Anesthesia Type:MAC, Regional and Spinal  Level of Consciousness: Patient easily awoken, sedated, comfortable, cooperative, following commands, responds to stimulation.   Airway & Oxygen Therapy: Patient spontaneously breathing, ventilating well, oxygen via simple oxygen mask.  Post-op Assessment: Report given to PACU RN, vital signs reviewed and stable.   Post vital signs: Reviewed and stable.  Complications: No apparent anesthesia complications   Last Vitals:  Vitals:   12/06/16 1112 12/06/16 1114  BP:    Pulse: (!) 58 (!) 59  Resp: 13 12  Temp:    SpO2: 94% 95%    Last Pain:  Vitals:   12/06/16 0916  TempSrc: Oral      Patients Stated Pain Goal: 4 (79/39/03 0092)  Complications: No apparent anesthesia complications

## 2016-12-06 NOTE — Anesthesia Preprocedure Evaluation (Signed)
Anesthesia Evaluation  Patient identified by MRN, date of birth, ID band Patient awake    Reviewed: Allergy & Precautions, NPO status , Patient's Chart, lab work & pertinent test results  Airway Mallampati: II  TM Distance: >3 FB Neck ROM: Full    Dental no notable dental hx.    Pulmonary neg pulmonary ROS, former smoker,    Pulmonary exam normal breath sounds clear to auscultation       Cardiovascular hypertension, Pt. on medications Normal cardiovascular exam Rhythm:Regular Rate:Normal     Neuro/Psych CVA (visual field cut), Residual Symptoms negative psych ROS   GI/Hepatic negative GI ROS, Neg liver ROS,   Endo/Other  negative endocrine ROS  Renal/GU negative Renal ROS  negative genitourinary   Musculoskeletal negative musculoskeletal ROS (+)   Abdominal   Peds negative pediatric ROS (+)  Hematology negative hematology ROS (+)   Anesthesia Other Findings   Reproductive/Obstetrics negative OB ROS                             Anesthesia Physical Anesthesia Plan  ASA: III  Anesthesia Plan: Spinal   Post-op Pain Management:  Regional for Post-op pain   Induction:   PONV Risk Score and Plan: 1 and Ondansetron, Dexamethasone and Treatment may vary due to age or medical condition  Airway Management Planned: Simple Face Mask  Additional Equipment:   Intra-op Plan:   Post-operative Plan:   Informed Consent: I have reviewed the patients History and Physical, chart, labs and discussed the procedure including the risks, benefits and alternatives for the proposed anesthesia with the patient or authorized representative who has indicated his/her understanding and acceptance.   Dental advisory given  Plan Discussed with: CRNA  Anesthesia Plan Comments: (Adductor block)        Anesthesia Quick Evaluation

## 2016-12-06 NOTE — Progress Notes (Signed)
AssistedDr. Carignan with right, ultrasound guided, adductor canal block. Side rails up, monitors on throughout procedure. See vital signs in flow sheet. Tolerated Procedure well.  

## 2016-12-06 NOTE — Anesthesia Procedure Notes (Addendum)
Anesthesia Regional Block: Adductor canal block   Pre-Anesthetic Checklist: ,, timeout performed, Correct Patient, Correct Site, Correct Laterality, Correct Procedure, Correct Position, site marked, Risks and benefits discussed,  Surgical consent,  Pre-op evaluation,  At surgeon's request and post-op pain management  Laterality: Right and Lower  Prep: Maximum Sterile Barrier Precautions used, chloraprep       Needles:  Injection technique: Single-shot  Needle Type: Echogenic Stimulator Needle     Needle Length: 10cm      Additional Needles:   Procedures: ultrasound guided,,,,,,,,  Narrative:  Start time: 12/06/2016 10:50 AM End time: 12/06/2016 11:00 AM Injection made incrementally with aspirations every 5 mL.  Performed by: Personally  Anesthesiologist: Montez Hageman  Additional Notes: Risks, benefits and alternative to block explained extensively.  Patient tolerated procedure well, without complications.

## 2016-12-06 NOTE — Interval H&P Note (Signed)
History and Physical Interval Note:  12/06/2016 10:15 AM  Ross [name].  has presented today for surgery, with the diagnosis of Right knee osteoarthritis  The various methods of treatment have been discussed with the patient and family. After consideration of risks, benefits and other options for treatment, the patient has consented to  Procedure(s) with comments: RIGHT TOTAL KNEE ARTHROPLASTY (Right) - 70 mins as a surgical intervention .  The patient's history has been reviewed, patient examined, no change in status, stable for surgery.  I have reviewed the patient's chart and labs.  Questions were answered to the patient's satisfaction.     Mauri Pole

## 2016-12-06 NOTE — Anesthesia Procedure Notes (Signed)
Date/Time: 12/06/2016 11:58 AM Performed by: Deliah Boston Pre-anesthesia Checklist: Patient identified, Emergency Drugs available and Suction available Patient Re-evaluated:Patient Re-evaluated prior to induction Oxygen Delivery Method: Nasal cannula

## 2016-12-07 LAB — CBC
HEMATOCRIT: 37.8 % — AB (ref 39.0–52.0)
Hemoglobin: 13 g/dL (ref 13.0–17.0)
MCH: 31.5 pg (ref 26.0–34.0)
MCHC: 34.4 g/dL (ref 30.0–36.0)
MCV: 91.5 fL (ref 78.0–100.0)
Platelets: 177 10*3/uL (ref 150–400)
RBC: 4.13 MIL/uL — ABNORMAL LOW (ref 4.22–5.81)
RDW: 12.4 % (ref 11.5–15.5)
WBC: 10.3 10*3/uL (ref 4.0–10.5)

## 2016-12-07 LAB — BASIC METABOLIC PANEL
Anion gap: 7 (ref 5–15)
BUN: 16 mg/dL (ref 6–20)
CALCIUM: 8.4 mg/dL — AB (ref 8.9–10.3)
CO2: 26 mmol/L (ref 22–32)
CREATININE: 0.83 mg/dL (ref 0.61–1.24)
Chloride: 103 mmol/L (ref 101–111)
GFR calc Af Amer: >60 mL/min (ref >60)
GFR calc non Af Amer: >60 mL/min (ref >60)
GLUCOSE: 119 mg/dL — AB (ref 65–99)
Potassium: 4.7 mmol/L (ref 3.5–5.1)
Sodium: 136 mmol/L (ref 135–145)

## 2016-12-07 MED ORDER — ADULT MULTIVITAMIN W/MINERALS CH
1.0000 | ORAL_TABLET | Freq: Every day | ORAL | Status: DC
  Administered 2016-12-07: 1 via ORAL
  Filled 2016-12-07: qty 1

## 2016-12-07 MED ORDER — DOCUSATE SODIUM 100 MG PO CAPS: 100.0000 mg | ORAL_CAPSULE | Freq: Two times a day (BID) | ORAL | 0 refills | Status: DC

## 2016-12-07 MED ORDER — FERROUS SULFATE 325 (65 FE) MG PO TABS: 325.0000 mg | ORAL_TABLET | Freq: Two times a day (BID) | ORAL | 0 refills | Status: DC

## 2016-12-07 MED ORDER — HYDROCODONE-ACETAMINOPHEN 7.5-325 MG PO TABS: 1.0000 | ORAL_TABLET | ORAL | 0 refills | Status: DC | PRN

## 2016-12-07 MED ORDER — POLYETHYLENE GLYCOL 3350 17 G PO PACK: 17.0000 g | PACK | Freq: Every day | ORAL | 0 refills | Status: DC | PRN

## 2016-12-07 MED ORDER — SODIUM CHLORIDE 0.9 % IV SOLN
25.0000 mg | Freq: Once | INTRAVENOUS | Status: AC
  Administered 2016-12-07: 25 mg via INTRAVENOUS
  Filled 2016-12-07: qty 1

## 2016-12-07 MED ORDER — ASPIRIN 81 MG PO CHEW: 81.0000 mg | CHEWABLE_TABLET | Freq: Two times a day (BID) | ORAL | 0 refills | Status: DC

## 2016-12-07 MED ORDER — METHOCARBAMOL 500 MG PO TABS: 500.0000 mg | ORAL_TABLET | Freq: Four times a day (QID) | ORAL | 0 refills | Status: DC | PRN

## 2016-12-07 NOTE — Progress Notes (Signed)
Discharge planning, no HH needs identified. Plan for OP PT, already arranged, has DME at home. [phone]

## 2016-12-07 NOTE — Evaluation (Signed)
Physical Therapy Evaluation Patient Details Name: Ross [name]. MRN: [id number] DOB: [birth year] Today's Date: 12/07/2016   History of Present Illness  s/p R TKA  Clinical Impression  The patient is progressing well. Plans DC today after PM PT session. Pt admitted with above diagnosis. Pt currently with functional limitations due to the deficits listed below (see PT Problem List).  Pt will benefit from skilled PT to increase their independence and safety with mobility to allow discharge to the venue listed below.       Follow Up Recommendations Outpatient PT;DC plan and follow up therapy as arranged by surgeon    Equipment Recommendations  None recommended by PT    Recommendations for Other Services       Precautions / Restrictions Precautions Precautions: Knee Restrictions RLE Weight Bearing: Weight bearing as tolerated      Mobility  Bed Mobility Overal bed mobility: Needs Assistance Bed Mobility: Supine to Sit     Supine to sit: Min assist     General bed mobility comments: for RLE  Transfers Overall transfer level: Needs assistance Equipment used: Rolling walker (2 wheeled) Transfers: Sit to/from Stand Sit to Stand: Min guard         General transfer comment: cues for UE/LE placement  Ambulation/Gait Ambulation/Gait assistance: Min assist Ambulation Distance (Feet): 120 Feet Assistive device: Rolling walker (2 wheeled) Gait Pattern/deviations: Step-to pattern;Antalgic;Decreased step length - right;Decreased stance time - right     General Gait Details: cues for sequence  Stairs            Wheelchair Mobility    Modified Rankin (Stroke Patients Only)       Balance                                             Pertinent Vitals/Pain Pain Assessment: Faces Faces Pain Scale: Hurts little more Pain Location: R knee Pain Descriptors / Indicators: Aching Pain Intervention(s): Monitored during session;Premedicated  before session;Repositioned;Ice applied    Home Living Family/patient expects to be discharged to:: Private residence Living Arrangements: Spouse/significant other Available Help at Discharge: Family Type of Home: House Home Access: Stairs to enter Entrance Stairs-Rails: None Entrance Stairs-Number of Steps: 2 Home Layout: Two level;Able to live on main level with bedroom/bathroom Home Equipment: Gilford Rile - 2 wheels;Bedside commode Additional Comments: has walk in shower and 3:1 commode    Prior Function Level of Independence: Independent               Hand Dominance        Extremity/Trunk Assessment   Upper Extremity Assessment Upper Extremity Assessment: Defer to OT evaluation    Lower Extremity Assessment Lower Extremity Assessment: RLE deficits/detail RLE Deficits / Details: SLR with min assist, knee flexion 10-50    Cervical / Trunk Assessment Cervical / Trunk Assessment: Normal  Communication   Communication: No difficulties  Cognition Arousal/Alertness: Awake/alert Behavior During Therapy: WFL for tasks assessed/performed Overall Cognitive Status: Within Functional Limits for tasks assessed                                        General Comments      Exercises Total Joint Exercises Ankle Circles/Pumps: AROM;Both;10 reps Quad Sets: AROM;Both;10 reps Short Arc QuadSinclair [name];Right;10 reps Heel Slides: AAROM;Right;10 reps  Hip ABduction/ADduction: AAROM;Right;10 reps Straight Leg Raises: AAROM;Right;10 reps   Assessment/Plan    PT Assessment Patient needs continued PT services  PT Problem List Decreased strength;Decreased range of motion;Decreased activity tolerance;Decreased mobility;Decreased knowledge of precautions;Decreased safety awareness;Decreased knowledge of use of DME;Pain       PT Treatment Interventions DME instruction;Gait training;Stair training;Functional mobility training;Therapeutic activities;Therapeutic  exercise;Patient/family education    PT Goals (Current goals can be found in the Care Plan section)  Acute Rehab PT Goals Patient Stated Goal: return to independence PT Goal Formulation: With patient/family Time For Goal Achievement: 12/10/16 Potential to Achieve Goals: Good    Frequency 7X/week   Barriers to discharge        Co-evaluation               AM-PAC PT "6 Clicks" Daily Activity  Outcome Measure Difficulty turning over in bed (including adjusting bedclothes, sheets and blankets)?: A Little Difficulty moving from lying on back to sitting on the side of the bed? : A Little Difficulty sitting down on and standing up from a chair with arms (e.g., wheelchair, bedside commode, etc,.)?: Total Help needed moving to and from a bed to chair (including a wheelchair)?: A Little Help needed walking in hospital room?: A Little Help needed climbing 3-5 steps with a railing? : Total 6 Click Score: 14    End of Session   Activity Tolerance: Patient tolerated treatment well Patient left: in bed;with call bell/phone within reach;with family/visitor present Nurse Communication: Mobility status PT Visit Diagnosis: Difficulty in walking, not elsewhere classified (R26.2);Pain Pain - Right/Left: Right Pain - part of body: Knee    Time: 3976-7341 PT Time Calculation (min) (ACUTE ONLY): 28 min   Charges:     PT Treatments $Gait Training: 8-22 mins   PT G CodesTresa [name] PT [phone]   Ross [name] 12/07/2016, 10:34 AM

## 2016-12-07 NOTE — Evaluation (Signed)
Occupational Therapy Evaluation Patient Details Name: Ross [name]. MRN: [id number] DOB: 1940-05-20 Today's Date: 12/07/2016    History of Present Illness s/p R TKA   Clinical Impression   This 76 year old man was admitted for the above sx. All education was completed.  No further OT is needed at this time     Follow Up Recommendations  Supervision/Assistance - 24 hour    Equipment Recommendations  None recommended by OT (has 3:1 commode)    Recommendations for Other Services       Precautions / Restrictions Precautions Precautions: Knee Restrictions RLE Weight Bearing: Weight bearing as tolerated      Mobility Bed Mobility Overal bed mobility: Needs Assistance Bed Mobility: Supine to Sit     Supine to sit: Min assist     General bed mobility comments: for RLE  Transfers Overall transfer level: Needs assistance Equipment used: Rolling walker (2 wheeled) Transfers: Sit to/from Stand Sit to Stand: Min guard         General transfer comment: cues for UE/LE placement    Balance                                           ADL either performed or assessed with clinical judgement   ADL Overall ADL's : Needs assistance/impaired Eating/Feeding: Independent   Grooming: Oral care;Supervision/safety;Standing   Upper Body Bathing: Set up;Sitting   Lower Body Bathing: Minimal assistance;Sit to/from stand   Upper Body Dressing : Set up;Sitting   Lower Body Dressing: Minimal assistance;Sit to/from stand   Toilet Transfer: Min guard;Ambulation;BSC;RW   Toileting- Water quality scientist and Hygiene: Min guard;Sit to/from stand   Tub/ Shower Transfer: Walk-in shower;Min guard;Ambulation     General ADL Comments: practiced bathroom transfers. Cues for safety with toilet transfer and educated on general safety     Vision         Perception     Praxis      Pertinent Vitals/Pain Pain Assessment: Faces Faces Pain Scale: Hurts  little more Pain Location: R knee Pain Descriptors / Indicators: Aching Pain Intervention(s): Limited activity within patient's tolerance;Monitored during session;Premedicated before session (handed off  to PT)     Hand Dominance     Extremity/Trunk Assessment Upper Extremity Assessment Upper Extremity Assessment: Overall WFL for tasks assessed           Communication Communication Communication: No difficulties   Cognition Arousal/Alertness: Awake/alert Behavior During Therapy: WFL for tasks assessed/performed Overall Cognitive Status: Within Functional Limits for tasks assessed                                     General Comments       Exercises     Shoulder Instructions      Home Living Family/patient expects to be discharged to:: Private residence Living Arrangements: Spouse/significant other                               Additional Comments: has walk in shower and 3:1 commode      Prior Functioning/Environment Level of Independence: Independent                 OT Problem List:        OT Treatment/Interventions:  OT Goals(Current goals can be found in the care plan section) Acute Rehab OT Goals Patient Stated Goal: return to independence OT Goal Formulation: All assessment and education complete, DC therapy  OT Frequency:     Barriers to D/C:            Co-evaluation              AM-PAC PT "6 Clicks" Daily Activity     Outcome Measure Help from another person eating meals?: None Help from another person taking care of personal grooming?: A Little Help from another person toileting, which includes using toliet, bedpan, or urinal?: A Little Help from another person bathing (including washing, rinsing, drying)?: A Little Help from another person to put on and taking off regular upper body clothing?: A Little Help from another person to put on and taking off regular lower body clothing?: A Little 6 Click  Score: 19   End of Session    Activity Tolerance: Patient tolerated treatment well Patient left:  (with PT)  OT Visit Diagnosis: Pain Pain - Right/Left: Right Pain - part of body: Knee                Time: 8325-4982 OT Time Calculation (min): 20 min Charges:  OT General Charges $OT Visit: 1 Procedure OT Evaluation $OT Eval Low Complexity: 1 Procedure G-Codes:     Lesle Chris, OTR/L [phone] 12/07/2016  Kaspian Muccio 12/07/2016, 9:57 AM

## 2016-12-07 NOTE — Clinical Social Work Note (Addendum)
CSW acknowledges consult for SNF, pt did very well with PT and will go home with spouse and do outpt PT. Pt has DME.   No other DC needs identified.   Shaley Leavens B. Joline Maxcy Clinical Social Work Dept Weekend Social Worker [phone] 9:52 AM

## 2016-12-07 NOTE — Anesthesia Postprocedure Evaluation (Signed)
Anesthesia Post Note  Patient: Ross [name].  Procedure(s) Performed: Procedure(s) (LRB): RIGHT TOTAL KNEE ARTHROPLASTY (Right)     Patient location during evaluation: PACU Anesthesia Type: Spinal Level of consciousness: awake and alert Pain management: pain level controlled Vital Signs Assessment: post-procedure vital signs reviewed and stable Respiratory status: spontaneous breathing and respiratory function stable Cardiovascular status: blood pressure returned to baseline and stable Postop Assessment: no headache, no backache and spinal receding Anesthetic complications: no    Last Vitals:  Vitals:   12/07/16 0635 12/07/16 1037  BP: 126/60 (!) 140/47  Pulse: 75 62  Resp: 17 15  Temp: 36.7 C 36.8 C  SpO2: 94% 95%    Last Pain:  Vitals:   12/07/16 1200  TempSrc:   PainSc: 2                  Montez Hageman

## 2016-12-07 NOTE — Progress Notes (Signed)
   Subjective: 1 Day Post-Op Procedure(s) (LRB): RIGHT TOTAL KNEE ARTHROPLASTY (Right) Patient reports pain as mild.   Patient seen in rounds with Dr. Huntley Dec. Patient is well, and has had no acute complaints or problems other than some soreness in the right knee and hiccups. No SOB or chest pain.    Objective: Vital signs in last 24 hours: Temp:  [97.4 F (36.3 C)-98.1 F (36.7 C)] 98.1 F (36.7 C) (08/15 0635) Pulse Rate:  [57-98] 75 (08/15 0635) Resp:  [10-42] 17 (08/15 0635) BP: (112-142)/(48-73) 126/60 (08/15 0635) SpO2:  [91 %-100 %] 94 % (08/15 0635) Weight:  [83.9 kg (185 lb)] 83.9 kg (185 lb) (08/14 0920)  Intake/Output from previous day:  Intake/Output Summary (Last 24 hours) at 12/07/16 0802 Last data filed at 12/07/16 0639  Gross per 24 hour  Intake             3900 ml  Output             2050 ml  Net             1850 ml     Labs:  Recent Labs  12/07/16 0552  HGB 13.0    Recent Labs  12/07/16 0552  WBC 10.3  RBC 4.13*  HCT 37.8*  PLT 177    Recent Labs  12/07/16 0552  NA 136  K 4.7  CL 103  CO2 26  BUN 16  CREATININE 0.83  GLUCOSE 119*  CALCIUM 8.4*    EXAM General - Patient is Alert and Oriented Extremity - Neurologically intact Intact pulses distally Dorsiflexion/Plantar flexion intact No cellulitis present Compartment soft Dressing/Incision - clean, dry, no drainage Motor Function - intact, moving foot and toes well on exam.   Past Medical History:  Diagnosis Date  . 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 Essential and other specified forms of tremor 08/30/2013  . Hypertension   . Kidney stones   . Stroke [facility]) 2005    Assessment/Plan: 1 Day Post-Op Procedure(s) (LRB): RIGHT TOTAL KNEE ARTHROPLASTY (Right) Active Problems:   S/P total knee arthroplasty, right  Estimated body mass index is 28.13 kg/m as calculated from the following:   Height as of this encounter: 5\' 8"  (1.727 m).   Weight as of this encounter: 83.9 kg (185  lb). Advance diet Up with therapy D/C IV fluids when tolerating POs well  DVT Prophylaxis - Aspirin Weight-Bearing as tolerated   Will have him get up with therapy today. Will give thorazine for hiccups. Plan for DC home today if he progresses with therapy.   Ardeen Jourdain, PA-C Orthopaedic Surgery 12/07/2016, 8:02 AM

## 2016-12-07 NOTE — Discharge Instructions (Signed)

## 2016-12-07 NOTE — Progress Notes (Signed)
Physical Therapy Treatment Patient Details Name: Ross [name]. MRN: [id number] DOB: [birth year] Today's Date: 12/07/2016    History of Present Illness s/p R TKA    PT Comments    The  Patient is progressing well. Ready for DC.   Follow Up Recommendations  Outpatient PT;DC plan and follow up therapy as arranged by surgeon     Equipment Recommendations  None recommended by PT    Recommendations for Other Services       Precautions / Restrictions Precautions Precautions: Knee Restrictions Weight Bearing Restrictions: No RLE Weight Bearing: Weight bearing as tolerated    Mobility  Bed Mobility                  Transfers   Equipment used: Rolling walker (2 wheeled) Transfers: Sit to/from Stand Sit to Stand: Supervision         General transfer comment: cues for UE/LE placement  Ambulation/Gait Ambulation/Gait assistance: Min guard Ambulation Distance (Feet): 120 Feet Assistive device: Rolling walker (2 wheeled) Gait Pattern/deviations: Step-to pattern;Antalgic;Decreased step length - right;Decreased stance time - right     General Gait Details: cues for sequence   Stairs Stairs: Yes   Stair Management: Step to pattern;Forwards;With cane Number of Stairs: 2 General stair comments: simulated a refrigerator on the right for support. wife present to assist. on second practice  Wheelchair Mobility    Modified Rankin (Stroke Patients Only)       Balance                                            Cognition Arousal/Alertness: Awake/alert Behavior During Therapy: WFL for tasks assessed/performed Overall Cognitive Status: Within Functional Limits for tasks assessed                                        Exercises Total Joint Exercises Ankle Circles/Pumps: AROM;Both;10 reps Quad Sets: AROM;Both;10 reps Short Arc QuadSinclair Ship;Right;10 reps Heel Slides: AAROM;Right;10 reps Hip ABduction/ADduction:  AAROM;Right;10 reps Straight Leg Raises: AAROM;Right;10 reps    General Comments        Pertinent Vitals/Pain Faces Pain Scale: Hurts little more Pain Location: R knee Pain Descriptors / Indicators: Aching Pain Intervention(s): Monitored during session;Premedicated before session;Ice applied    Home Living Family/patient expects to be discharged to:: Private residence Living Arrangements: Spouse/significant other Available Help at Discharge: Family Type of Home: House Home Access: Stairs to enter Entrance Stairs-Rails: None Home Layout: Two level;Able to live on main level with bedroom/bathroom Home Equipment: Gilford Rile - 2 wheels;Bedside commode      Prior Function            PT Goals (current goals can now be found in the care plan section) Acute Rehab PT Goals Patient Stated Goal: return to independence PT Goal Formulation: With patient/family Time For Goal Achievement: 12/10/16 Potential to Achieve Goals: Good Progress towards PT goals: Progressing toward goals    Frequency    7X/week      PT Plan Current plan remains appropriate    Co-evaluation              AM-PAC PT "6 Clicks" Daily Activity  Outcome Measure  Difficulty turning over in bed (including adjusting bedclothes, sheets and blankets)?: A Little Difficulty moving from lying on back  to sitting on the side of the bed? : A Little Difficulty sitting down on and standing up from a chair with arms (e.g., wheelchair, bedside commode, etc,.)?: A Little Help needed moving to and from a bed to chair (including a wheelchair)?: A Little Help needed walking in hospital room?: A Little Help needed climbing 3-5 steps with a railing? : A Little 6 Click Score: 18    End of Session   Activity Tolerance: Patient tolerated treatment well Patient left: in chair;with call bell/phone within reach Nurse Communication: Mobility status PT Visit Diagnosis: Difficulty in walking, not elsewhere classified  (R26.2);Pain Pain - Right/Left: Right Pain - part of body: Knee     Time: 7915-0413 PT Time Calculation (min) (ACUTE ONLY): 22 min  Charges:  $Gait Training: 8-22 mins                    G CodesTresa Endo PT [phone]    Claretha Cooper 12/07/2016, 1:04 PM

## 2016-12-08 NOTE — Discharge Summary (Signed)
Physician Discharge Summary   Patient ID: Ross [name]. MRN: [id number] DOB/AGE: 1940-07-07 77 y.o.  Admit date: 12/06/2016 Discharge date: 12/07/2016  Primary Diagnosis: Primary osteoarthritis right knee   Admission Diagnoses:  Past Medical History:  Diagnosis Date  . 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 Essential and other specified forms of tremor 08/30/2013  . Hypertension   . Kidney stones   . Stroke [facility]) 2005   Discharge Diagnoses:   Active Problems:   S/P total knee arthroplasty, right  Estimated body mass index is 28.13 kg/m as calculated from the following:   Height as of this encounter: _0  (1.727 m).   Weight as of this encounter: 83.9 kg (185 lb).  Procedure:  Procedure(s) (LRB): RIGHT TOTAL KNEE ARTHROPLASTY (Right)   Consults: None  HPI: Ross [name]., 76 y.o. male, has a history of pain and functional disability in the right knee due to arthritis and has failed non-surgical conservative treatments for greater than 12 weeks to include NSAID's and/or analgesics, corticosteriod injections, viscosupplementation injections, supervised PT with diminished ADL's post treatment and activity modification.  Onset of symptoms was gradual, starting ~2 years ago with gradually worsening course since that time. The patient noted prior procedures on the knee to include  arthroscopy and menisectomy on the right knee(s).  Patient currently rates pain in the right knee(s) at 3 out of 10 with activity, but constant pain as well. Patient has night pain, worsening of pain with activity and weight bearing, pain that interferes with activities of daily living, pain with passive range of motion, crepitus and joint swelling.  Patient has evidence of periarticular osteophytes and joint space narrowing by imaging studies.  There is no active infection.  Risks, benefits and expectations were discussed with the patient.  Risks including but not limited to the risk of anesthesia, blood  clots, nerve damage, blood vessel damage, failure of the prosthesis, infection and up to and including death.  Patient understand the risks, benefits and expectations and wishes to proceed with surgery.   Laboratory Data: Admission on 12/06/2016, Discharged on 12/07/2016  Component Date Value Ref Range Status  . WBC 12/07/2016 10.3  4.0 - 10.5 K/uL Final  . RBC 12/07/2016 4.13* 4.22 - 5.81 MIL/uL Final  . Hemoglobin 12/07/2016 13.0  13.0 - 17.0 g/dL Final  . HCT 12/07/2016 37.8* 39.0 - 52.0 % Final  . MCV 12/07/2016 91.5  78.0 - 100.0 fL Final  . MCH 12/07/2016 31.5  26.0 - 34.0 pg Final  . MCHC 12/07/2016 34.4  30.0 - 36.0 g/dL Final  . RDW 12/07/2016 12.4  11.5 - 15.5 % Final  . Platelets 12/07/2016 177  150 - 400 K/uL Final  . Sodium 12/07/2016 136  135 - 145 mmol/L Final  . Potassium 12/07/2016 4.7  3.5 - 5.1 mmol/L Final  . Chloride 12/07/2016 103  101 - 111 mmol/L Final  . CO2 12/07/2016 26  22 - 32 mmol/L Final  . Glucose, Bld 12/07/2016 119* 65 - 99 mg/dL Final  . BUN 12/07/2016 16  6 - 20 mg/dL Final  . Creatinine, Ser 12/07/2016 0.83  0.61 - 1.24 mg/dL Final  . Calcium 12/07/2016 8.4* 8.9 - 10.3 mg/dL Final  . GFR calc non Af Amer 12/07/2016 >60  >60 mL/min Final  . GFR calc Af Amer 12/07/2016 >60  >60 mL/min Final   Comment: (NOTE) The eGFR has been calculated using the CKD EPI equation. This calculation has not been  validated in all clinical situations. eGFR's persistently <60 mL/min signify possible Chronic Kidney Disease.   Ross [name] gap 12/07/2016 7  5 - 15 Final  Hospital Outpatient Visit on 11/28/2016  Component Date Value Ref Range Status  . Sodium 11/28/2016 142  135 - 145 mmol/L Final  . Potassium 11/28/2016 4.6  3.5 - 5.1 mmol/L Final  . Chloride 11/28/2016 105  101 - 111 mmol/L Final  . CO2 11/28/2016 28  22 - 32 mmol/L Final  . Glucose, Bld 11/28/2016 106* 65 - 99 mg/dL Final  . BUN 11/28/2016 19  6 - 20 mg/dL Final  . Creatinine, Ser 11/28/2016 0.92  0.61  - 1.24 mg/dL Final  . Calcium 11/28/2016 9.6  8.9 - 10.3 mg/dL Final  . GFR calc non Af Amer 11/28/2016 >60  >60 mL/min Final  . GFR calc Af Amer 11/28/2016 >60  >60 mL/min Final   Comment: (NOTE) The eGFR has been calculated using the CKD EPI equation. This calculation has not been validated in all clinical situations. eGFR's persistently <60 mL/min signify possible Chronic Kidney Disease.   . Anion gap 11/28/2016 9  5 - 15 Final  . WBC 11/28/2016 5.3  4.0 - 10.5 K/uL Final  . RBC 11/28/2016 4.86  4.22 - 5.81 MIL/uL Final  . Hemoglobin 11/28/2016 15.7  13.0 - 17.0 g/dL Final  . HCT 11/28/2016 45.8  39.0 - 52.0 % Final  . MCV 11/28/2016 94.2  78.0 - 100.0 fL Final  . MCH 11/28/2016 32.3  26.0 - 34.0 pg Final  . MCHC 11/28/2016 34.3  30.0 - 36.0 g/dL Final  . RDW 11/28/2016 13.0  11.5 - 15.5 % Final  . Platelets 11/28/2016 182  150 - 400 K/uL Final  . ABO/RH(D) 11/28/2016 O POS   Final  . Antibody Screen 11/28/2016 NEG   Final  . Sample Expiration 11/28/2016 12/09/2016   Final  . Extend sample reason 11/28/2016 NO TRANSFUSIONS OR PREGNANCY IN THE PAST 3 MONTHS   Final  . MRSA, PCR 11/28/2016 NEGATIVE  NEGATIVE Final  . Staphylococcus aureus 11/28/2016 NEGATIVE  NEGATIVE Final   Comment:        The Xpert SA Assay (FDA approved for NASAL specimens in patients over 39 years of age), is one component of a comprehensive surveillance program.  Test performance has been validated by [facility] for patients greater than or equal to 68 year old. It is not intended to diagnose infection nor to guide or monitor treatment.   . ABO/RH(D) 11/28/2016 O POS   Final      Hospital Course: Ross [name]. is a 76 y.o. who was admitted to [facility]. They were brought to the operating room on 12/06/2016 and underwent Procedure(s): RIGHT TOTAL KNEE ARTHROPLASTY.  Patient tolerated the procedure well and was later transferred to the recovery room and then to the orthopaedic floor  for postoperative care.  They were given PO and IV analgesics for pain control following their surgery.  They were given 24 hours of postoperative antibiotics of  Anti-infectives    Start     Dose/Rate Route Frequency Ordered Stop   12/06/16 1700  ceFAZolin (ANCEF) IVPB 1 g/50 mL premix     1 g 100 mL/hr over 30 Minutes Intravenous Every 6 hours 12/06/16 1458 12/06/16 2250   12/06/16 0925  ceFAZolin (ANCEF) 2-4 GM/100ML-% IVPB    Comments:  Waldron Session   : cabinet override      12/06/16 [id number] 12/06/16 1122  12/06/16 0912  ceFAZolin (ANCEF) IVPB 2g/100 mL premix     2 g 200 mL/hr over 30 Minutes Intravenous On call to O.R. 12/06/16 0912 12/06/16 1122     and started on DVT prophylaxis in the form of Aspirin.   PT and OT were ordered for total joint protocol.  Discharge planning consulted to help with postop disposition and equipment needs.  Patient had a good night on the evening of surgery.  They started to get up OOB with therapy on day one. Patient was seen in rounds and was ready to go home.   Diet: Cardiac diet Activity:WBAT Follow-up:in 2 weeks Disposition - Home Discharged Condition: stable   Discharge Instructions    Call MD / Call 911    Complete by:  As directed    If you experience chest pain or shortness of breath, CALL 911 and be transported to the hospital emergency room.  If you develope a fever above 101 F, pus (white drainage) or increased drainage or redness at the wound, or calf pain, call your surgeon's office.   Constipation Prevention    Complete by:  As directed    Drink plenty of fluids.  Prune juice may be helpful.  You may use a stool softener, such as Colace (over the counter) 100 mg twice a day.  Use MiraLax (over the counter) for constipation as needed.   Diet - low sodium heart healthy    Complete by:  As directed    Discharge instructions    Complete by:  As directed    INSTRUCTIONS AFTER JOINT REPLACEMENT   Remove items at home which could result  in a fall. This includes throw rugs or furniture in walking pathways ICE to the affected joint every three hours while awake for 30 minutes at a time, for at least the first 3-5 days, and then as needed for pain and swelling.  Continue to use ice for pain and swelling. You may notice swelling that will progress down to the foot and ankle.  This is normal after surgery.  Elevate your leg when you are not up walking on it.   Continue to use the breathing machine you got in the hospital (incentive spirometer) which will help keep your temperature down.  It is common for your temperature to cycle up and down following surgery, especially at night when you are not up moving around and exerting yourself.  The breathing machine keeps your lungs expanded and your temperature down.   DIET:  As you were doing prior to hospitalization, we recommend a well-balanced diet.  DRESSING / WOUND CARE / SHOWERING  Keep the surgical dressing until follow up.  The dressing is water proof, so you can shower without any extra covering.  IF THE DRESSING FALLS OFF or the wound gets wet inside, change the dressing with sterile gauze.  Please use good hand washing techniques before changing the dressing.  Do not use any lotions or creams on the incision until instructed by your surgeon.    ACTIVITY  Increase activity slowly as tolerated, but follow the weight bearing instructions below.   No driving for 6 weeks or until further direction given by your physician.  You cannot drive while taking narcotics.  No lifting or carrying greater than 10 lbs. until further directed by your surgeon. Avoid periods of inactivity such as sitting longer than an hour when not asleep. This helps prevent blood clots.  You may return to work once you are authorized  by your doctor.     WEIGHT BEARING   Weight bearing as tolerated with assist device (walker, cane, etc) as directed, use it as long as suggested by your surgeon or therapist,  typically at least 4-6 weeks.   EXERCISES  Results after joint replacement surgery are often greatly improved when you follow the exercise, range of motion and muscle strengthening exercises prescribed by your doctor. Safety measures are also important to protect the joint from further injury. Any time any of these exercises cause you to have increased pain or swelling, decrease what you are doing until you are comfortable again and then slowly increase them. If you have problems or questions, call your caregiver or physical therapist for advice.   Rehabilitation is important following a joint replacement. After just a few days of immobilization, the muscles of the leg can become weakened and shrink (atrophy).  These exercises are designed to build up the tone and strength of the thigh and leg muscles and to improve motion. Often times heat used for twenty to thirty minutes before working out will loosen up your tissues and help with improving the range of motion but do not use heat for the first two weeks following surgery (sometimes heat can increase post-operative swelling).   These exercises can be done on a training (exercise) mat, on the floor, on a table or on a bed. Use whatever works the best and is most comfortable for you.    Use music or television while you are exercising so that the exercises are a pleasant break in your day. This will make your life better with the exercises acting as a break in your routine that you can look forward to.   Perform all exercises about fifteen times, three times per day or as directed.  You should exercise both the operative leg and the other leg as well.  Exercises include:   Quad Sets - Tighten up the muscle on the front of the thigh (Quad) and hold for 5-10 seconds.   Straight Leg Raises - With your knee straight (if you were given a brace, keep it on), lift the leg to 60 degrees, hold for 3 seconds, and slowly lower the leg.  Perform this exercise  against resistance later as your leg gets stronger.  Leg Slides: Lying on your back, slowly slide your foot toward your buttocks, bending your knee up off the floor (only go as far as is comfortable). Then slowly slide your foot back down until your leg is flat on the floor again.  Angel Wings: Lying on your back spread your legs to the side as far apart as you can without causing discomfort.  Hamstring Strength:  Lying on your back, push your heel against the floor with your leg straight by tightening up the muscles of your buttocks.  Repeat, but this time bend your knee to a comfortable angle, and push your heel against the floor.  You may put a pillow under the heel to make it more comfortable if necessary.   A rehabilitation program following joint replacement surgery can speed recovery and prevent re-injury in the future due to weakened muscles. Contact your doctor or a physical therapist for more information on knee rehabilitation.    CONSTIPATION  Constipation is defined medically as fewer than three stools per week and severe constipation as less than one stool per week.  Even if you have a regular bowel pattern at home, your normal regimen is likely to be disrupted  due to multiple reasons following surgery.  Combination of anesthesia, postoperative narcotics, change in appetite and fluid intake all can affect your bowels.   YOU MUST use at least one of the following options; they are listed in order of increasing strength to get the job done.  They are all available over the counter, and you may need to use some, POSSIBLY even all of these options:    Drink plenty of fluids (prune juice may be helpful) and high fiber foods Colace 100 mg by mouth twice a day  Senokot for constipation as directed and as needed Dulcolax (bisacodyl), take with full glass of water  Miralax (polyethylene glycol) once or twice a day as needed.  If you have tried all these things and are unable to have a bowel  movement in the first 3-4 days after surgery call either your surgeon or your primary doctor.    If you experience loose stools or diarrhea, hold the medications until you stool forms back up.  If your symptoms do not get better within 1 week or if they get worse, check with your doctor.  If you experience "the worst abdominal pain ever" or develop nausea or vomiting, please contact the office immediately for further recommendations for treatment.   ITCHING:  If you experience itching with your medications, try taking only a single pain pill, or even half a pain pill at a time.  You can also use Benadryl over the counter for itching or also to help with sleep.   TED HOSE STOCKINGS:  Use stockings on both legs until for at least 2 weeks or as directed by physician office. They may be removed at night for sleeping.  MEDICATIONS:  See your medication summary on the "After Visit Summary" that nursing will review with you.  You may have some home medications which will be placed on hold until you complete the course of blood thinner medication.  It is important for you to complete the blood thinner medication as prescribed.  PRECAUTIONS:  If you experience chest pain or shortness of breath - call 911 immediately for transfer to the hospital emergency department.   If you develop a fever greater that 101 F, purulent drainage from wound, increased redness or drainage from wound, foul odor from the wound/dressing, or calf pain - CONTACT YOUR SURGEON.                                                   FOLLOW-UP APPOINTMENTS:  If you do not already have a post-op appointment, please call the office for an appointment to be seen by your surgeon.  Guidelines for how soon to be seen are listed in your "After Visit Summary", but are typically between 1-4 weeks after surgery.   MAKE SURE YOU:  Understand these instructions.  Get help right away if you are not doing well or get worse.    Thank you for letting  us be a part of your medical care team.  It is a privilege we respect greatly.  We hope these instructions will help you stay on track for a fast and full recovery!   Increase activity slowly as tolerated    Complete by:  As directed      Allergies as of 12/07/2016   No Known Allergies     Medication List  STOP taking these medications   acetaminophen 500 MG tablet Commonly known as:  TYLENOL   aspirin EC 81 MG tablet Replaced by:  aspirin 81 MG chewable tablet     TAKE these medications   allopurinol 100 MG tablet Commonly known as:  ZYLOPRIM Take 100 mg by mouth at bedtime.   ARTIFICIAL TEARS OP Apply 1 drop to eye daily.   aspirin 81 MG chewable tablet Chew 1 tablet (81 mg total) by mouth 2 (two) times daily. Replaces:  aspirin EC 81 MG tablet   b complex vitamins tablet Take 1 tablet by mouth daily.   cetirizine 10 MG tablet Commonly known as:  ZYRTEC Take 10 mg by mouth every morning.   docusate sodium 100 MG capsule Commonly known as:  COLACE Take 1 capsule (100 mg total) by mouth 2 (two) times daily.   EPINEPHrine 0.3 mg/0.3 mL Soaj injection Commonly known as:  EPI-PEN Inject 0.3 mg into the muscle daily as needed (allergic reaction).   ferrous sulfate 325 (65 FE) MG tablet Take 1 tablet (325 mg total) by mouth 2 (two) times daily with a meal.   fluticasone 50 MCG/ACT nasal spray Commonly known as:  FLONASE Place 1 spray into both nostrils daily.   GLUCOSAMINE-MSM DS PO Take 2 capsules by mouth 2 (two) times daily. [place] AND MSM   HYDROcodone-acetaminophen 7.5-325 MG tablet Commonly known as:  NORCO Take 1-2 tablets by mouth every 4 (four) hours as needed (breakthrough pain).   ketoconazole 2 % cream Commonly known as:  NIZORAL Apply 1 application topically daily as needed for irritation.   lisinopril 20 MG tablet Commonly known as:  PRINIVIL,ZESTRIL Take 20 mg by mouth at bedtime.   lisinopril-hydrochlorothiazide  10-12.5 MG tablet Commonly known as:  PRINZIDE,ZESTORETIC Take 1 tablet by mouth at bedtime.   methocarbamol 500 MG tablet Commonly known as:  ROBAXIN Take 1 tablet (500 mg total) by mouth every 6 (six) hours as needed for muscle spasms.   multivitamin tablet Take 1 tablet by mouth daily. With lunch and at night.   MYRBETRIQ 50 MG Tb24 tablet Generic drug:  mirabegron ER Take 50 mg by mouth at bedtime.   naproxen sodium 220 MG tablet Commonly known as:  ANAPROX Take 440 mg by mouth daily as needed (PAIN).   polyethylene glycol packet Commonly known as:  MIRALAX / GLYCOLAX Take 17 g by mouth daily as needed for mild constipation.   Potassium Citrate 15 MEQ (1620 MG) Tbcr Take 2 tablets by mouth 2 (two) times daily.   PRESCRIPTION MEDICATION 1 each by Subconjunctival route every 30 (thirty) days. Allergy shot   QUNOL COQ10/UBIQUINOL/MEGA 100 MG Caps Generic drug:  Ubiquinol Take by mouth.   sodium chloride 5 % ophthalmic solution Commonly known as:  MURO 128 Place 1 drop into both eyes 2 (two) times daily.   tamsulosin 0.4 MG Caps capsule Commonly known as:  FLOMAX Take 0.4 mg by mouth daily after supper.   Vitamin D 2000 units Caps Take 2,000 Units by mouth daily.      Follow-up Information    Paralee Cancel, MD. Schedule an appointment as soon as possible for a visit in 2 week(s).   Specialty:  Orthopedic Surgery Contact information: [address] [place] [address] [phone]           Signed: Ardeen Jourdain, PA-C Orthopaedic Surgery 12/08/2016, 11:12 AM

## 2016-12-12 DIAGNOSIS — M1711 Unilateral primary osteoarthritis, right knee: Secondary | ICD-10-CM | POA: Diagnosis not present

## 2016-12-12 DIAGNOSIS — M6281 Muscle weakness (generalized): Secondary | ICD-10-CM | POA: Diagnosis not present

## 2016-12-12 DIAGNOSIS — R262 Difficulty in walking, not elsewhere classified: Secondary | ICD-10-CM | POA: Diagnosis not present

## 2016-12-12 DIAGNOSIS — M25661 Stiffness of right knee, not elsewhere classified: Secondary | ICD-10-CM | POA: Diagnosis not present

## 2016-12-15 DIAGNOSIS — M25661 Stiffness of right knee, not elsewhere classified: Secondary | ICD-10-CM | POA: Diagnosis not present

## 2016-12-15 DIAGNOSIS — R262 Difficulty in walking, not elsewhere classified: Secondary | ICD-10-CM | POA: Diagnosis not present

## 2016-12-15 DIAGNOSIS — M6281 Muscle weakness (generalized): Secondary | ICD-10-CM | POA: Diagnosis not present

## 2016-12-15 DIAGNOSIS — M1711 Unilateral primary osteoarthritis, right knee: Secondary | ICD-10-CM | POA: Diagnosis not present

## 2016-12-19 DIAGNOSIS — M6281 Muscle weakness (generalized): Secondary | ICD-10-CM | POA: Diagnosis not present

## 2016-12-19 DIAGNOSIS — R262 Difficulty in walking, not elsewhere classified: Secondary | ICD-10-CM | POA: Diagnosis not present

## 2016-12-19 DIAGNOSIS — M25661 Stiffness of right knee, not elsewhere classified: Secondary | ICD-10-CM | POA: Diagnosis not present

## 2016-12-19 DIAGNOSIS — M1711 Unilateral primary osteoarthritis, right knee: Secondary | ICD-10-CM | POA: Diagnosis not present

## 2016-12-21 DIAGNOSIS — M6281 Muscle weakness (generalized): Secondary | ICD-10-CM | POA: Diagnosis not present

## 2016-12-21 DIAGNOSIS — M1711 Unilateral primary osteoarthritis, right knee: Secondary | ICD-10-CM | POA: Diagnosis not present

## 2016-12-21 DIAGNOSIS — M25661 Stiffness of right knee, not elsewhere classified: Secondary | ICD-10-CM | POA: Diagnosis not present

## 2016-12-21 DIAGNOSIS — R262 Difficulty in walking, not elsewhere classified: Secondary | ICD-10-CM | POA: Diagnosis not present

## 2016-12-27 DIAGNOSIS — M25661 Stiffness of right knee, not elsewhere classified: Secondary | ICD-10-CM | POA: Diagnosis not present

## 2016-12-27 DIAGNOSIS — R262 Difficulty in walking, not elsewhere classified: Secondary | ICD-10-CM | POA: Diagnosis not present

## 2016-12-27 DIAGNOSIS — M6281 Muscle weakness (generalized): Secondary | ICD-10-CM | POA: Diagnosis not present

## 2016-12-27 DIAGNOSIS — M1711 Unilateral primary osteoarthritis, right knee: Secondary | ICD-10-CM | POA: Diagnosis not present

## 2016-12-29 DIAGNOSIS — R2231 Localized swelling, mass and lump, right upper limb: Secondary | ICD-10-CM | POA: Diagnosis not present

## 2016-12-29 DIAGNOSIS — M6281 Muscle weakness (generalized): Secondary | ICD-10-CM | POA: Diagnosis not present

## 2016-12-29 DIAGNOSIS — R262 Difficulty in walking, not elsewhere classified: Secondary | ICD-10-CM | POA: Diagnosis not present

## 2016-12-29 DIAGNOSIS — M25661 Stiffness of right knee, not elsewhere classified: Secondary | ICD-10-CM | POA: Diagnosis not present

## 2016-12-29 DIAGNOSIS — M1711 Unilateral primary osteoarthritis, right knee: Secondary | ICD-10-CM | POA: Diagnosis not present

## 2017-01-02 DIAGNOSIS — M1711 Unilateral primary osteoarthritis, right knee: Secondary | ICD-10-CM | POA: Diagnosis not present

## 2017-01-02 DIAGNOSIS — J301 Allergic rhinitis due to pollen: Secondary | ICD-10-CM | POA: Diagnosis not present

## 2017-01-02 DIAGNOSIS — R262 Difficulty in walking, not elsewhere classified: Secondary | ICD-10-CM | POA: Diagnosis not present

## 2017-01-02 DIAGNOSIS — M25661 Stiffness of right knee, not elsewhere classified: Secondary | ICD-10-CM | POA: Diagnosis not present

## 2017-01-02 DIAGNOSIS — J3089 Other allergic rhinitis: Secondary | ICD-10-CM | POA: Diagnosis not present

## 2017-01-02 DIAGNOSIS — M6281 Muscle weakness (generalized): Secondary | ICD-10-CM | POA: Diagnosis not present

## 2017-01-05 DIAGNOSIS — M1711 Unilateral primary osteoarthritis, right knee: Secondary | ICD-10-CM | POA: Diagnosis not present

## 2017-01-05 DIAGNOSIS — M25661 Stiffness of right knee, not elsewhere classified: Secondary | ICD-10-CM | POA: Diagnosis not present

## 2017-01-05 DIAGNOSIS — R262 Difficulty in walking, not elsewhere classified: Secondary | ICD-10-CM | POA: Diagnosis not present

## 2017-01-05 DIAGNOSIS — M6281 Muscle weakness (generalized): Secondary | ICD-10-CM | POA: Diagnosis not present

## 2017-01-10 DIAGNOSIS — M6281 Muscle weakness (generalized): Secondary | ICD-10-CM | POA: Diagnosis not present

## 2017-01-10 DIAGNOSIS — R262 Difficulty in walking, not elsewhere classified: Secondary | ICD-10-CM | POA: Diagnosis not present

## 2017-01-10 DIAGNOSIS — M25661 Stiffness of right knee, not elsewhere classified: Secondary | ICD-10-CM | POA: Diagnosis not present

## 2017-01-10 DIAGNOSIS — M1711 Unilateral primary osteoarthritis, right knee: Secondary | ICD-10-CM | POA: Diagnosis not present

## 2017-01-12 DIAGNOSIS — R262 Difficulty in walking, not elsewhere classified: Secondary | ICD-10-CM | POA: Diagnosis not present

## 2017-01-12 DIAGNOSIS — M6281 Muscle weakness (generalized): Secondary | ICD-10-CM | POA: Diagnosis not present

## 2017-01-12 DIAGNOSIS — M25661 Stiffness of right knee, not elsewhere classified: Secondary | ICD-10-CM | POA: Diagnosis not present

## 2017-01-12 DIAGNOSIS — M1711 Unilateral primary osteoarthritis, right knee: Secondary | ICD-10-CM | POA: Diagnosis not present

## 2017-01-16 DIAGNOSIS — M6281 Muscle weakness (generalized): Secondary | ICD-10-CM | POA: Diagnosis not present

## 2017-01-16 DIAGNOSIS — M25661 Stiffness of right knee, not elsewhere classified: Secondary | ICD-10-CM | POA: Diagnosis not present

## 2017-01-16 DIAGNOSIS — M1711 Unilateral primary osteoarthritis, right knee: Secondary | ICD-10-CM | POA: Diagnosis not present

## 2017-01-16 DIAGNOSIS — R262 Difficulty in walking, not elsewhere classified: Secondary | ICD-10-CM | POA: Diagnosis not present

## 2017-01-18 DIAGNOSIS — M25661 Stiffness of right knee, not elsewhere classified: Secondary | ICD-10-CM | POA: Diagnosis not present

## 2017-01-18 DIAGNOSIS — M6281 Muscle weakness (generalized): Secondary | ICD-10-CM | POA: Diagnosis not present

## 2017-01-18 DIAGNOSIS — R262 Difficulty in walking, not elsewhere classified: Secondary | ICD-10-CM | POA: Diagnosis not present

## 2017-01-18 DIAGNOSIS — M1711 Unilateral primary osteoarthritis, right knee: Secondary | ICD-10-CM | POA: Diagnosis not present

## 2017-01-19 DIAGNOSIS — Z471 Aftercare following joint replacement surgery: Secondary | ICD-10-CM | POA: Diagnosis not present

## 2017-01-19 DIAGNOSIS — Z96651 Presence of right artificial knee joint: Secondary | ICD-10-CM | POA: Diagnosis not present

## 2017-01-24 DIAGNOSIS — M1711 Unilateral primary osteoarthritis, right knee: Secondary | ICD-10-CM | POA: Diagnosis not present

## 2017-01-24 DIAGNOSIS — R262 Difficulty in walking, not elsewhere classified: Secondary | ICD-10-CM | POA: Diagnosis not present

## 2017-01-24 DIAGNOSIS — M25661 Stiffness of right knee, not elsewhere classified: Secondary | ICD-10-CM | POA: Diagnosis not present

## 2017-01-24 DIAGNOSIS — M6281 Muscle weakness (generalized): Secondary | ICD-10-CM | POA: Diagnosis not present

## 2017-01-26 DIAGNOSIS — M1711 Unilateral primary osteoarthritis, right knee: Secondary | ICD-10-CM | POA: Diagnosis not present

## 2017-01-26 DIAGNOSIS — R262 Difficulty in walking, not elsewhere classified: Secondary | ICD-10-CM | POA: Diagnosis not present

## 2017-01-26 DIAGNOSIS — M25661 Stiffness of right knee, not elsewhere classified: Secondary | ICD-10-CM | POA: Diagnosis not present

## 2017-01-26 DIAGNOSIS — M6281 Muscle weakness (generalized): Secondary | ICD-10-CM | POA: Diagnosis not present

## 2017-01-31 DIAGNOSIS — R262 Difficulty in walking, not elsewhere classified: Secondary | ICD-10-CM | POA: Diagnosis not present

## 2017-01-31 DIAGNOSIS — M25661 Stiffness of right knee, not elsewhere classified: Secondary | ICD-10-CM | POA: Diagnosis not present

## 2017-01-31 DIAGNOSIS — J301 Allergic rhinitis due to pollen: Secondary | ICD-10-CM | POA: Diagnosis not present

## 2017-01-31 DIAGNOSIS — M6281 Muscle weakness (generalized): Secondary | ICD-10-CM | POA: Diagnosis not present

## 2017-01-31 DIAGNOSIS — M1711 Unilateral primary osteoarthritis, right knee: Secondary | ICD-10-CM | POA: Diagnosis not present

## 2017-01-31 DIAGNOSIS — J3089 Other allergic rhinitis: Secondary | ICD-10-CM | POA: Diagnosis not present

## 2017-02-02 DIAGNOSIS — M1711 Unilateral primary osteoarthritis, right knee: Secondary | ICD-10-CM | POA: Diagnosis not present

## 2017-02-02 DIAGNOSIS — M6281 Muscle weakness (generalized): Secondary | ICD-10-CM | POA: Diagnosis not present

## 2017-02-02 DIAGNOSIS — J301 Allergic rhinitis due to pollen: Secondary | ICD-10-CM | POA: Diagnosis not present

## 2017-02-02 DIAGNOSIS — M25661 Stiffness of right knee, not elsewhere classified: Secondary | ICD-10-CM | POA: Diagnosis not present

## 2017-02-02 DIAGNOSIS — J3089 Other allergic rhinitis: Secondary | ICD-10-CM | POA: Diagnosis not present

## 2017-02-02 DIAGNOSIS — R262 Difficulty in walking, not elsewhere classified: Secondary | ICD-10-CM | POA: Diagnosis not present

## 2017-02-07 DIAGNOSIS — M1711 Unilateral primary osteoarthritis, right knee: Secondary | ICD-10-CM | POA: Diagnosis not present

## 2017-02-07 DIAGNOSIS — M25661 Stiffness of right knee, not elsewhere classified: Secondary | ICD-10-CM | POA: Diagnosis not present

## 2017-02-07 DIAGNOSIS — R262 Difficulty in walking, not elsewhere classified: Secondary | ICD-10-CM | POA: Diagnosis not present

## 2017-02-07 DIAGNOSIS — M6281 Muscle weakness (generalized): Secondary | ICD-10-CM | POA: Diagnosis not present

## 2017-02-09 DIAGNOSIS — M25661 Stiffness of right knee, not elsewhere classified: Secondary | ICD-10-CM | POA: Diagnosis not present

## 2017-02-09 DIAGNOSIS — M6281 Muscle weakness (generalized): Secondary | ICD-10-CM | POA: Diagnosis not present

## 2017-02-09 DIAGNOSIS — R262 Difficulty in walking, not elsewhere classified: Secondary | ICD-10-CM | POA: Diagnosis not present

## 2017-02-09 DIAGNOSIS — M1711 Unilateral primary osteoarthritis, right knee: Secondary | ICD-10-CM | POA: Diagnosis not present

## 2017-02-14 DIAGNOSIS — M25661 Stiffness of right knee, not elsewhere classified: Secondary | ICD-10-CM | POA: Diagnosis not present

## 2017-02-14 DIAGNOSIS — M6281 Muscle weakness (generalized): Secondary | ICD-10-CM | POA: Diagnosis not present

## 2017-02-14 DIAGNOSIS — M1711 Unilateral primary osteoarthritis, right knee: Secondary | ICD-10-CM | POA: Diagnosis not present

## 2017-02-14 DIAGNOSIS — R262 Difficulty in walking, not elsewhere classified: Secondary | ICD-10-CM | POA: Diagnosis not present

## 2017-02-16 DIAGNOSIS — M1711 Unilateral primary osteoarthritis, right knee: Secondary | ICD-10-CM | POA: Diagnosis not present

## 2017-02-16 DIAGNOSIS — M6281 Muscle weakness (generalized): Secondary | ICD-10-CM | POA: Diagnosis not present

## 2017-02-16 DIAGNOSIS — M25661 Stiffness of right knee, not elsewhere classified: Secondary | ICD-10-CM | POA: Diagnosis not present

## 2017-02-16 DIAGNOSIS — R262 Difficulty in walking, not elsewhere classified: Secondary | ICD-10-CM | POA: Diagnosis not present

## 2017-02-21 DIAGNOSIS — R262 Difficulty in walking, not elsewhere classified: Secondary | ICD-10-CM | POA: Diagnosis not present

## 2017-02-21 DIAGNOSIS — M25661 Stiffness of right knee, not elsewhere classified: Secondary | ICD-10-CM | POA: Diagnosis not present

## 2017-02-21 DIAGNOSIS — M1711 Unilateral primary osteoarthritis, right knee: Secondary | ICD-10-CM | POA: Diagnosis not present

## 2017-02-21 DIAGNOSIS — M6281 Muscle weakness (generalized): Secondary | ICD-10-CM | POA: Diagnosis not present

## 2017-02-23 DIAGNOSIS — M1711 Unilateral primary osteoarthritis, right knee: Secondary | ICD-10-CM | POA: Diagnosis not present

## 2017-02-23 DIAGNOSIS — M6281 Muscle weakness (generalized): Secondary | ICD-10-CM | POA: Diagnosis not present

## 2017-02-23 DIAGNOSIS — M25661 Stiffness of right knee, not elsewhere classified: Secondary | ICD-10-CM | POA: Diagnosis not present

## 2017-02-23 DIAGNOSIS — R262 Difficulty in walking, not elsewhere classified: Secondary | ICD-10-CM | POA: Diagnosis not present

## 2017-02-28 DIAGNOSIS — R262 Difficulty in walking, not elsewhere classified: Secondary | ICD-10-CM | POA: Diagnosis not present

## 2017-02-28 DIAGNOSIS — M6281 Muscle weakness (generalized): Secondary | ICD-10-CM | POA: Diagnosis not present

## 2017-02-28 DIAGNOSIS — M1711 Unilateral primary osteoarthritis, right knee: Secondary | ICD-10-CM | POA: Diagnosis not present

## 2017-02-28 DIAGNOSIS — M25661 Stiffness of right knee, not elsewhere classified: Secondary | ICD-10-CM | POA: Diagnosis not present

## 2017-03-02 DIAGNOSIS — M1711 Unilateral primary osteoarthritis, right knee: Secondary | ICD-10-CM | POA: Diagnosis not present

## 2017-03-02 DIAGNOSIS — R262 Difficulty in walking, not elsewhere classified: Secondary | ICD-10-CM | POA: Diagnosis not present

## 2017-03-02 DIAGNOSIS — M6281 Muscle weakness (generalized): Secondary | ICD-10-CM | POA: Diagnosis not present

## 2017-03-02 DIAGNOSIS — M25661 Stiffness of right knee, not elsewhere classified: Secondary | ICD-10-CM | POA: Diagnosis not present

## 2017-03-07 DIAGNOSIS — R262 Difficulty in walking, not elsewhere classified: Secondary | ICD-10-CM | POA: Diagnosis not present

## 2017-03-07 DIAGNOSIS — M1711 Unilateral primary osteoarthritis, right knee: Secondary | ICD-10-CM | POA: Diagnosis not present

## 2017-03-07 DIAGNOSIS — M6281 Muscle weakness (generalized): Secondary | ICD-10-CM | POA: Diagnosis not present

## 2017-03-07 DIAGNOSIS — M25661 Stiffness of right knee, not elsewhere classified: Secondary | ICD-10-CM | POA: Diagnosis not present

## 2017-03-09 DIAGNOSIS — M25661 Stiffness of right knee, not elsewhere classified: Secondary | ICD-10-CM | POA: Diagnosis not present

## 2017-03-09 DIAGNOSIS — L570 Actinic keratosis: Secondary | ICD-10-CM | POA: Diagnosis not present

## 2017-03-09 DIAGNOSIS — J301 Allergic rhinitis due to pollen: Secondary | ICD-10-CM | POA: Diagnosis not present

## 2017-03-09 DIAGNOSIS — M6281 Muscle weakness (generalized): Secondary | ICD-10-CM | POA: Diagnosis not present

## 2017-03-09 DIAGNOSIS — Z85828 Personal history of other malignant neoplasm of skin: Secondary | ICD-10-CM | POA: Diagnosis not present

## 2017-03-09 DIAGNOSIS — D225 Melanocytic nevi of trunk: Secondary | ICD-10-CM | POA: Diagnosis not present

## 2017-03-09 DIAGNOSIS — Z86018 Personal history of other benign neoplasm: Secondary | ICD-10-CM | POA: Diagnosis not present

## 2017-03-09 DIAGNOSIS — Z23 Encounter for immunization: Secondary | ICD-10-CM | POA: Diagnosis not present

## 2017-03-09 DIAGNOSIS — M1711 Unilateral primary osteoarthritis, right knee: Secondary | ICD-10-CM | POA: Diagnosis not present

## 2017-03-09 DIAGNOSIS — R262 Difficulty in walking, not elsewhere classified: Secondary | ICD-10-CM | POA: Diagnosis not present

## 2017-03-09 DIAGNOSIS — L821 Other seborrheic keratosis: Secondary | ICD-10-CM | POA: Diagnosis not present

## 2017-03-09 DIAGNOSIS — D485 Neoplasm of uncertain behavior of skin: Secondary | ICD-10-CM | POA: Diagnosis not present

## 2017-03-09 DIAGNOSIS — J3089 Other allergic rhinitis: Secondary | ICD-10-CM | POA: Diagnosis not present

## 2017-03-09 DIAGNOSIS — D2271 Melanocytic nevi of right lower limb, including hip: Secondary | ICD-10-CM | POA: Diagnosis not present

## 2017-03-09 DIAGNOSIS — C44519 Basal cell carcinoma of skin of other part of trunk: Secondary | ICD-10-CM | POA: Diagnosis not present

## 2017-03-09 DIAGNOSIS — L700 Acne vulgaris: Secondary | ICD-10-CM | POA: Diagnosis not present

## 2017-03-13 DIAGNOSIS — J301 Allergic rhinitis due to pollen: Secondary | ICD-10-CM | POA: Diagnosis not present

## 2017-03-13 DIAGNOSIS — J3089 Other allergic rhinitis: Secondary | ICD-10-CM | POA: Diagnosis not present

## 2017-03-14 DIAGNOSIS — M1711 Unilateral primary osteoarthritis, right knee: Secondary | ICD-10-CM | POA: Diagnosis not present

## 2017-03-14 DIAGNOSIS — R262 Difficulty in walking, not elsewhere classified: Secondary | ICD-10-CM | POA: Diagnosis not present

## 2017-03-14 DIAGNOSIS — M25661 Stiffness of right knee, not elsewhere classified: Secondary | ICD-10-CM | POA: Diagnosis not present

## 2017-03-14 DIAGNOSIS — M6281 Muscle weakness (generalized): Secondary | ICD-10-CM | POA: Diagnosis not present

## 2017-03-15 DIAGNOSIS — J301 Allergic rhinitis due to pollen: Secondary | ICD-10-CM | POA: Diagnosis not present

## 2017-03-15 DIAGNOSIS — J3089 Other allergic rhinitis: Secondary | ICD-10-CM | POA: Diagnosis not present

## 2017-03-20 DIAGNOSIS — H16223 Keratoconjunctivitis sicca, not specified as Sjogren's, bilateral: Secondary | ICD-10-CM | POA: Diagnosis not present

## 2017-03-20 DIAGNOSIS — I10 Essential (primary) hypertension: Secondary | ICD-10-CM | POA: Diagnosis not present

## 2017-03-20 DIAGNOSIS — Z961 Presence of intraocular lens: Secondary | ICD-10-CM | POA: Diagnosis not present

## 2017-03-20 DIAGNOSIS — J3089 Other allergic rhinitis: Secondary | ICD-10-CM | POA: Diagnosis not present

## 2017-03-20 DIAGNOSIS — J301 Allergic rhinitis due to pollen: Secondary | ICD-10-CM | POA: Diagnosis not present

## 2017-03-20 DIAGNOSIS — H34231 Retinal artery branch occlusion, right eye: Secondary | ICD-10-CM | POA: Diagnosis not present

## 2017-03-21 DIAGNOSIS — M25661 Stiffness of right knee, not elsewhere classified: Secondary | ICD-10-CM | POA: Diagnosis not present

## 2017-03-21 DIAGNOSIS — M6281 Muscle weakness (generalized): Secondary | ICD-10-CM | POA: Diagnosis not present

## 2017-03-21 DIAGNOSIS — R262 Difficulty in walking, not elsewhere classified: Secondary | ICD-10-CM | POA: Diagnosis not present

## 2017-03-21 DIAGNOSIS — M1711 Unilateral primary osteoarthritis, right knee: Secondary | ICD-10-CM | POA: Diagnosis not present

## 2017-03-29 DIAGNOSIS — M1711 Unilateral primary osteoarthritis, right knee: Secondary | ICD-10-CM | POA: Diagnosis not present

## 2017-03-29 DIAGNOSIS — R262 Difficulty in walking, not elsewhere classified: Secondary | ICD-10-CM | POA: Diagnosis not present

## 2017-03-29 DIAGNOSIS — M6281 Muscle weakness (generalized): Secondary | ICD-10-CM | POA: Diagnosis not present

## 2017-03-29 DIAGNOSIS — M25661 Stiffness of right knee, not elsewhere classified: Secondary | ICD-10-CM | POA: Diagnosis not present

## 2017-03-31 DIAGNOSIS — M1711 Unilateral primary osteoarthritis, right knee: Secondary | ICD-10-CM | POA: Diagnosis not present

## 2017-03-31 DIAGNOSIS — M6281 Muscle weakness (generalized): Secondary | ICD-10-CM | POA: Diagnosis not present

## 2017-03-31 DIAGNOSIS — R262 Difficulty in walking, not elsewhere classified: Secondary | ICD-10-CM | POA: Diagnosis not present

## 2017-03-31 DIAGNOSIS — M25661 Stiffness of right knee, not elsewhere classified: Secondary | ICD-10-CM | POA: Diagnosis not present

## 2017-04-05 DIAGNOSIS — M1711 Unilateral primary osteoarthritis, right knee: Secondary | ICD-10-CM | POA: Diagnosis not present

## 2017-04-05 DIAGNOSIS — R262 Difficulty in walking, not elsewhere classified: Secondary | ICD-10-CM | POA: Diagnosis not present

## 2017-04-05 DIAGNOSIS — M25661 Stiffness of right knee, not elsewhere classified: Secondary | ICD-10-CM | POA: Diagnosis not present

## 2017-04-05 DIAGNOSIS — M6281 Muscle weakness (generalized): Secondary | ICD-10-CM | POA: Diagnosis not present

## 2017-04-06 DIAGNOSIS — C44519 Basal cell carcinoma of skin of other part of trunk: Secondary | ICD-10-CM | POA: Diagnosis not present

## 2017-04-07 DIAGNOSIS — M6281 Muscle weakness (generalized): Secondary | ICD-10-CM | POA: Diagnosis not present

## 2017-04-07 DIAGNOSIS — M1711 Unilateral primary osteoarthritis, right knee: Secondary | ICD-10-CM | POA: Diagnosis not present

## 2017-04-07 DIAGNOSIS — M25661 Stiffness of right knee, not elsewhere classified: Secondary | ICD-10-CM | POA: Diagnosis not present

## 2017-04-07 DIAGNOSIS — R262 Difficulty in walking, not elsewhere classified: Secondary | ICD-10-CM | POA: Diagnosis not present

## 2017-04-11 DIAGNOSIS — R262 Difficulty in walking, not elsewhere classified: Secondary | ICD-10-CM | POA: Diagnosis not present

## 2017-04-11 DIAGNOSIS — M25661 Stiffness of right knee, not elsewhere classified: Secondary | ICD-10-CM | POA: Diagnosis not present

## 2017-04-11 DIAGNOSIS — M6281 Muscle weakness (generalized): Secondary | ICD-10-CM | POA: Diagnosis not present

## 2017-04-11 DIAGNOSIS — M1711 Unilateral primary osteoarthritis, right knee: Secondary | ICD-10-CM | POA: Diagnosis not present

## 2017-04-13 DIAGNOSIS — R262 Difficulty in walking, not elsewhere classified: Secondary | ICD-10-CM | POA: Diagnosis not present

## 2017-04-13 DIAGNOSIS — M1711 Unilateral primary osteoarthritis, right knee: Secondary | ICD-10-CM | POA: Diagnosis not present

## 2017-04-13 DIAGNOSIS — M25661 Stiffness of right knee, not elsewhere classified: Secondary | ICD-10-CM | POA: Diagnosis not present

## 2017-04-13 DIAGNOSIS — M6281 Muscle weakness (generalized): Secondary | ICD-10-CM | POA: Diagnosis not present

## 2017-04-20 DIAGNOSIS — R262 Difficulty in walking, not elsewhere classified: Secondary | ICD-10-CM | POA: Diagnosis not present

## 2017-04-20 DIAGNOSIS — M6281 Muscle weakness (generalized): Secondary | ICD-10-CM | POA: Diagnosis not present

## 2017-04-20 DIAGNOSIS — M1711 Unilateral primary osteoarthritis, right knee: Secondary | ICD-10-CM | POA: Diagnosis not present

## 2017-04-20 DIAGNOSIS — M25661 Stiffness of right knee, not elsewhere classified: Secondary | ICD-10-CM | POA: Diagnosis not present

## 2017-04-20 DIAGNOSIS — J301 Allergic rhinitis due to pollen: Secondary | ICD-10-CM | POA: Diagnosis not present

## 2017-04-20 DIAGNOSIS — J3089 Other allergic rhinitis: Secondary | ICD-10-CM | POA: Diagnosis not present

## 2017-05-03 DIAGNOSIS — G250 Essential tremor: Secondary | ICD-10-CM | POA: Diagnosis not present

## 2017-05-03 DIAGNOSIS — Z1389 Encounter for screening for other disorder: Secondary | ICD-10-CM | POA: Diagnosis not present

## 2017-05-03 DIAGNOSIS — Z6828 Body mass index (BMI) 28.0-28.9, adult: Secondary | ICD-10-CM | POA: Diagnosis not present

## 2017-05-03 DIAGNOSIS — Z86010 Personal history of colonic polyps: Secondary | ICD-10-CM | POA: Diagnosis not present

## 2017-05-03 DIAGNOSIS — R197 Diarrhea, unspecified: Secondary | ICD-10-CM | POA: Diagnosis not present

## 2017-05-03 DIAGNOSIS — I10 Essential (primary) hypertension: Secondary | ICD-10-CM | POA: Diagnosis not present

## 2017-05-03 DIAGNOSIS — N400 Enlarged prostate without lower urinary tract symptoms: Secondary | ICD-10-CM | POA: Diagnosis not present

## 2017-05-03 DIAGNOSIS — M1000 Idiopathic gout, unspecified site: Secondary | ICD-10-CM | POA: Diagnosis not present

## 2017-05-04 DIAGNOSIS — R351 Nocturia: Secondary | ICD-10-CM | POA: Diagnosis not present

## 2017-05-04 DIAGNOSIS — N200 Calculus of kidney: Secondary | ICD-10-CM | POA: Diagnosis not present

## 2017-05-04 DIAGNOSIS — R197 Diarrhea, unspecified: Secondary | ICD-10-CM | POA: Diagnosis not present

## 2017-05-04 DIAGNOSIS — N401 Enlarged prostate with lower urinary tract symptoms: Secondary | ICD-10-CM | POA: Diagnosis not present

## 2017-05-09 DIAGNOSIS — M25661 Stiffness of right knee, not elsewhere classified: Secondary | ICD-10-CM | POA: Diagnosis not present

## 2017-05-09 DIAGNOSIS — M6281 Muscle weakness (generalized): Secondary | ICD-10-CM | POA: Diagnosis not present

## 2017-05-09 DIAGNOSIS — M1711 Unilateral primary osteoarthritis, right knee: Secondary | ICD-10-CM | POA: Diagnosis not present

## 2017-05-09 DIAGNOSIS — R262 Difficulty in walking, not elsewhere classified: Secondary | ICD-10-CM | POA: Diagnosis not present

## 2017-05-11 DIAGNOSIS — M25661 Stiffness of right knee, not elsewhere classified: Secondary | ICD-10-CM | POA: Diagnosis not present

## 2017-05-11 DIAGNOSIS — M1711 Unilateral primary osteoarthritis, right knee: Secondary | ICD-10-CM | POA: Diagnosis not present

## 2017-05-11 DIAGNOSIS — R262 Difficulty in walking, not elsewhere classified: Secondary | ICD-10-CM | POA: Diagnosis not present

## 2017-05-11 DIAGNOSIS — M6281 Muscle weakness (generalized): Secondary | ICD-10-CM | POA: Diagnosis not present

## 2017-05-15 DIAGNOSIS — K921 Melena: Secondary | ICD-10-CM | POA: Diagnosis not present

## 2017-05-15 DIAGNOSIS — R262 Difficulty in walking, not elsewhere classified: Secondary | ICD-10-CM | POA: Diagnosis not present

## 2017-05-15 DIAGNOSIS — M25661 Stiffness of right knee, not elsewhere classified: Secondary | ICD-10-CM | POA: Diagnosis not present

## 2017-05-15 DIAGNOSIS — M1711 Unilateral primary osteoarthritis, right knee: Secondary | ICD-10-CM | POA: Diagnosis not present

## 2017-05-15 DIAGNOSIS — M6281 Muscle weakness (generalized): Secondary | ICD-10-CM | POA: Diagnosis not present

## 2017-05-15 DIAGNOSIS — R197 Diarrhea, unspecified: Secondary | ICD-10-CM | POA: Diagnosis not present

## 2017-05-16 DIAGNOSIS — R197 Diarrhea, unspecified: Secondary | ICD-10-CM | POA: Diagnosis not present

## 2017-05-17 DIAGNOSIS — M6281 Muscle weakness (generalized): Secondary | ICD-10-CM | POA: Diagnosis not present

## 2017-05-17 DIAGNOSIS — R262 Difficulty in walking, not elsewhere classified: Secondary | ICD-10-CM | POA: Diagnosis not present

## 2017-05-17 DIAGNOSIS — M25661 Stiffness of right knee, not elsewhere classified: Secondary | ICD-10-CM | POA: Diagnosis not present

## 2017-05-17 DIAGNOSIS — K921 Melena: Secondary | ICD-10-CM | POA: Diagnosis not present

## 2017-05-17 DIAGNOSIS — M1711 Unilateral primary osteoarthritis, right knee: Secondary | ICD-10-CM | POA: Diagnosis not present

## 2017-05-18 DIAGNOSIS — K2900 Acute gastritis without bleeding: Secondary | ICD-10-CM | POA: Diagnosis not present

## 2017-05-18 DIAGNOSIS — K449 Diaphragmatic hernia without obstruction or gangrene: Secondary | ICD-10-CM | POA: Diagnosis not present

## 2017-05-18 DIAGNOSIS — J301 Allergic rhinitis due to pollen: Secondary | ICD-10-CM | POA: Diagnosis not present

## 2017-05-18 DIAGNOSIS — J3089 Other allergic rhinitis: Secondary | ICD-10-CM | POA: Diagnosis not present

## 2017-05-18 DIAGNOSIS — K210 Gastro-esophageal reflux disease with esophagitis: Secondary | ICD-10-CM | POA: Diagnosis not present

## 2017-05-18 DIAGNOSIS — K921 Melena: Secondary | ICD-10-CM | POA: Diagnosis not present

## 2017-05-23 DIAGNOSIS — M1711 Unilateral primary osteoarthritis, right knee: Secondary | ICD-10-CM | POA: Diagnosis not present

## 2017-05-23 DIAGNOSIS — M25661 Stiffness of right knee, not elsewhere classified: Secondary | ICD-10-CM | POA: Diagnosis not present

## 2017-05-23 DIAGNOSIS — M6281 Muscle weakness (generalized): Secondary | ICD-10-CM | POA: Diagnosis not present

## 2017-05-23 DIAGNOSIS — R262 Difficulty in walking, not elsewhere classified: Secondary | ICD-10-CM | POA: Diagnosis not present

## 2017-05-25 DIAGNOSIS — M6281 Muscle weakness (generalized): Secondary | ICD-10-CM | POA: Diagnosis not present

## 2017-05-25 DIAGNOSIS — R262 Difficulty in walking, not elsewhere classified: Secondary | ICD-10-CM | POA: Diagnosis not present

## 2017-05-25 DIAGNOSIS — M25661 Stiffness of right knee, not elsewhere classified: Secondary | ICD-10-CM | POA: Diagnosis not present

## 2017-05-25 DIAGNOSIS — M1711 Unilateral primary osteoarthritis, right knee: Secondary | ICD-10-CM | POA: Diagnosis not present

## 2017-05-30 DIAGNOSIS — R262 Difficulty in walking, not elsewhere classified: Secondary | ICD-10-CM | POA: Diagnosis not present

## 2017-05-30 DIAGNOSIS — M1711 Unilateral primary osteoarthritis, right knee: Secondary | ICD-10-CM | POA: Diagnosis not present

## 2017-05-30 DIAGNOSIS — M6281 Muscle weakness (generalized): Secondary | ICD-10-CM | POA: Diagnosis not present

## 2017-05-30 DIAGNOSIS — M25661 Stiffness of right knee, not elsewhere classified: Secondary | ICD-10-CM | POA: Diagnosis not present

## 2017-06-01 DIAGNOSIS — M6281 Muscle weakness (generalized): Secondary | ICD-10-CM | POA: Diagnosis not present

## 2017-06-01 DIAGNOSIS — M25661 Stiffness of right knee, not elsewhere classified: Secondary | ICD-10-CM | POA: Diagnosis not present

## 2017-06-01 DIAGNOSIS — R262 Difficulty in walking, not elsewhere classified: Secondary | ICD-10-CM | POA: Diagnosis not present

## 2017-06-01 DIAGNOSIS — M1711 Unilateral primary osteoarthritis, right knee: Secondary | ICD-10-CM | POA: Diagnosis not present

## 2017-06-06 DIAGNOSIS — R262 Difficulty in walking, not elsewhere classified: Secondary | ICD-10-CM | POA: Diagnosis not present

## 2017-06-06 DIAGNOSIS — M6281 Muscle weakness (generalized): Secondary | ICD-10-CM | POA: Diagnosis not present

## 2017-06-06 DIAGNOSIS — M25661 Stiffness of right knee, not elsewhere classified: Secondary | ICD-10-CM | POA: Diagnosis not present

## 2017-06-06 DIAGNOSIS — M1711 Unilateral primary osteoarthritis, right knee: Secondary | ICD-10-CM | POA: Diagnosis not present

## 2017-06-08 DIAGNOSIS — M6281 Muscle weakness (generalized): Secondary | ICD-10-CM | POA: Diagnosis not present

## 2017-06-08 DIAGNOSIS — J301 Allergic rhinitis due to pollen: Secondary | ICD-10-CM | POA: Diagnosis not present

## 2017-06-08 DIAGNOSIS — R262 Difficulty in walking, not elsewhere classified: Secondary | ICD-10-CM | POA: Diagnosis not present

## 2017-06-08 DIAGNOSIS — M1711 Unilateral primary osteoarthritis, right knee: Secondary | ICD-10-CM | POA: Diagnosis not present

## 2017-06-08 DIAGNOSIS — M25661 Stiffness of right knee, not elsewhere classified: Secondary | ICD-10-CM | POA: Diagnosis not present

## 2017-06-08 DIAGNOSIS — J3089 Other allergic rhinitis: Secondary | ICD-10-CM | POA: Diagnosis not present

## 2017-06-13 DIAGNOSIS — M25661 Stiffness of right knee, not elsewhere classified: Secondary | ICD-10-CM | POA: Diagnosis not present

## 2017-06-13 DIAGNOSIS — M6281 Muscle weakness (generalized): Secondary | ICD-10-CM | POA: Diagnosis not present

## 2017-06-13 DIAGNOSIS — M1711 Unilateral primary osteoarthritis, right knee: Secondary | ICD-10-CM | POA: Diagnosis not present

## 2017-06-13 DIAGNOSIS — R262 Difficulty in walking, not elsewhere classified: Secondary | ICD-10-CM | POA: Diagnosis not present

## 2017-06-15 DIAGNOSIS — R194 Change in bowel habit: Secondary | ICD-10-CM | POA: Diagnosis not present

## 2017-06-15 DIAGNOSIS — R197 Diarrhea, unspecified: Secondary | ICD-10-CM | POA: Diagnosis not present

## 2017-06-19 DIAGNOSIS — M6281 Muscle weakness (generalized): Secondary | ICD-10-CM | POA: Diagnosis not present

## 2017-06-19 DIAGNOSIS — M25661 Stiffness of right knee, not elsewhere classified: Secondary | ICD-10-CM | POA: Diagnosis not present

## 2017-06-19 DIAGNOSIS — R262 Difficulty in walking, not elsewhere classified: Secondary | ICD-10-CM | POA: Diagnosis not present

## 2017-06-19 DIAGNOSIS — M1711 Unilateral primary osteoarthritis, right knee: Secondary | ICD-10-CM | POA: Diagnosis not present

## 2017-06-23 DIAGNOSIS — M6281 Muscle weakness (generalized): Secondary | ICD-10-CM | POA: Diagnosis not present

## 2017-06-23 DIAGNOSIS — M1711 Unilateral primary osteoarthritis, right knee: Secondary | ICD-10-CM | POA: Diagnosis not present

## 2017-06-23 DIAGNOSIS — R262 Difficulty in walking, not elsewhere classified: Secondary | ICD-10-CM | POA: Diagnosis not present

## 2017-06-23 DIAGNOSIS — M25661 Stiffness of right knee, not elsewhere classified: Secondary | ICD-10-CM | POA: Diagnosis not present

## 2017-07-06 DIAGNOSIS — J301 Allergic rhinitis due to pollen: Secondary | ICD-10-CM | POA: Diagnosis not present

## 2017-07-06 DIAGNOSIS — J3089 Other allergic rhinitis: Secondary | ICD-10-CM | POA: Diagnosis not present

## 2017-07-10 DIAGNOSIS — J301 Allergic rhinitis due to pollen: Secondary | ICD-10-CM | POA: Diagnosis not present

## 2017-07-10 DIAGNOSIS — J3081 Allergic rhinitis due to animal (cat) (dog) hair and dander: Secondary | ICD-10-CM | POA: Diagnosis not present

## 2017-07-10 DIAGNOSIS — H1045 Other chronic allergic conjunctivitis: Secondary | ICD-10-CM | POA: Diagnosis not present

## 2017-07-10 DIAGNOSIS — J3089 Other allergic rhinitis: Secondary | ICD-10-CM | POA: Diagnosis not present

## 2017-08-04 DIAGNOSIS — J3089 Other allergic rhinitis: Secondary | ICD-10-CM | POA: Diagnosis not present

## 2017-08-04 DIAGNOSIS — J301 Allergic rhinitis due to pollen: Secondary | ICD-10-CM | POA: Diagnosis not present

## 2017-08-29 DIAGNOSIS — J301 Allergic rhinitis due to pollen: Secondary | ICD-10-CM | POA: Diagnosis not present

## 2017-08-29 DIAGNOSIS — J3089 Other allergic rhinitis: Secondary | ICD-10-CM | POA: Diagnosis not present

## 2017-08-31 DIAGNOSIS — R194 Change in bowel habit: Secondary | ICD-10-CM | POA: Diagnosis not present

## 2017-08-31 DIAGNOSIS — R197 Diarrhea, unspecified: Secondary | ICD-10-CM | POA: Diagnosis not present

## 2017-09-07 DIAGNOSIS — R82998 Other abnormal findings in urine: Secondary | ICD-10-CM | POA: Diagnosis not present

## 2017-09-07 DIAGNOSIS — Z125 Encounter for screening for malignant neoplasm of prostate: Secondary | ICD-10-CM | POA: Diagnosis not present

## 2017-09-07 DIAGNOSIS — I10 Essential (primary) hypertension: Secondary | ICD-10-CM | POA: Diagnosis not present

## 2017-09-14 DIAGNOSIS — Z86010 Personal history of colonic polyps: Secondary | ICD-10-CM | POA: Diagnosis not present

## 2017-09-14 DIAGNOSIS — I10 Essential (primary) hypertension: Secondary | ICD-10-CM | POA: Diagnosis not present

## 2017-09-14 DIAGNOSIS — Z1389 Encounter for screening for other disorder: Secondary | ICD-10-CM | POA: Diagnosis not present

## 2017-09-14 DIAGNOSIS — I700 Atherosclerosis of aorta: Secondary | ICD-10-CM | POA: Diagnosis not present

## 2017-09-14 DIAGNOSIS — Z0000 Encounter for general adult medical examination without abnormal findings: Secondary | ICD-10-CM | POA: Diagnosis not present

## 2017-09-14 DIAGNOSIS — R7301 Impaired fasting glucose: Secondary | ICD-10-CM | POA: Diagnosis not present

## 2017-09-14 DIAGNOSIS — N400 Enlarged prostate without lower urinary tract symptoms: Secondary | ICD-10-CM | POA: Diagnosis not present

## 2017-09-14 DIAGNOSIS — Z6828 Body mass index (BMI) 28.0-28.9, adult: Secondary | ICD-10-CM | POA: Diagnosis not present

## 2017-09-14 DIAGNOSIS — G4700 Insomnia, unspecified: Secondary | ICD-10-CM | POA: Diagnosis not present

## 2017-09-14 DIAGNOSIS — G250 Essential tremor: Secondary | ICD-10-CM | POA: Diagnosis not present

## 2017-09-15 DIAGNOSIS — Z1212 Encounter for screening for malignant neoplasm of rectum: Secondary | ICD-10-CM | POA: Diagnosis not present

## 2017-09-15 DIAGNOSIS — J3089 Other allergic rhinitis: Secondary | ICD-10-CM | POA: Diagnosis not present

## 2017-09-15 DIAGNOSIS — J301 Allergic rhinitis due to pollen: Secondary | ICD-10-CM | POA: Diagnosis not present

## 2017-10-11 DIAGNOSIS — H9113 Presbycusis, bilateral: Secondary | ICD-10-CM | POA: Diagnosis not present

## 2017-10-11 DIAGNOSIS — H903 Sensorineural hearing loss, bilateral: Secondary | ICD-10-CM | POA: Diagnosis not present

## 2017-10-16 DIAGNOSIS — J3089 Other allergic rhinitis: Secondary | ICD-10-CM | POA: Diagnosis not present

## 2017-10-16 DIAGNOSIS — J301 Allergic rhinitis due to pollen: Secondary | ICD-10-CM | POA: Diagnosis not present

## 2017-11-10 DIAGNOSIS — J3089 Other allergic rhinitis: Secondary | ICD-10-CM | POA: Diagnosis not present

## 2017-11-10 DIAGNOSIS — J301 Allergic rhinitis due to pollen: Secondary | ICD-10-CM | POA: Diagnosis not present

## 2017-11-15 NOTE — Progress Notes (Signed)
Cardiology Office Note   Date:  11/17/2017   ID:  Ross Level., DOB Feb 25, 1941, MRN [id number]  PCP:  Ross Redwood, MD  Cardiologist:   Ross Breeding, MD   Chief Complaint  Patient presents with  . Bruit      History of Present Illness: Ross [name]. is a 77 y.o. male who presents for evaluation of aortic calcification. He's being evaluated for kidney stone and had a CT. He had some calcium in his aorta down into his iliacs.  Echo in 2012 demonstrated normal left ventricular function with some mild aortic valve calcification. He's had minimal plaque on carotid Dopplers. Stress perfusion study in 2008 demonstrated no evidence of ischemia. POET (Plain Old Exercise Treadmill) in 2016 had some nonspecific ST changes but no clear evidence of ischemia.  He returns for follow up.    Since I last saw him he had TKR last year.    Since I last saw him he has done well.  The patient denies any new symptoms such as chest discomfort, neck or arm discomfort. There has been no new shortness of breath, PND or orthopnea. There have been no reported palpitations, presyncope or syncope.   He walks and he golfs .     Past Medical History:  Diagnosis Date  . 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 Essential and other specified forms of tremor 08/30/2013  . Hypertension   . Kidney stones   . Stroke [facility]) 2005    Past Surgical History:  Procedure Laterality Date  . APPENDECTOMY    . CATARACT EXTRACTION     bilateral  . CHOLECYSTECTOMY    . CYSTOSCOPY WITH RETROGRADE PYELOGRAM, URETEROSCOPY AND STENT PLACEMENT Left 06/11/2013   Procedure: CYSTOSCOPY WITH RETROGRADE Left PYELOGRAM, URETEROSCOPY AND Left STENT PLACEMENT;  Surgeon: Ross Rud, MD;  Location: [facility];  Service: Urology;  Laterality: Left;  . KNEE SURGERY     bilateral arthroscopic  . SHOULDER SURGERY     right   . TOTAL KNEE ARTHROPLASTY Right 12/06/2016   Procedure: RIGHT TOTAL KNEE ARTHROPLASTY;  Surgeon: Ross Cancel, MD;  Location: [facility];  Service: Orthopedics;  Laterality: Right;  70 mins     Current Outpatient Medications  Medication Sig Dispense Refill  . allopurinol (ZYLOPRIM) 100 MG tablet Take 100 mg by mouth at bedtime.     Marland Kitchen aspirin 81 MG chewable tablet Chew 1 tablet (81 mg total) by mouth 2 (two) times daily. (Patient taking differently: Chew 81 mg by mouth daily. ) 60 tablet 0  . b complex vitamins tablet Take 1 tablet by mouth daily.     . cetirizine (ZYRTEC) 10 MG tablet Take 10 mg by mouth as needed for allergies.     . Cholecalciferol (VITAMIN D) 2000 units CAPS Take 2,000 Units by mouth daily.    Marland Kitchen EPINEPHrine 0.3 mg/0.3 mL IJ SOAJ injection Inject 0.3 mg into the muscle daily as needed (allergic reaction).    . fluticasone (FLONASE) 50 MCG/ACT nasal spray Place 1 spray into both nostrils daily.    . Hypromellose (ARTIFICIAL TEARS OP) Apply 1 drop to eye daily.    Marland Kitchen ketoconazole (NIZORAL) 2 % cream Apply 1 application topically daily as needed for irritation.    Marland Kitchen lisinopril-hydrochlorothiazide (PRINZIDE,ZESTORETIC) 10-12.5 MG per tablet Take 1 tablet by mouth at bedtime.     . Multiple Vitamin (MULTIVITAMIN) tablet Take 1 tablet by mouth daily. With lunch and  at night.    . Potassium Citrate 15 MEQ (1620 MG) TBCR Take 2 tablets by mouth 2 (two) times daily.  6  . PRESCRIPTION MEDICATION 1 each by Subconjunctival route every 30 (thirty) days. Allergy shot    . sodium chloride (MURO 128) 5 % ophthalmic solution Place 1 drop into both eyes 2 (two) times daily.    Marland Kitchen MYRBETRIQ 50 MG TB24 tablet Take 50 mg by mouth at bedtime.      No current facility-administered medications for this visit.     Allergies:   Dexlansoprazole    ROS:  Please see the history of present illness.   Otherwise, review of systems are positive for none.   All other systems are reviewed and negative.    PHYSICAL EXAM: VS:  BP 134/62   Pulse 67   Ht 5\' 8"  (1.727 m)   Wt 186 lb (84.4 kg)   BMI 28.28  kg/m  , BMI Body mass index is 28.28 kg/m.  GENERAL:  Well appearing NECK:  No jugular venous distention, waveform within normal limits, carotid upstroke brisk and symmetric, no bruits, no thyromegaly LUNGS:  Clear to auscultation bilaterally CHEST:  Unremarkable HEART:  PMI not displaced or sustained,S1 and S2 within normal limits, no S3, no S4, no clicks, no rubs, no murmurs ABD:  Flat, positive bowel sounds normal in frequency in pitch, no bruits, no rebound, no guarding, no midline pulsatile mass, no hepatomegaly, no splenomegaly EXT:  2 plus pulses throughout, no edema, no cyanosis no clubbing    EKG:  EKG is  ordered today. The ekg ordered today demonstrates sinus rhythm, rate 67, axis within normal limits, intervals within normal limits, no acute ST-T wave changes. PVCs.   11/17/2017    Wt Readings from Last 3 Encounters:  11/17/17 186 lb (84.4 kg)  12/06/16 185 lb (83.9 kg)  11/28/16 185 lb (83.9 kg)    No results found for: CHOL, TRIG, HDL, LDLCALC, LDLDIRECT   Other studies Reviewed: Additional studies/ records that were reviewed today include: Labs Review of the above records demonstrates:   ASSESSMENT AND PLAN:   AORTIC ATHEROSCLEROSIS:   He has no symptoms.  He will continue with risk reduction.  No change in therapy.  I might consider stress test when I see him back in a year and a half.  BRUIT:   He had only mild plaque on Doppler in 2017.   No further imaging is indicated.  RISK REDUCTION:     We discussed at length exercise.  We discussed diet.Lipids are managed by Ross Redwood, MD   HTN:  The blood pressure is at target.  No change in therapy.   Current medicines are reviewed at length with the patient today.  The patient does not have concerns regarding medicines.  The following changes have been made:  None  Labs/ tests ordered today include:     Orders Placed This Encounter  Procedures  . EKG 12-Lead     Disposition:   FU with me in 18 months.      Signed, Ross Breeding, MD  11/17/2017 10:07 AM    [facility]

## 2017-11-17 ENCOUNTER — Ambulatory Visit: Payer: PPO | Admitting: Cardiology

## 2017-11-17 ENCOUNTER — Encounter: Payer: Self-pay | Admitting: Cardiology

## 2017-11-17 VITALS — BP 134/62 | HR 67 | Ht 68.0 in | Wt 186.0 lb

## 2017-11-17 DIAGNOSIS — R0989 Other specified symptoms and signs involving the circulatory and respiratory systems: Secondary | ICD-10-CM | POA: Diagnosis not present

## 2017-11-17 DIAGNOSIS — I700 Atherosclerosis of aorta: Secondary | ICD-10-CM | POA: Diagnosis not present

## 2017-11-17 DIAGNOSIS — I10 Essential (primary) hypertension: Secondary | ICD-10-CM

## 2017-11-17 NOTE — Patient Instructions (Addendum)
Medication Instructions:  Continue current medications  If you need a refill on your cardiac medications before your next appointment, please call your pharmacy.  Labwork: None Ordered   Testing/Procedures: None Ordered   Follow-Up: Your physician wants you to follow-up in: 18 Months. You should receive a reminder letter in the mail two months in advance. If you do not receive a letter, please call our office [phone].     Thank you for choosing [facility]!!      

## 2017-11-30 DIAGNOSIS — J3089 Other allergic rhinitis: Secondary | ICD-10-CM | POA: Diagnosis not present

## 2017-11-30 DIAGNOSIS — J301 Allergic rhinitis due to pollen: Secondary | ICD-10-CM | POA: Diagnosis not present

## 2017-12-11 DIAGNOSIS — J019 Acute sinusitis, unspecified: Secondary | ICD-10-CM | POA: Diagnosis not present

## 2017-12-11 DIAGNOSIS — J301 Allergic rhinitis due to pollen: Secondary | ICD-10-CM | POA: Diagnosis not present

## 2017-12-11 DIAGNOSIS — J3089 Other allergic rhinitis: Secondary | ICD-10-CM | POA: Diagnosis not present

## 2017-12-11 DIAGNOSIS — J3081 Allergic rhinitis due to animal (cat) (dog) hair and dander: Secondary | ICD-10-CM | POA: Diagnosis not present

## 2017-12-28 DIAGNOSIS — J3089 Other allergic rhinitis: Secondary | ICD-10-CM | POA: Diagnosis not present

## 2017-12-28 DIAGNOSIS — J301 Allergic rhinitis due to pollen: Secondary | ICD-10-CM | POA: Diagnosis not present

## 2018-01-22 DIAGNOSIS — M25461 Effusion, right knee: Secondary | ICD-10-CM | POA: Diagnosis not present

## 2018-01-25 DIAGNOSIS — J301 Allergic rhinitis due to pollen: Secondary | ICD-10-CM | POA: Diagnosis not present

## 2018-01-25 DIAGNOSIS — J3089 Other allergic rhinitis: Secondary | ICD-10-CM | POA: Diagnosis not present

## 2018-01-26 DIAGNOSIS — D485 Neoplasm of uncertain behavior of skin: Secondary | ICD-10-CM | POA: Diagnosis not present

## 2018-01-26 DIAGNOSIS — D225 Melanocytic nevi of trunk: Secondary | ICD-10-CM | POA: Diagnosis not present

## 2018-01-26 DIAGNOSIS — C44519 Basal cell carcinoma of skin of other part of trunk: Secondary | ICD-10-CM | POA: Diagnosis not present

## 2018-01-26 DIAGNOSIS — Z85828 Personal history of other malignant neoplasm of skin: Secondary | ICD-10-CM | POA: Diagnosis not present

## 2018-01-26 DIAGNOSIS — Z86018 Personal history of other benign neoplasm: Secondary | ICD-10-CM | POA: Diagnosis not present

## 2018-01-26 DIAGNOSIS — L570 Actinic keratosis: Secondary | ICD-10-CM | POA: Diagnosis not present

## 2018-01-26 DIAGNOSIS — C44712 Basal cell carcinoma of skin of right lower limb, including hip: Secondary | ICD-10-CM | POA: Diagnosis not present

## 2018-01-26 DIAGNOSIS — C44319 Basal cell carcinoma of skin of other parts of face: Secondary | ICD-10-CM | POA: Diagnosis not present

## 2018-01-26 DIAGNOSIS — Z23 Encounter for immunization: Secondary | ICD-10-CM | POA: Diagnosis not present

## 2018-01-31 DIAGNOSIS — J301 Allergic rhinitis due to pollen: Secondary | ICD-10-CM | POA: Diagnosis not present

## 2018-01-31 DIAGNOSIS — J3089 Other allergic rhinitis: Secondary | ICD-10-CM | POA: Diagnosis not present

## 2018-02-20 DIAGNOSIS — B999 Unspecified infectious disease: Secondary | ICD-10-CM | POA: Diagnosis not present

## 2018-02-20 DIAGNOSIS — T451X5A Adverse effect of antineoplastic and immunosuppressive drugs, initial encounter: Secondary | ICD-10-CM | POA: Diagnosis not present

## 2018-02-22 DIAGNOSIS — J3089 Other allergic rhinitis: Secondary | ICD-10-CM | POA: Diagnosis not present

## 2018-02-22 DIAGNOSIS — J301 Allergic rhinitis due to pollen: Secondary | ICD-10-CM | POA: Diagnosis not present

## 2018-02-26 DIAGNOSIS — J3089 Other allergic rhinitis: Secondary | ICD-10-CM | POA: Diagnosis not present

## 2018-02-26 DIAGNOSIS — J301 Allergic rhinitis due to pollen: Secondary | ICD-10-CM | POA: Diagnosis not present

## 2018-03-01 DIAGNOSIS — J3089 Other allergic rhinitis: Secondary | ICD-10-CM | POA: Diagnosis not present

## 2018-03-01 DIAGNOSIS — J301 Allergic rhinitis due to pollen: Secondary | ICD-10-CM | POA: Diagnosis not present

## 2018-03-06 DIAGNOSIS — J301 Allergic rhinitis due to pollen: Secondary | ICD-10-CM | POA: Diagnosis not present

## 2018-03-06 DIAGNOSIS — J3089 Other allergic rhinitis: Secondary | ICD-10-CM | POA: Diagnosis not present

## 2018-03-08 DIAGNOSIS — J3089 Other allergic rhinitis: Secondary | ICD-10-CM | POA: Diagnosis not present

## 2018-03-08 DIAGNOSIS — J301 Allergic rhinitis due to pollen: Secondary | ICD-10-CM | POA: Diagnosis not present

## 2018-03-10 IMAGING — CR DG CHEST 2V
2 series · 2 of 2 positions shown · non-contrast
Comparison: 04/22/2013 chest radiograph.

CLINICAL DATA: Cough.

EXAM:
CHEST  2 VIEW

[chest pa]
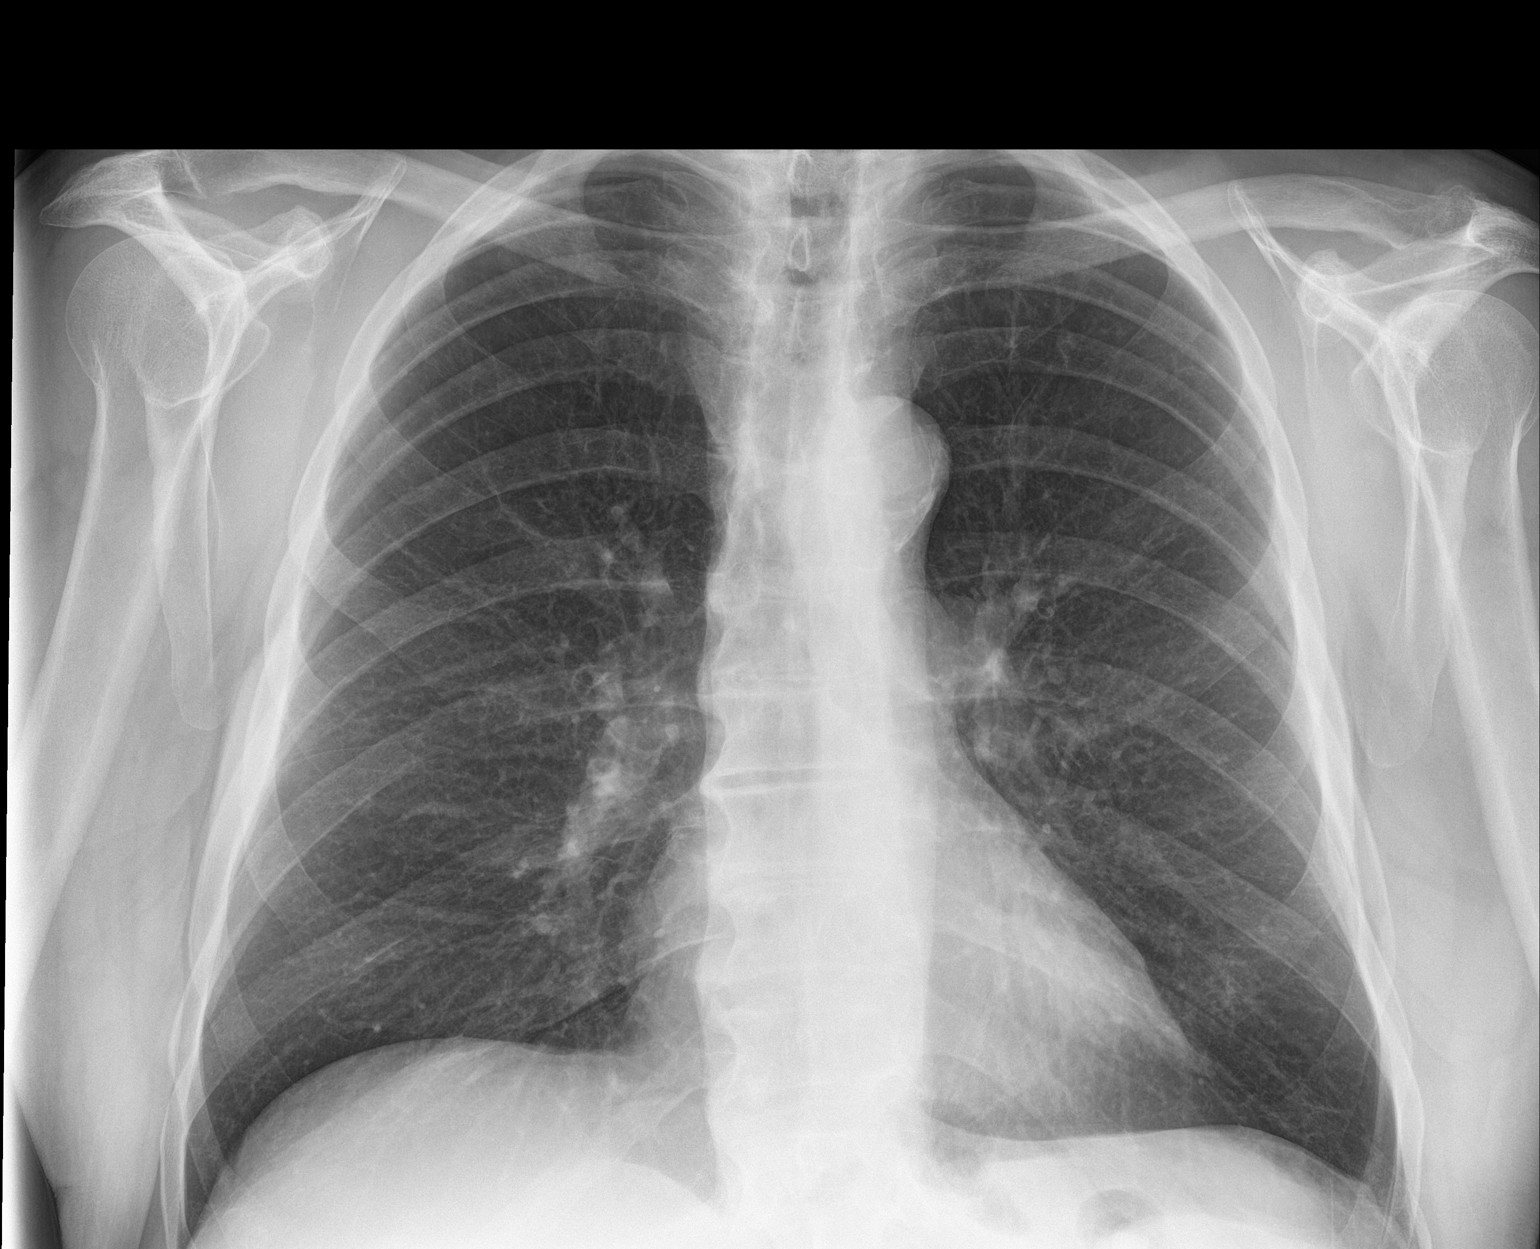

[chest lat]
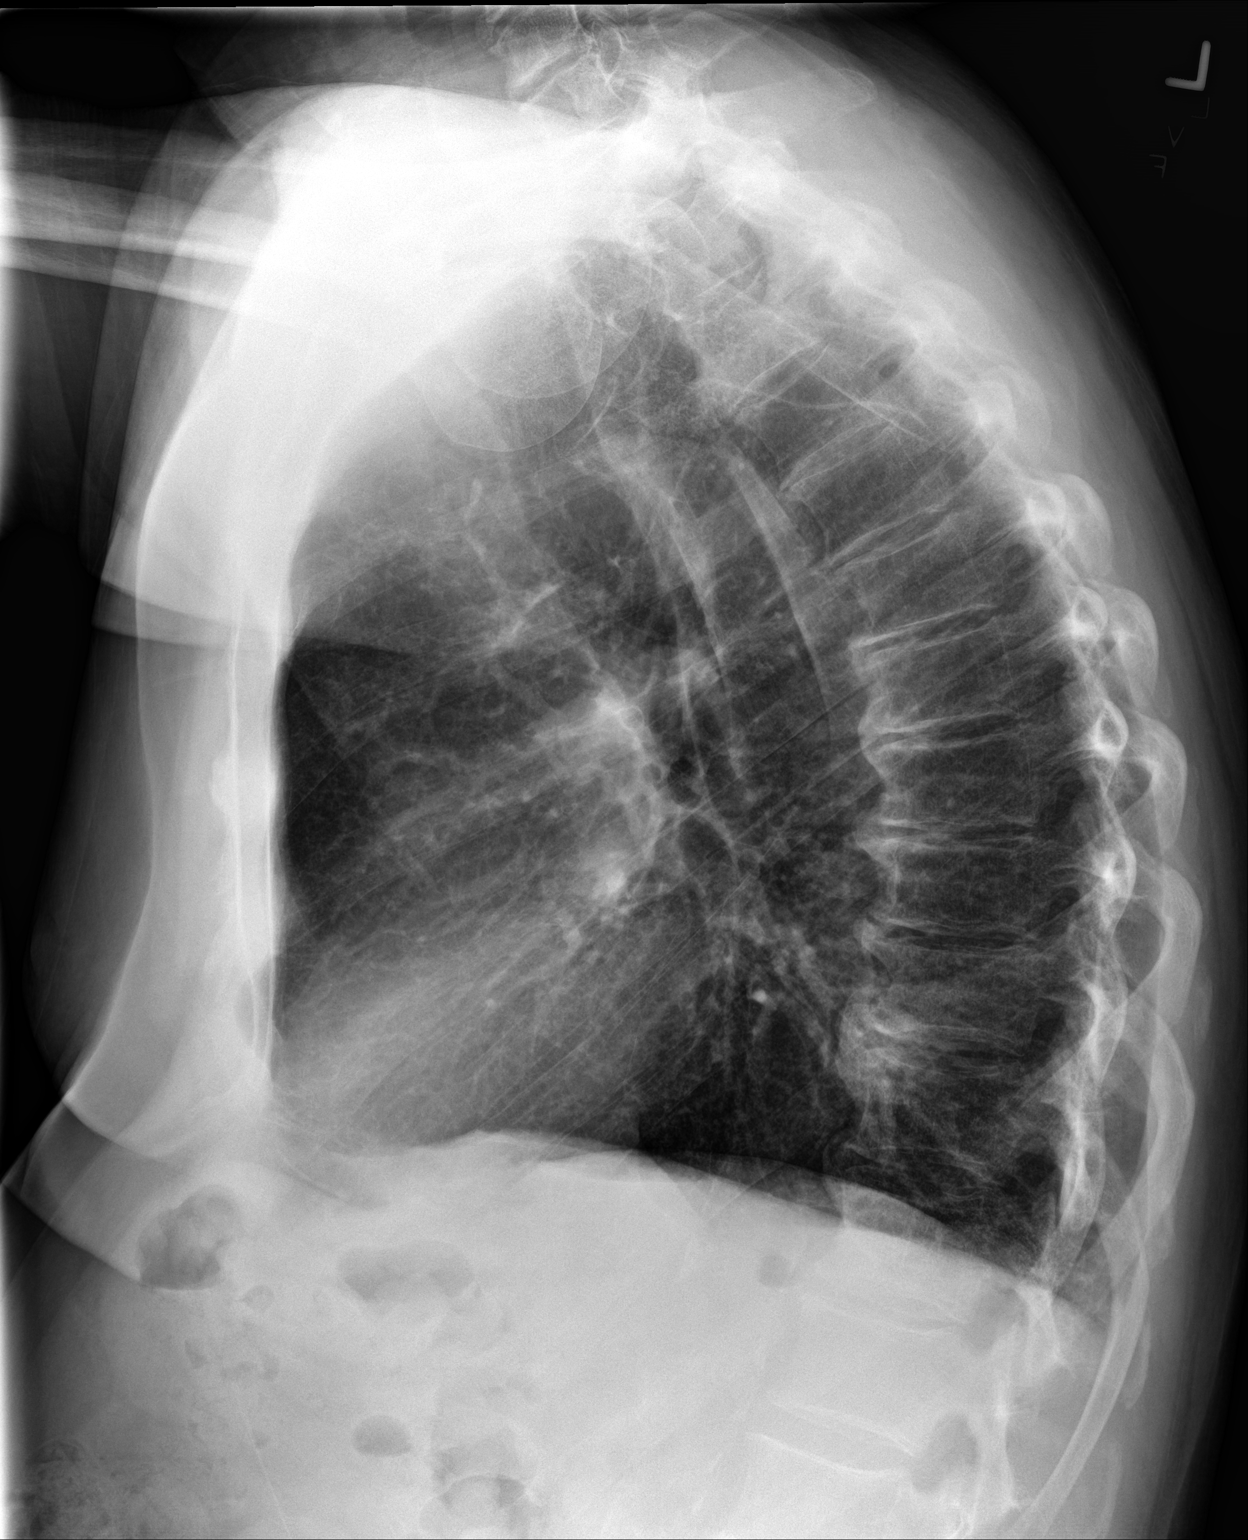

[2 of 2 positions shown; findings below may reference images not displayed]

FINDINGS: Stable cardiomediastinal silhouette with normal heart size and
aortic atherosclerosis. No pneumothorax. No pleural effusion. Mildly
hyperinflated lungs. No pulmonary edema. No acute consolidative
airspace disease. Moderate thoracic spondylosis.
IMPRESSION: 1. No acute cardiopulmonary disease.
2. Mildly hyperinflated lungs, cannot exclude obstructive lung
disease.
3. Aortic atherosclerosis.

## 2018-03-20 DIAGNOSIS — J301 Allergic rhinitis due to pollen: Secondary | ICD-10-CM | POA: Diagnosis not present

## 2018-03-20 DIAGNOSIS — J3081 Allergic rhinitis due to animal (cat) (dog) hair and dander: Secondary | ICD-10-CM | POA: Diagnosis not present

## 2018-03-20 DIAGNOSIS — J019 Acute sinusitis, unspecified: Secondary | ICD-10-CM | POA: Diagnosis not present

## 2018-03-20 DIAGNOSIS — J3089 Other allergic rhinitis: Secondary | ICD-10-CM | POA: Diagnosis not present

## 2018-03-26 DIAGNOSIS — J3089 Other allergic rhinitis: Secondary | ICD-10-CM | POA: Diagnosis not present

## 2018-03-26 DIAGNOSIS — J301 Allergic rhinitis due to pollen: Secondary | ICD-10-CM | POA: Diagnosis not present

## 2018-04-04 DIAGNOSIS — M1711 Unilateral primary osteoarthritis, right knee: Secondary | ICD-10-CM | POA: Diagnosis not present

## 2018-04-04 DIAGNOSIS — M25561 Pain in right knee: Secondary | ICD-10-CM | POA: Diagnosis not present

## 2018-04-24 DIAGNOSIS — J3089 Other allergic rhinitis: Secondary | ICD-10-CM | POA: Diagnosis not present

## 2018-04-24 DIAGNOSIS — J301 Allergic rhinitis due to pollen: Secondary | ICD-10-CM | POA: Diagnosis not present

## 2018-05-02 DIAGNOSIS — L738 Other specified follicular disorders: Secondary | ICD-10-CM | POA: Diagnosis not present

## 2018-05-02 DIAGNOSIS — L821 Other seborrheic keratosis: Secondary | ICD-10-CM | POA: Diagnosis not present

## 2018-05-02 DIAGNOSIS — L570 Actinic keratosis: Secondary | ICD-10-CM | POA: Diagnosis not present

## 2018-05-02 DIAGNOSIS — Z23 Encounter for immunization: Secondary | ICD-10-CM | POA: Diagnosis not present

## 2018-05-10 DIAGNOSIS — N401 Enlarged prostate with lower urinary tract symptoms: Secondary | ICD-10-CM | POA: Diagnosis not present

## 2018-05-10 DIAGNOSIS — Z87442 Personal history of urinary calculi: Secondary | ICD-10-CM | POA: Diagnosis not present

## 2018-05-10 DIAGNOSIS — R351 Nocturia: Secondary | ICD-10-CM | POA: Diagnosis not present

## 2018-05-14 DIAGNOSIS — J301 Allergic rhinitis due to pollen: Secondary | ICD-10-CM | POA: Diagnosis not present

## 2018-05-14 DIAGNOSIS — J3089 Other allergic rhinitis: Secondary | ICD-10-CM | POA: Diagnosis not present

## 2018-05-15 DIAGNOSIS — C44319 Basal cell carcinoma of skin of other parts of face: Secondary | ICD-10-CM | POA: Diagnosis not present

## 2018-06-05 DIAGNOSIS — C44712 Basal cell carcinoma of skin of right lower limb, including hip: Secondary | ICD-10-CM | POA: Diagnosis not present

## 2018-06-05 DIAGNOSIS — C44519 Basal cell carcinoma of skin of other part of trunk: Secondary | ICD-10-CM | POA: Diagnosis not present

## 2018-06-11 DIAGNOSIS — J3089 Other allergic rhinitis: Secondary | ICD-10-CM | POA: Diagnosis not present

## 2018-06-11 DIAGNOSIS — J301 Allergic rhinitis due to pollen: Secondary | ICD-10-CM | POA: Diagnosis not present

## 2018-06-14 DIAGNOSIS — H1045 Other chronic allergic conjunctivitis: Secondary | ICD-10-CM | POA: Diagnosis not present

## 2018-06-14 DIAGNOSIS — J3081 Allergic rhinitis due to animal (cat) (dog) hair and dander: Secondary | ICD-10-CM | POA: Diagnosis not present

## 2018-06-14 DIAGNOSIS — J301 Allergic rhinitis due to pollen: Secondary | ICD-10-CM | POA: Diagnosis not present

## 2018-06-14 DIAGNOSIS — J3089 Other allergic rhinitis: Secondary | ICD-10-CM | POA: Diagnosis not present

## 2018-06-21 DIAGNOSIS — K625 Hemorrhage of anus and rectum: Secondary | ICD-10-CM | POA: Diagnosis not present

## 2018-06-27 DIAGNOSIS — M24561 Contracture, right knee: Secondary | ICD-10-CM | POA: Diagnosis not present

## 2018-06-27 DIAGNOSIS — M1711 Unilateral primary osteoarthritis, right knee: Secondary | ICD-10-CM | POA: Diagnosis not present

## 2018-07-05 DIAGNOSIS — J301 Allergic rhinitis due to pollen: Secondary | ICD-10-CM | POA: Diagnosis not present

## 2018-07-05 DIAGNOSIS — M1711 Unilateral primary osteoarthritis, right knee: Secondary | ICD-10-CM | POA: Diagnosis not present

## 2018-07-05 DIAGNOSIS — M24561 Contracture, right knee: Secondary | ICD-10-CM | POA: Diagnosis not present

## 2018-07-05 DIAGNOSIS — J3089 Other allergic rhinitis: Secondary | ICD-10-CM | POA: Diagnosis not present

## 2018-07-30 DIAGNOSIS — J301 Allergic rhinitis due to pollen: Secondary | ICD-10-CM | POA: Diagnosis not present

## 2018-07-30 DIAGNOSIS — J3089 Other allergic rhinitis: Secondary | ICD-10-CM | POA: Diagnosis not present

## 2018-08-27 DIAGNOSIS — J301 Allergic rhinitis due to pollen: Secondary | ICD-10-CM | POA: Diagnosis not present

## 2018-08-27 DIAGNOSIS — J3089 Other allergic rhinitis: Secondary | ICD-10-CM | POA: Diagnosis not present

## 2018-09-12 DIAGNOSIS — L821 Other seborrheic keratosis: Secondary | ICD-10-CM | POA: Diagnosis not present

## 2018-09-12 DIAGNOSIS — D225 Melanocytic nevi of trunk: Secondary | ICD-10-CM | POA: Diagnosis not present

## 2018-09-12 DIAGNOSIS — D485 Neoplasm of uncertain behavior of skin: Secondary | ICD-10-CM | POA: Diagnosis not present

## 2018-09-12 DIAGNOSIS — D2271 Melanocytic nevi of right lower limb, including hip: Secondary | ICD-10-CM | POA: Diagnosis not present

## 2018-09-12 DIAGNOSIS — C44319 Basal cell carcinoma of skin of other parts of face: Secondary | ICD-10-CM | POA: Diagnosis not present

## 2018-09-12 DIAGNOSIS — Z85828 Personal history of other malignant neoplasm of skin: Secondary | ICD-10-CM | POA: Diagnosis not present

## 2018-09-12 DIAGNOSIS — L731 Pseudofolliculitis barbae: Secondary | ICD-10-CM | POA: Diagnosis not present

## 2018-09-12 DIAGNOSIS — Z86018 Personal history of other benign neoplasm: Secondary | ICD-10-CM | POA: Diagnosis not present

## 2018-09-13 DIAGNOSIS — R7301 Impaired fasting glucose: Secondary | ICD-10-CM | POA: Diagnosis not present

## 2018-09-13 DIAGNOSIS — R82998 Other abnormal findings in urine: Secondary | ICD-10-CM | POA: Diagnosis not present

## 2018-09-13 DIAGNOSIS — I10 Essential (primary) hypertension: Secondary | ICD-10-CM | POA: Diagnosis not present

## 2018-09-14 DIAGNOSIS — M545 Low back pain: Secondary | ICD-10-CM | POA: Diagnosis not present

## 2018-09-20 DIAGNOSIS — Z87442 Personal history of urinary calculi: Secondary | ICD-10-CM | POA: Diagnosis not present

## 2018-09-20 DIAGNOSIS — I700 Atherosclerosis of aorta: Secondary | ICD-10-CM | POA: Diagnosis not present

## 2018-09-20 DIAGNOSIS — Z1331 Encounter for screening for depression: Secondary | ICD-10-CM | POA: Diagnosis not present

## 2018-09-20 DIAGNOSIS — Z0000 Encounter for general adult medical examination without abnormal findings: Secondary | ICD-10-CM | POA: Diagnosis not present

## 2018-09-20 DIAGNOSIS — R7301 Impaired fasting glucose: Secondary | ICD-10-CM | POA: Diagnosis not present

## 2018-09-20 DIAGNOSIS — M545 Low back pain: Secondary | ICD-10-CM | POA: Diagnosis not present

## 2018-09-20 DIAGNOSIS — I10 Essential (primary) hypertension: Secondary | ICD-10-CM | POA: Diagnosis not present

## 2018-09-20 DIAGNOSIS — G250 Essential tremor: Secondary | ICD-10-CM | POA: Diagnosis not present

## 2018-09-20 DIAGNOSIS — N400 Enlarged prostate without lower urinary tract symptoms: Secondary | ICD-10-CM | POA: Diagnosis not present

## 2018-09-26 ENCOUNTER — Emergency Department (HOSPITAL_COMMUNITY): Payer: PPO

## 2018-09-26 ENCOUNTER — Encounter (HOSPITAL_COMMUNITY): Payer: Self-pay | Admitting: Emergency Medicine

## 2018-09-26 ENCOUNTER — Emergency Department (HOSPITAL_COMMUNITY)
Admission: EM | Admit: 2018-09-26 | Discharge: 2018-09-27 | Disposition: A | Discharge status: Home / Self Care | Payer: PPO | Attending: Emergency Medicine | Admitting: Emergency Medicine

## 2018-09-26 ENCOUNTER — Other Ambulatory Visit: Payer: Self-pay

## 2018-09-26 DIAGNOSIS — R1013 Epigastric pain: Secondary | ICD-10-CM | POA: Diagnosis present

## 2018-09-26 DIAGNOSIS — Z8673 Personal history of transient ischemic attack (TIA), and cerebral infarction without residual deficits: Secondary | ICD-10-CM | POA: Insufficient documentation

## 2018-09-26 DIAGNOSIS — Y92009 Unspecified place in unspecified non-institutional (private) residence as the place of occurrence of the external cause: Secondary | ICD-10-CM | POA: Insufficient documentation

## 2018-09-26 DIAGNOSIS — Z87891 Personal history of nicotine dependence: Secondary | ICD-10-CM | POA: Diagnosis not present

## 2018-09-26 DIAGNOSIS — Y999 Unspecified external cause status: Secondary | ICD-10-CM | POA: Insufficient documentation

## 2018-09-26 DIAGNOSIS — R079 Chest pain, unspecified: Secondary | ICD-10-CM | POA: Diagnosis not present

## 2018-09-26 DIAGNOSIS — Y33XXXA Other specified events, undetermined intent, initial encounter: Secondary | ICD-10-CM | POA: Diagnosis not present

## 2018-09-26 DIAGNOSIS — Z79899 Other long term (current) drug therapy: Secondary | ICD-10-CM | POA: Diagnosis not present

## 2018-09-26 DIAGNOSIS — Z96651 Presence of right artificial knee joint: Secondary | ICD-10-CM | POA: Insufficient documentation

## 2018-09-26 DIAGNOSIS — Z85828 Personal history of other malignant neoplasm of skin: Secondary | ICD-10-CM | POA: Diagnosis not present

## 2018-09-26 DIAGNOSIS — Y9389 Activity, other specified: Secondary | ICD-10-CM | POA: Diagnosis not present

## 2018-09-26 DIAGNOSIS — M545 Low back pain: Secondary | ICD-10-CM | POA: Diagnosis not present

## 2018-09-26 DIAGNOSIS — T18128A Food in esophagus causing other injury, initial encounter: Secondary | ICD-10-CM | POA: Diagnosis not present

## 2018-09-26 DIAGNOSIS — I10 Essential (primary) hypertension: Secondary | ICD-10-CM | POA: Insufficient documentation

## 2018-09-26 LAB — BASIC METABOLIC PANEL
Anion gap: 12 (ref 5–15)
BUN: 15 mg/dL (ref 8–23)
CO2: 23 mmol/L (ref 22–32)
Calcium: 9.4 mg/dL (ref 8.9–10.3)
Chloride: 107 mmol/L (ref 98–111)
Creatinine, Ser: 0.93 mg/dL (ref 0.61–1.24)
GFR calc Af Amer: >60 mL/min (ref >60)
GFR calc non Af Amer: >60 mL/min (ref >60)
Glucose, Bld: 97 mg/dL (ref 70–99)
Potassium: 4 mmol/L (ref 3.5–5.1)
Sodium: 142 mmol/L (ref 135–145)

## 2018-09-26 LAB — CBC
HCT: 45.1 % (ref 39.0–52.0)
Hemoglobin: 15.5 g/dL (ref 13.0–17.0)
MCH: 31.6 pg (ref 26.0–34.0)
MCHC: 34.4 g/dL (ref 30.0–36.0)
MCV: 91.9 fL (ref 80.0–100.0)
Platelets: 238 10*3/uL (ref 150–400)
RBC: 4.91 MIL/uL (ref 4.22–5.81)
RDW: 12.1 % (ref 11.5–15.5)
WBC: 5 10*3/uL (ref 4.0–10.5)
nRBC: 0 % (ref 0.0–0.2)

## 2018-09-26 LAB — TROPONIN I: Troponin I: <0.03 ng/mL (ref <0.03)

## 2018-09-26 MED ORDER — SODIUM CHLORIDE 0.9% FLUSH: 3.0000 mL | Freq: Once | INTRAVENOUS | Status: DC

## 2018-09-26 NOTE — ED Triage Notes (Signed)
Pt c/o chest pain while eating, states it felt like a knot, had an episode of vomiting. Pt states the pain has mostly resolved at this time.

## 2018-09-27 ENCOUNTER — Other Ambulatory Visit: Payer: Self-pay | Admitting: Internal Medicine

## 2018-09-27 DIAGNOSIS — I700 Atherosclerosis of aorta: Secondary | ICD-10-CM

## 2018-09-27 LAB — TROPONIN I: Troponin I: <0.03 ng/mL (ref <0.03)

## 2018-09-27 MED ORDER — HYDROCHLOROTHIAZIDE 12.5 MG PO CAPS: 12.5000 mg | ORAL_CAPSULE | Freq: Every day | ORAL | Status: DC

## 2018-09-27 MED ORDER — LISINOPRIL 10 MG PO TABS
10.0000 mg | ORAL_TABLET | Freq: Once | ORAL | Status: AC
  Administered 2018-09-27: 10 mg via ORAL
  Filled 2018-09-27: qty 1

## 2018-09-27 NOTE — ED Notes (Signed)
ED Provider at bedside. 

## 2018-09-27 NOTE — ED Provider Notes (Signed)
Emergency Department Provider Note   I have reviewed the triage vital signs and the nursing notes.   HISTORY  Chief Complaint Chest Pain   HPI Ross [name]. is a 78 y.o. male history of hypertension, nephrolithiasis and stroke who presents the emergency department today with chest pain.  Patient states that the last few meals he has had episodes of epigastric discomfort when swallowing is been intermittent but tonight he had an episode while eating steak with significant epigastric pain did not get better.  He did not have any coughing or fever.  He had nausea initially and did eventually vomit and the pain slowly improved after that.  He has a history of gastroesophageal reflux and has had endoscopy but not in the last few years.  States compliance with medications.  Had no associated headedness, shortness of breath, fever, cough or diaphoresis.  He states he went over to his neighbor who used to be his primary doctor who told him he should be evaluated here.  On my evaluation (approximately 5 hours after being in the emergency room) patient is entirely asymptomatic.   No other associated or modifying symptoms.    Past Medical History:  Diagnosis Date  . 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 Essential and other specified forms of tremor 08/30/2013  . Hypertension   . Kidney stones   . Stroke [facility]) 2005    Patient Active Problem List   Diagnosis Date Noted  . 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 Aortic calcification ([place]) 09/28/2015  . Bruit of right carotid artery 09/28/2015  . Essential and other specified forms of tremor 08/30/2013    Past Surgical History:  Procedure Laterality Date  . APPENDECTOMY    . CATARACT EXTRACTION     bilateral  . CHOLECYSTECTOMY    . CYSTOSCOPY WITH RETROGRADE PYELOGRAM, URETEROSCOPY AND STENT PLACEMENT Left 06/11/2013   Procedure: CYSTOSCOPY WITH RETROGRADE Left PYELOGRAM, URETEROSCOPY AND Left STENT PLACEMENT;  Surgeon:  Ailene Rud, MD;  [facility];  Service: Urology;  Laterality: Left;  . KNEE SURGERY     bilateral arthroscopic  . SHOULDER SURGERY     right   . TOTAL KNEE ARTHROPLASTY Right 12/06/2016   Procedure: RIGHT TOTAL KNEE ARTHROPLASTY;  Surgeon: Paralee Cancel, MD;  [facility];  Service: Orthopedics;  Laterality: Right;  70 mins    Current Outpatient Rx  . Order #: [id number] Class: Normal  . Order #: [id number] Class: Historical Med  . Order #: [id number] Class: Historical Med  . Order #: [id number] Class: Historical Med  . Order #: [id number] Class: Historical Med  . Order #: [id number] Class: Historical Med  . Order #: [id number] Class: Historical Med  . Order #: [id number] Class: Historical Med  . Order #: [id number] Class: Historical Med  . Order #: [id number] Class: Historical Med  . Order #: [id number] Class: Historical Med  . Order #: [id number] Class: Historical Med  . Order #: [id number] Class: Historical Med    Allergies Dexlansoprazole  Family History  Problem Relation Age of Onset  . Stroke Mother 1  . COPD Mother   . Heart attack Father 51  . Lymphoma Father        died age 29  . Cancer - Other Brother        esophageal  . Fibromyalgia Brother     Social History Social History   Tobacco Use  . Smoking status: Former Smoker    Packs/day: 0.50    Years: 36.00    Pack  years: 18.00    Types: Cigarettes    Last attempt to quit: 09/15/1996    Years since quitting: 22.0  . Smokeless tobacco: Never Used  . Tobacco comment: Quit 20 years ago.   Substance Use Topics  . Alcohol use: Yes    Alcohol/week: 2.0 standard drinks    Types: 2 Shots of liquor per week  . Drug use: No    Review of Systems  All other systems negative except as documented in the HPI. All pertinent positives and negatives as reviewed in the HPI. ____________________________________________   PHYSICAL EXAM:  VITAL SIGNS: ED Triage Vitals [09/26/18 2030]  Enc Vitals Group     BP (!)  144/68     Pulse Rate 81     Resp 18     Temp 98.2 F (36.8 C)     Temp Source Oral     SpO2 97 %    Constitutional: Alert and oriented. Well appearing and in no acute distress. Eyes: Conjunctivae are normal. PERRL. EOMI. Head: Atraumatic. Nose: No congestion/rhinnorhea. Mouth/Throat: Mucous membranes are moist.  Oropharynx non-erythematous. Neck: No stridor.  No meningeal signs.   Cardiovascular: Normal rate, regular rhythm. Good peripheral circulation. Grossly normal heart sounds.   Respiratory: Normal respiratory effort.  No retractions. Lungs CTAB. Gastrointestinal: Soft and nontender. No distention.  Musculoskeletal: No lower extremity tenderness nor edema. No gross deformities of extremities. Neurologic:  Normal speech and language. No gross focal neurologic deficits are appreciated.  Skin:  Skin is warm, dry and intact. No rash noted.   ____________________________________________   LABS (all labs ordered are listed, but only abnormal results are displayed)  Labs Reviewed  BASIC METABOLIC PANEL  CBC  TROPONIN I  TROPONIN I   ____________________________________________  EKG   EKG Interpretation  Date/Time:  Wednesday September 26 2018 20:26:04 EDT Ventricular Rate:  79 PR Interval:  190 QRS Duration: 86 QT Interval:  362 QTC Calculation: 415 R Axis:   59 Text Interpretation:  Normal sinus rhythm Possible Anterior infarct , age undetermined Abnormal ECG No significant change since last tracing Confirmed by Merrily Pew [phone]) on 09/27/2018 2:21:02 AM       ____________________________________________  RADIOLOGY  Dg Chest 2 View  Result Date: 09/26/2018 CLINICAL DATA:  Chest pain EXAM: CHEST - 2 VIEW COMPARISON:  April 11, 2016. FINDINGS: There is no edema or consolidation. The heart size and pulmonary vascularity are normal. No adenopathy. There is aortic atherosclerosis. There is degenerative change in the thoracic spine. IMPRESSION: No edema or  consolidation. Stable cardiac silhouette. Aortic Atherosclerosis (ICD10-I70.0). Electronically Signed   By: Lowella Grip III M.D.   On: 09/26/2018 20:56    ____________________________________________   PROCEDURES  Procedure(s) performed:   Procedures   ____________________________________________   INITIAL IMPRESSION / ASSESSMENT AND PLAN / ED COURSE  I suspect patient had esophageal impaction that improved with the vomiting and thus his pain is improved.  He does have some risk factors for heart disease so get a second troponin just to ensure no elevation.  I suspect that he will need to get another endoscopy to evaluate for any Barrett's esophagus or Schatzki's rings but no indication to get that done tonight emergently..  Second lab okay.  Patient persistently asymptomatic.  Once again I suspect this is likely esophageal food impaction that cleared.  Will need GI follow-up.  Low suspicion for ACS at this time.  Pertinent labs & imaging results that were available during my care of the patient were  reviewed by me and considered in my medical decision making (see chart for details).  A medical screening exam was performed and I feel the patient has had an appropriate workup for their chief complaint at this time and likelihood of emergent condition existing is low. They have been counseled on decision, discharge, follow up and which symptoms necessitate immediate return to the emergency department. They or their family verbally stated understanding and agreement with plan and discharged in stable condition.   ____________________________________________  FINAL CLINICAL IMPRESSION(S) / ED DIAGNOSES  Final diagnoses:  Food impaction of esophagus, initial encounter  Chest pain, unspecified type     MEDICATIONS GIVEN DURING THIS VISIT:  Medications  sodium chloride flush (NS) 0.9 % injection 3 mL (3 mLs Intravenous Not Given 09/27/18 0251)  hydrochlorothiazide (MICROZIDE)  capsule 12.5 mg (has no administration in time range)  lisinopril (ZESTRIL) tablet 10 mg (10 mg Oral Given 09/27/18 0255)     NEW OUTPATIENT MEDICATIONS STARTED DURING THIS VISIT:  Discharge Medication List as of 09/27/2018  4:50 AM      Note:  This note was prepared with assistance of Dragon voice recognition software. Occasional wrong-word or sound-a-like substitutions may have occurred due to the inherent limitations of voice recognition software.   Zadyn Yardley, Corene Cornea, MD 09/27/18 [phone]

## 2018-09-27 NOTE — ED Notes (Signed)
Patient verbalized understanding of dc instructions, vss, ambulatory with nad.   

## 2018-10-01 DIAGNOSIS — J3089 Other allergic rhinitis: Secondary | ICD-10-CM | POA: Diagnosis not present

## 2018-10-01 DIAGNOSIS — J301 Allergic rhinitis due to pollen: Secondary | ICD-10-CM | POA: Diagnosis not present

## 2018-10-29 DIAGNOSIS — J3089 Other allergic rhinitis: Secondary | ICD-10-CM | POA: Diagnosis not present

## 2018-10-29 DIAGNOSIS — J301 Allergic rhinitis due to pollen: Secondary | ICD-10-CM | POA: Diagnosis not present

## 2018-10-31 DIAGNOSIS — M1711 Unilateral primary osteoarthritis, right knee: Secondary | ICD-10-CM | POA: Diagnosis not present

## 2018-10-31 DIAGNOSIS — M24561 Contracture, right knee: Secondary | ICD-10-CM | POA: Diagnosis not present

## 2018-11-07 DIAGNOSIS — M1711 Unilateral primary osteoarthritis, right knee: Secondary | ICD-10-CM | POA: Diagnosis not present

## 2018-11-07 DIAGNOSIS — M24561 Contracture, right knee: Secondary | ICD-10-CM | POA: Diagnosis not present

## 2018-11-08 DIAGNOSIS — K625 Hemorrhage of anus and rectum: Secondary | ICD-10-CM | POA: Diagnosis not present

## 2018-11-14 DIAGNOSIS — M1711 Unilateral primary osteoarthritis, right knee: Secondary | ICD-10-CM | POA: Diagnosis not present

## 2018-11-14 DIAGNOSIS — M24561 Contracture, right knee: Secondary | ICD-10-CM | POA: Diagnosis not present

## 2018-11-21 DIAGNOSIS — M1711 Unilateral primary osteoarthritis, right knee: Secondary | ICD-10-CM | POA: Diagnosis not present

## 2018-11-21 DIAGNOSIS — M24561 Contracture, right knee: Secondary | ICD-10-CM | POA: Diagnosis not present

## 2018-11-26 DIAGNOSIS — J3089 Other allergic rhinitis: Secondary | ICD-10-CM | POA: Diagnosis not present

## 2018-11-26 DIAGNOSIS — J301 Allergic rhinitis due to pollen: Secondary | ICD-10-CM | POA: Diagnosis not present

## 2018-11-28 DIAGNOSIS — M1711 Unilateral primary osteoarthritis, right knee: Secondary | ICD-10-CM | POA: Diagnosis not present

## 2018-11-28 DIAGNOSIS — M24561 Contracture, right knee: Secondary | ICD-10-CM | POA: Diagnosis not present

## 2018-12-05 DIAGNOSIS — M1711 Unilateral primary osteoarthritis, right knee: Secondary | ICD-10-CM | POA: Diagnosis not present

## 2018-12-05 DIAGNOSIS — M24561 Contracture, right knee: Secondary | ICD-10-CM | POA: Diagnosis not present

## 2018-12-07 DIAGNOSIS — C44319 Basal cell carcinoma of skin of other parts of face: Secondary | ICD-10-CM | POA: Diagnosis not present

## 2018-12-07 DIAGNOSIS — M24561 Contracture, right knee: Secondary | ICD-10-CM | POA: Diagnosis not present

## 2018-12-07 DIAGNOSIS — L570 Actinic keratosis: Secondary | ICD-10-CM | POA: Diagnosis not present

## 2018-12-07 DIAGNOSIS — M1711 Unilateral primary osteoarthritis, right knee: Secondary | ICD-10-CM | POA: Diagnosis not present

## 2018-12-12 DIAGNOSIS — Z96651 Presence of right artificial knee joint: Secondary | ICD-10-CM | POA: Diagnosis not present

## 2018-12-12 DIAGNOSIS — M1711 Unilateral primary osteoarthritis, right knee: Secondary | ICD-10-CM | POA: Diagnosis not present

## 2018-12-12 DIAGNOSIS — M24561 Contracture, right knee: Secondary | ICD-10-CM | POA: Diagnosis not present

## 2018-12-19 DIAGNOSIS — M24561 Contracture, right knee: Secondary | ICD-10-CM | POA: Diagnosis not present

## 2018-12-19 DIAGNOSIS — Z96651 Presence of right artificial knee joint: Secondary | ICD-10-CM | POA: Diagnosis not present

## 2018-12-19 DIAGNOSIS — M1711 Unilateral primary osteoarthritis, right knee: Secondary | ICD-10-CM | POA: Diagnosis not present

## 2018-12-24 DIAGNOSIS — J3089 Other allergic rhinitis: Secondary | ICD-10-CM | POA: Diagnosis not present

## 2018-12-24 DIAGNOSIS — J301 Allergic rhinitis due to pollen: Secondary | ICD-10-CM | POA: Diagnosis not present

## 2018-12-26 DIAGNOSIS — Z96651 Presence of right artificial knee joint: Secondary | ICD-10-CM | POA: Diagnosis not present

## 2018-12-26 DIAGNOSIS — M24561 Contracture, right knee: Secondary | ICD-10-CM | POA: Diagnosis not present

## 2018-12-26 DIAGNOSIS — M1711 Unilateral primary osteoarthritis, right knee: Secondary | ICD-10-CM | POA: Diagnosis not present

## 2019-01-03 DIAGNOSIS — M1711 Unilateral primary osteoarthritis, right knee: Secondary | ICD-10-CM | POA: Diagnosis not present

## 2019-01-03 DIAGNOSIS — Z96651 Presence of right artificial knee joint: Secondary | ICD-10-CM | POA: Diagnosis not present

## 2019-01-03 DIAGNOSIS — M24561 Contracture, right knee: Secondary | ICD-10-CM | POA: Diagnosis not present

## 2019-01-07 DIAGNOSIS — M24561 Contracture, right knee: Secondary | ICD-10-CM | POA: Diagnosis not present

## 2019-01-07 DIAGNOSIS — M1711 Unilateral primary osteoarthritis, right knee: Secondary | ICD-10-CM | POA: Diagnosis not present

## 2019-01-07 DIAGNOSIS — Z96651 Presence of right artificial knee joint: Secondary | ICD-10-CM | POA: Diagnosis not present

## 2019-01-21 DIAGNOSIS — J3089 Other allergic rhinitis: Secondary | ICD-10-CM | POA: Diagnosis not present

## 2019-01-21 DIAGNOSIS — J301 Allergic rhinitis due to pollen: Secondary | ICD-10-CM | POA: Diagnosis not present

## 2019-01-22 DIAGNOSIS — M24561 Contracture, right knee: Secondary | ICD-10-CM | POA: Diagnosis not present

## 2019-01-22 DIAGNOSIS — M1711 Unilateral primary osteoarthritis, right knee: Secondary | ICD-10-CM | POA: Diagnosis not present

## 2019-01-22 DIAGNOSIS — Z96651 Presence of right artificial knee joint: Secondary | ICD-10-CM | POA: Diagnosis not present

## 2019-01-23 DIAGNOSIS — J301 Allergic rhinitis due to pollen: Secondary | ICD-10-CM | POA: Diagnosis not present

## 2019-01-23 DIAGNOSIS — J3089 Other allergic rhinitis: Secondary | ICD-10-CM | POA: Diagnosis not present

## 2019-02-18 DIAGNOSIS — Z471 Aftercare following joint replacement surgery: Secondary | ICD-10-CM | POA: Diagnosis not present

## 2019-02-18 DIAGNOSIS — J3089 Other allergic rhinitis: Secondary | ICD-10-CM | POA: Diagnosis not present

## 2019-02-18 DIAGNOSIS — Z96651 Presence of right artificial knee joint: Secondary | ICD-10-CM | POA: Diagnosis not present

## 2019-02-18 DIAGNOSIS — J301 Allergic rhinitis due to pollen: Secondary | ICD-10-CM | POA: Diagnosis not present

## 2019-02-22 DIAGNOSIS — J3089 Other allergic rhinitis: Secondary | ICD-10-CM | POA: Diagnosis not present

## 2019-02-22 DIAGNOSIS — J301 Allergic rhinitis due to pollen: Secondary | ICD-10-CM | POA: Diagnosis not present

## 2019-02-27 DIAGNOSIS — J301 Allergic rhinitis due to pollen: Secondary | ICD-10-CM | POA: Diagnosis not present

## 2019-02-27 DIAGNOSIS — J3089 Other allergic rhinitis: Secondary | ICD-10-CM | POA: Diagnosis not present

## 2019-03-04 DIAGNOSIS — J301 Allergic rhinitis due to pollen: Secondary | ICD-10-CM | POA: Diagnosis not present

## 2019-03-04 DIAGNOSIS — J3089 Other allergic rhinitis: Secondary | ICD-10-CM | POA: Diagnosis not present

## 2019-03-06 DIAGNOSIS — J3089 Other allergic rhinitis: Secondary | ICD-10-CM | POA: Diagnosis not present

## 2019-03-06 DIAGNOSIS — J301 Allergic rhinitis due to pollen: Secondary | ICD-10-CM | POA: Diagnosis not present

## 2019-04-03 DIAGNOSIS — J3089 Other allergic rhinitis: Secondary | ICD-10-CM | POA: Diagnosis not present

## 2019-04-03 DIAGNOSIS — J301 Allergic rhinitis due to pollen: Secondary | ICD-10-CM | POA: Diagnosis not present

## 2019-04-24 DIAGNOSIS — J3089 Other allergic rhinitis: Secondary | ICD-10-CM | POA: Diagnosis not present

## 2019-04-24 DIAGNOSIS — J301 Allergic rhinitis due to pollen: Secondary | ICD-10-CM | POA: Diagnosis not present

## 2019-04-30 DIAGNOSIS — Z87442 Personal history of urinary calculi: Secondary | ICD-10-CM | POA: Diagnosis not present

## 2019-05-06 DIAGNOSIS — N401 Enlarged prostate with lower urinary tract symptoms: Secondary | ICD-10-CM | POA: Diagnosis not present

## 2019-05-06 DIAGNOSIS — N200 Calculus of kidney: Secondary | ICD-10-CM | POA: Diagnosis not present

## 2019-05-06 DIAGNOSIS — R351 Nocturia: Secondary | ICD-10-CM | POA: Diagnosis not present

## 2019-05-22 DIAGNOSIS — J301 Allergic rhinitis due to pollen: Secondary | ICD-10-CM | POA: Diagnosis not present

## 2019-05-22 DIAGNOSIS — I10 Essential (primary) hypertension: Secondary | ICD-10-CM | POA: Insufficient documentation

## 2019-05-22 DIAGNOSIS — Z7189 Other specified counseling: Secondary | ICD-10-CM | POA: Insufficient documentation

## 2019-05-22 DIAGNOSIS — J3089 Other allergic rhinitis: Secondary | ICD-10-CM | POA: Diagnosis not present

## 2019-05-22 NOTE — Progress Notes (Signed)
Cardiology Office Note   Date:  05/23/2019   ID:  Ross Gardocki., DOB [birth year], MRN [id number]  PCP:  Marton Redwood, MD  Cardiologist:   Minus Breeding, MD   Chief Complaint  Patient presents with  . Aortic Atherosclerosis      History of Present Illness: Ross Gongwer. is a 79 y.o. male who presents for evaluation of aortic calcification. He's being evaluated for kidney stone and had a CT. He had some calcium in his aorta down into his iliacs.  Echo in 2012 demonstrated normal left ventricular function with some mild aortic valve calcification. He's had minimal plaque on carotid Dopplers. Stress perfusion study in 2008 demonstrated no evidence of ischemia. POET (Plain Old Exercise Treadmill) in 2016 had some nonspecific ST changes but no clear evidence of ischemia.  He returns for follow up.    Since I last saw him he has done well.  He walks routinely.  He still has a little knee pain from a total knee replacement but he is able to do significant physical activity. The patient denies any new symptoms such as chest discomfort, neck or arm discomfort. There has been no new shortness of breath, PND or orthopnea. There have been no reported palpitations, presyncope or syncope.    Past Medical History:  Diagnosis Date  . 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 Essential and other specified forms of tremor 08/30/2013  . Hypertension   . Kidney stones   . Stroke [facility]) 2005    Past Surgical History:  Procedure Laterality Date  . APPENDECTOMY    . CATARACT EXTRACTION     bilateral  . CHOLECYSTECTOMY    . CYSTOSCOPY WITH RETROGRADE PYELOGRAM, URETEROSCOPY AND STENT PLACEMENT Left 06/11/2013   Procedure: CYSTOSCOPY WITH RETROGRADE Left PYELOGRAM, URETEROSCOPY AND Left STENT PLACEMENT;  Surgeon: Ailene Rud, MD;  [facility];  Service: Urology;  Laterality: Left;  . KNEE SURGERY     bilateral arthroscopic  . SHOULDER SURGERY     right   . TOTAL KNEE ARTHROPLASTY  Right 12/06/2016   Procedure: RIGHT TOTAL KNEE ARTHROPLASTY;  Surgeon: Paralee Cancel, MD;  [facility];  Service: Orthopedics;  Laterality: Right;  70 mins     Current Outpatient Medications  Medication Sig Dispense Refill  . aspirin 81 MG chewable tablet Chew 1 tablet (81 mg total) by mouth 2 (two) times daily. (Patient taking differently: Chew 81 mg by mouth daily. ) 60 tablet 0  . b complex vitamins tablet Take 1 tablet by mouth daily.     . cetirizine (ZYRTEC) 10 MG tablet Take 10 mg by mouth as needed for allergies.     . Cholecalciferol (VITAMIN D) 2000 units CAPS Take 2,000 Units by mouth daily.    Marland Kitchen EPINEPHrine 0.3 mg/0.3 mL IJ SOAJ injection Inject 0.3 mg into the muscle daily as needed (allergic reaction).    . fluticasone (FLONASE) 50 MCG/ACT nasal spray Place 1 spray into both nostrils daily as needed for allergies.     Marland Kitchen ketoconazole (NIZORAL) 2 % cream Apply 1 application topically daily as needed for irritation.    Marland Kitchen lisinopril-hydrochlorothiazide (PRINZIDE,ZESTORETIC) 10-12.5 MG per tablet Take 1 tablet by mouth at bedtime.     . Multiple Vitamin (MULTIVITAMIN) tablet Take 1 tablet by mouth daily. With lunch and at night.    . Potassium Citrate 15 MEQ (1620 MG) TBCR Take 2 tablets by mouth 2 (two) times daily.  6  . PRESCRIPTION MEDICATION 1 each by Subconjunctival route every 30 (thirty) days. Allergy shot    . sodium chloride (MURO 128) 5 % ophthalmic solution Place 1 drop into both eyes 2 (two) times daily.    . tamsulosin (FLOMAX) 0.4 MG CAPS capsule Take 0.4 mg by mouth every evening.    . ezetimibe (ZETIA) 10 MG tablet Take 1 tablet (10 mg total) by mouth daily. 90 tablet 3   No current facility-administered medications for this visit.    Allergies:   Dexlansoprazole    ROS:  Please see the history of present illness.   Otherwise, review of systems are positive for none.   All other systems are reviewed and negative.    PHYSICAL EXAM: VS:  BP (!) 150/75    Pulse (!) 58   Ht 5\' 8"  (1.727 m)   Wt 189 lb 6.4 oz (85.9 kg)   SpO2 95%   BMI 28.80 kg/m  , BMI Body mass index is 28.8 kg/m.  GENERAL:  Well appearing NECK:  No jugular venous distention, waveform within normal limits, carotid upstroke brisk and symmetric, no bruits, no thyromegaly LUNGS:  Clear to auscultation bilaterally CHEST:  Unremarkable HEART:  PMI not displaced or sustained,S1 and S2 within normal limits, no S3, no S4, no clicks, no rubs, no murmurs ABD:  Flat, positive bowel sounds normal in frequency in pitch, no bruits, no rebound, no guarding, no midline pulsatile mass, no hepatomegaly, no splenomegaly EXT:  2 plus pulses throughout, no edema, no cyanosis no clubbing    EKG:  EKG is   ordered today. The ekg ordered today demonstrates sinus rhythm, rate 58, axis within normal limits, intervals within normal limits, no acute ST-T wave changes. PVCs.   05/23/2019    Wt Readings from Last 3 Encounters:  05/23/19 189 lb 6.4 oz (85.9 kg)  11/17/17 186 lb (84.4 kg)  12/06/16 185 lb (83.9 kg)    No results found for: CHOL, TRIG, HDL, LDLCALC, LDLDIRECT   Other studies Reviewed: Additional studies/ records that were reviewed today include: Labs Review of the above records demonstrates: NA  ASSESSMENT AND PLAN:   AORTIC ATHEROSCLEROSIS:   The patient does have atherosclerosis in his iliacs and in his aorta from previous CT scan.  He had a nondiagnostic but low risk treadmill test in 2016.  He is quite active and has no symptoms.  Given this I will think further testing is indicated.  However, we spent quite a bit of time discussing risk reduction.   RISK REDUCTION:    I think his LDL needs to be less than 100.  He was 179.  He has been intolerant of statins.  I prescribed Zetia 10 mg daily.  He can get this followed by Marton Redwood, MD  HTN:  The blood pressure is elevated but he says it is not at home but at the same time says that his blood pressure cuff is not  necessarily reliable.  It is a wrist cuff.  I suggested an arm cuff and we talked about the brand.   COVID EDUCATION: He has had this first with his vaccine.  We talked about continued mask wearing after vaccination.  Current medicines are reviewed at length with the patient today.  The patient does not have concerns regarding medicines.  The following changes have been made:  None  Labs/ tests ordered today include:   None  Orders Placed This Encounter  Procedures  . EKG 12-Lead  Disposition:   FU with me in 18 months.    Signed, Minus Breeding, MD  05/23/2019 11:11 AM    [place]

## 2019-05-23 ENCOUNTER — Encounter: Payer: Self-pay | Admitting: Cardiology

## 2019-05-23 ENCOUNTER — Other Ambulatory Visit: Payer: Self-pay

## 2019-05-23 ENCOUNTER — Ambulatory Visit: Payer: PPO | Admitting: Cardiology

## 2019-05-23 VITALS — BP 150/75 | HR 58 | Ht 68.0 in | Wt 189.4 lb

## 2019-05-23 DIAGNOSIS — I10 Essential (primary) hypertension: Secondary | ICD-10-CM

## 2019-05-23 DIAGNOSIS — I700 Atherosclerosis of aorta: Secondary | ICD-10-CM

## 2019-05-23 DIAGNOSIS — Z7189 Other specified counseling: Secondary | ICD-10-CM | POA: Diagnosis not present

## 2019-05-23 MED ORDER — EZETIMIBE 10 MG PO TABS: 10.0000 mg | ORAL_TABLET | Freq: Every day | ORAL | 3 refills | Status: DC

## 2019-05-23 NOTE — Patient Instructions (Signed)
Medication Instructions:  START ZETIA 10MG  DAILY *If you need a refill on your cardiac medications before your next appointment, please call your pharmacy*  Lab Work: NONE  Testing/Procedures: NONE  Follow-Up: At [facility], you and your health needs are our priority.  As part of our continuing mission to provide you with exceptional heart care, we have created designated Provider Care Teams.  These Care Teams include your primary Cardiologist (physician) and Advanced Practice Providers (APPs -  Physician Assistants and Nurse Practitioners) who all work together to provide you with the care you need, when you need it.  Your next appointment:   18 month(s)  The format for your next appointment:   In Person  Provider:   Minus Breeding, MD

## 2019-06-07 DIAGNOSIS — L570 Actinic keratosis: Secondary | ICD-10-CM | POA: Diagnosis not present

## 2019-06-07 DIAGNOSIS — L219 Seborrheic dermatitis, unspecified: Secondary | ICD-10-CM | POA: Diagnosis not present

## 2019-06-07 DIAGNOSIS — Z85828 Personal history of other malignant neoplasm of skin: Secondary | ICD-10-CM | POA: Diagnosis not present

## 2019-06-07 DIAGNOSIS — L821 Other seborrheic keratosis: Secondary | ICD-10-CM | POA: Diagnosis not present

## 2019-06-07 DIAGNOSIS — Z86018 Personal history of other benign neoplasm: Secondary | ICD-10-CM | POA: Diagnosis not present

## 2019-06-07 DIAGNOSIS — D2271 Melanocytic nevi of right lower limb, including hip: Secondary | ICD-10-CM | POA: Diagnosis not present

## 2019-06-07 DIAGNOSIS — D485 Neoplasm of uncertain behavior of skin: Secondary | ICD-10-CM | POA: Diagnosis not present

## 2019-06-07 DIAGNOSIS — D225 Melanocytic nevi of trunk: Secondary | ICD-10-CM | POA: Diagnosis not present

## 2019-06-07 DIAGNOSIS — L578 Other skin changes due to chronic exposure to nonionizing radiation: Secondary | ICD-10-CM | POA: Diagnosis not present

## 2019-06-07 DIAGNOSIS — C44519 Basal cell carcinoma of skin of other part of trunk: Secondary | ICD-10-CM | POA: Diagnosis not present

## 2019-06-14 DIAGNOSIS — J301 Allergic rhinitis due to pollen: Secondary | ICD-10-CM | POA: Diagnosis not present

## 2019-06-14 DIAGNOSIS — J3081 Allergic rhinitis due to animal (cat) (dog) hair and dander: Secondary | ICD-10-CM | POA: Diagnosis not present

## 2019-06-14 DIAGNOSIS — J3089 Other allergic rhinitis: Secondary | ICD-10-CM | POA: Diagnosis not present

## 2019-06-14 DIAGNOSIS — H1045 Other chronic allergic conjunctivitis: Secondary | ICD-10-CM | POA: Diagnosis not present

## 2019-07-01 DIAGNOSIS — J3089 Other allergic rhinitis: Secondary | ICD-10-CM | POA: Diagnosis not present

## 2019-07-01 DIAGNOSIS — J301 Allergic rhinitis due to pollen: Secondary | ICD-10-CM | POA: Diagnosis not present

## 2019-07-04 DIAGNOSIS — C44519 Basal cell carcinoma of skin of other part of trunk: Secondary | ICD-10-CM | POA: Diagnosis not present

## 2019-07-29 DIAGNOSIS — J301 Allergic rhinitis due to pollen: Secondary | ICD-10-CM | POA: Diagnosis not present

## 2019-07-29 DIAGNOSIS — J3089 Other allergic rhinitis: Secondary | ICD-10-CM | POA: Diagnosis not present

## 2019-08-06 DIAGNOSIS — Z8739 Personal history of other diseases of the musculoskeletal system and connective tissue: Secondary | ICD-10-CM | POA: Diagnosis not present

## 2019-08-06 DIAGNOSIS — M545 Low back pain: Secondary | ICD-10-CM | POA: Diagnosis not present

## 2019-08-06 DIAGNOSIS — M5136 Other intervertebral disc degeneration, lumbar region: Secondary | ICD-10-CM | POA: Diagnosis not present

## 2019-08-14 DIAGNOSIS — M545 Low back pain: Secondary | ICD-10-CM | POA: Diagnosis not present

## 2019-08-20 DIAGNOSIS — M545 Low back pain: Secondary | ICD-10-CM | POA: Diagnosis not present

## 2019-08-22 DIAGNOSIS — M545 Low back pain: Secondary | ICD-10-CM | POA: Diagnosis not present

## 2019-08-28 DIAGNOSIS — J3089 Other allergic rhinitis: Secondary | ICD-10-CM | POA: Diagnosis not present

## 2019-08-28 DIAGNOSIS — J301 Allergic rhinitis due to pollen: Secondary | ICD-10-CM | POA: Diagnosis not present

## 2019-08-30 DIAGNOSIS — M5136 Other intervertebral disc degeneration, lumbar region: Secondary | ICD-10-CM | POA: Diagnosis not present

## 2019-09-03 DIAGNOSIS — M5136 Other intervertebral disc degeneration, lumbar region: Secondary | ICD-10-CM | POA: Diagnosis not present

## 2019-09-03 DIAGNOSIS — M519 Unspecified thoracic, thoracolumbar and lumbosacral intervertebral disc disorder: Secondary | ICD-10-CM | POA: Diagnosis not present

## 2019-09-03 DIAGNOSIS — M545 Low back pain: Secondary | ICD-10-CM | POA: Diagnosis not present

## 2019-09-06 DIAGNOSIS — M545 Low back pain: Secondary | ICD-10-CM | POA: Diagnosis not present

## 2019-09-19 DIAGNOSIS — M47896 Other spondylosis, lumbar region: Secondary | ICD-10-CM | POA: Diagnosis not present

## 2019-09-25 DIAGNOSIS — Z125 Encounter for screening for malignant neoplasm of prostate: Secondary | ICD-10-CM | POA: Diagnosis not present

## 2019-09-25 DIAGNOSIS — R7301 Impaired fasting glucose: Secondary | ICD-10-CM | POA: Diagnosis not present

## 2019-09-25 DIAGNOSIS — I10 Essential (primary) hypertension: Secondary | ICD-10-CM | POA: Diagnosis not present

## 2019-10-01 DIAGNOSIS — J301 Allergic rhinitis due to pollen: Secondary | ICD-10-CM | POA: Diagnosis not present

## 2019-10-01 DIAGNOSIS — J3089 Other allergic rhinitis: Secondary | ICD-10-CM | POA: Diagnosis not present

## 2019-10-02 DIAGNOSIS — Z0000 Encounter for general adult medical examination without abnormal findings: Secondary | ICD-10-CM | POA: Diagnosis not present

## 2019-10-02 DIAGNOSIS — M7138 Other bursal cyst, other site: Secondary | ICD-10-CM | POA: Diagnosis not present

## 2019-10-02 DIAGNOSIS — M5416 Radiculopathy, lumbar region: Secondary | ICD-10-CM | POA: Diagnosis not present

## 2019-10-02 DIAGNOSIS — I700 Atherosclerosis of aorta: Secondary | ICD-10-CM | POA: Diagnosis not present

## 2019-10-02 DIAGNOSIS — R82998 Other abnormal findings in urine: Secondary | ICD-10-CM | POA: Diagnosis not present

## 2019-10-02 DIAGNOSIS — R7301 Impaired fasting glucose: Secondary | ICD-10-CM | POA: Diagnosis not present

## 2019-10-02 DIAGNOSIS — Z1331 Encounter for screening for depression: Secondary | ICD-10-CM | POA: Diagnosis not present

## 2019-10-02 DIAGNOSIS — N400 Enlarged prostate without lower urinary tract symptoms: Secondary | ICD-10-CM | POA: Diagnosis not present

## 2019-10-02 DIAGNOSIS — I10 Essential (primary) hypertension: Secondary | ICD-10-CM | POA: Diagnosis not present

## 2019-10-02 DIAGNOSIS — E785 Hyperlipidemia, unspecified: Secondary | ICD-10-CM | POA: Diagnosis not present

## 2019-10-02 DIAGNOSIS — G250 Essential tremor: Secondary | ICD-10-CM | POA: Diagnosis not present

## 2019-10-02 DIAGNOSIS — Z1212 Encounter for screening for malignant neoplasm of rectum: Secondary | ICD-10-CM | POA: Diagnosis not present

## 2019-10-03 DIAGNOSIS — M545 Low back pain: Secondary | ICD-10-CM | POA: Diagnosis not present

## 2019-10-11 DIAGNOSIS — M545 Low back pain: Secondary | ICD-10-CM | POA: Diagnosis not present

## 2019-10-15 DIAGNOSIS — M5136 Other intervertebral disc degeneration, lumbar region: Secondary | ICD-10-CM | POA: Diagnosis not present

## 2019-10-22 DIAGNOSIS — M7138 Other bursal cyst, other site: Secondary | ICD-10-CM | POA: Diagnosis not present

## 2019-10-30 ENCOUNTER — Other Ambulatory Visit: Payer: Self-pay | Admitting: Neurosurgery

## 2019-10-31 ENCOUNTER — Telehealth: Payer: Self-pay | Admitting: *Deleted

## 2019-10-31 NOTE — Telephone Encounter (Signed)
   [facility] Pre-operative Risk Assessment    PRIMARY CARDIOLOGIST - DR [place] for surgical clearance:  1. What type of surgery is being performed? Monday -July 19 , 2021  2. When is this surgery scheduled?  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 What type of clearance is required (medical clearance vs. Pharmacy clearance to hold med vs. Both)?  MEDICAL   4. Are there any medications that need to be held prior to surgery and how long? ASPIRIN 81 MG   5. Practice name and name of physician performing surgery? [facility] ;DR Dominica Severin P. CRAM  6. What is the office phone number? [place].   What is the office fax number? [place]   Anesthesia type (None, local, MAC, general) ? GENERAL   Ross [name] 10/31/2019, 2:24 PM  _________________________________________________________________   (provider comments below)

## 2019-11-01 NOTE — Telephone Encounter (Signed)
   Primary Cardiologist: Minus Breeding, MD  Chart reviewed as part of pre-operative protocol coverage. Patient was contacted 11/01/2019 in reference to pre-operative risk assessment for pending surgery as outlined below.  Ross Level. was last seen on 05/23/2019 by Dr. [name].  Since that day, Ross [name]. has done well without chest pain or shortness of breath. Despite back issue, he is able to accomplish 4 METS of activity without chest pain or significant dyspnea. He does not have h/o CAD, but does have aortic atherosclerosis, will need hold aspirin for 7 days prior to the procedure.   Therefore, based on ACC/AHA guidelines, the patient would be at acceptable risk for the planned procedure without further cardiovascular testing.   I will route this recommendation to the requesting party via Epic fax function and remove from pre-op pool. Please call with questions.  [name], [place] 11/01/2019, 7:33 PM

## 2019-11-04 NOTE — Pre-Procedure Instructions (Addendum)
[id number] - [place], [address] [place] [address] Phone: [phone] Fax: [place] [address], [place] [address] Phone: [phone] Fax: [phone]    Your procedure is scheduled on Mon., November 11, 2019 from 1:47PM-3:26PM  Report to [facility] Entrance "A" at 11:45AM  Call this number if you have problems the morning of surgery:  [phone]   Remember:  Do not eat or drink after midnight on July 18th     Take these medicines the morning of surgery with A SIP OF WATER: Ezetimibe (ZETIA)     Fluticasone (FLONASE) Sodium chloride (MURO 128) eye drops  If Needed: EPINEPHrine  Take last dose of  Aspirin on Mon. 7/12, per Dr. [name]  As of today, STOP taking all Aspirin (unless instructed by your doctor) and Other Aspirin containing products, Vitamins, Fish oils, and Herbal medications. Also stop all NSAIDS i.e. Advil, Ibuprofen, Motrin, Aleve, Anaprox, Naproxen, BC, Goody Powders, and all Supplements.  No Smoking of any kind, Tobacco, or Alcohol products 24 hours prior to your procedure. If you use a Cpap at night, you may bring all equipment for your overnight stay.   Special instructions: [facility]- Preparing For Surgery  Before surgery, you can play an important role. Because skin is not sterile, your skin needs to be as free of germs as possible. You can reduce the number of germs on your skin by washing with CHG (chlorahexidine gluconate) Soap before surgery.  CHG is an antiseptic cleaner which kills germs and bonds with the skin to continue killing germs even after washing.    Please do not use if you have an allergy to CHG or antibacterial soaps. If your skin becomes reddened/irritated stop using the CHG.  Do not shave (including legs and underarms) for at least 48 hours  prior to first CHG shower. It is OK to shave your face.  Please follow these instructions carefully.   1. Shower the NIGHT BEFORE SURGERY and the MORNING OF SURGERY with CHG.   2. If you chose to wash your hair, wash your hair first as usual with your normal shampoo.  3. After you shampoo, rinse your hair and body thoroughly to remove the shampoo.  4. Use CHG as you would any other liquid soap. You can apply CHG directly to the skin and wash gently with a scrungie or a clean washcloth.   5. Apply the CHG Soap to your body ONLY FROM THE NECK DOWN.  Do not use on open wounds or open sores. Avoid contact with your eyes, ears, mouth and genitals (private parts). Wash Face and genitals (private parts)  with your normal soap.  6. Wash thoroughly, paying special attention to the area where your surgery will be performed.  7. Thoroughly rinse your body with warm water from the neck down.  8. DO NOT shower/wash with your normal soap after using and rinsing off the CHG Soap.  9. Pat yourself dry with a CLEAN TOWEL.  10. Wear CLEAN PAJAMAS to bed the night before surgery, wear comfortable clothes the morning of surgery  11. Place CLEAN SHEETS on your bed the night of your first shower and DO NOT SLEEP WITH PETS.   Day of Surgery:           Remember to brush your teeth WITH YOUR REGULAR TOOTHPASTE.  Do  not wear jewelry.  Do not wear lotions, powders, colognes, or deodorant.  Do not shave 48 hours prior to surgery.  Men may shave face and neck.  Do not bring valuables to the hospital.  [facility] is not responsible for any belongings or valuables.  Contacts, dentures or bridgework may not be worn into surgery.    For patients admitted to the hospital, discharge time will be determined by your treatment team.  Patients discharged the day of surgery will not be allowed to drive home, and someone age 58 and over needs to stay with them for 24 hours.  Please wear clean clothes to the  hospital/surgery center.    Please read over the following fact sheets that you were given.

## 2019-11-05 ENCOUNTER — Encounter (HOSPITAL_COMMUNITY)
Admission: RE | Admit: 2019-11-05 | Discharge: 2019-11-05 | Disposition: A | Discharge status: Home / Self Care | Payer: PPO | Source: Ambulatory Visit | Attending: Neurosurgery | Admitting: Neurosurgery

## 2019-11-05 ENCOUNTER — Other Ambulatory Visit: Payer: Self-pay

## 2019-11-05 ENCOUNTER — Encounter (HOSPITAL_COMMUNITY): Payer: Self-pay

## 2019-11-05 DIAGNOSIS — Z87891 Personal history of nicotine dependence: Secondary | ICD-10-CM | POA: Insufficient documentation

## 2019-11-05 DIAGNOSIS — M7138 Other bursal cyst, other site: Secondary | ICD-10-CM | POA: Insufficient documentation

## 2019-11-05 DIAGNOSIS — Z8673 Personal history of transient ischemic attack (TIA), and cerebral infarction without residual deficits: Secondary | ICD-10-CM | POA: Diagnosis not present

## 2019-11-05 DIAGNOSIS — Z7982 Long term (current) use of aspirin: Secondary | ICD-10-CM | POA: Diagnosis not present

## 2019-11-05 DIAGNOSIS — Z01812 Encounter for preprocedural laboratory examination: Secondary | ICD-10-CM | POA: Diagnosis not present

## 2019-11-05 DIAGNOSIS — Z85828 Personal history of other malignant neoplasm of skin: Secondary | ICD-10-CM | POA: Insufficient documentation

## 2019-11-05 DIAGNOSIS — I10 Essential (primary) hypertension: Secondary | ICD-10-CM | POA: Insufficient documentation

## 2019-11-05 DIAGNOSIS — Z79899 Other long term (current) drug therapy: Secondary | ICD-10-CM | POA: Insufficient documentation

## 2019-11-05 HISTORY — DX: Pneumonia, unspecified organism: J18.9

## 2019-11-05 HISTORY — DX: Personal history of urinary calculi: Z87.442

## 2019-11-05 LAB — BASIC METABOLIC PANEL
Anion gap: 10 (ref 5–15)
BUN: 13 mg/dL (ref 8–23)
CO2: 25 mmol/L (ref 22–32)
Calcium: 9 mg/dL (ref 8.9–10.3)
Chloride: 103 mmol/L (ref 98–111)
Creatinine, Ser: 0.93 mg/dL (ref 0.61–1.24)
GFR calc Af Amer: >60 mL/min (ref >60)
GFR calc non Af Amer: >60 mL/min (ref >60)
Glucose, Bld: 117 mg/dL — ABNORMAL HIGH (ref 70–99)
Potassium: 4.1 mmol/L (ref 3.5–5.1)
Sodium: 138 mmol/L (ref 135–145)

## 2019-11-05 LAB — CBC
HCT: 45.6 % (ref 39.0–52.0)
Hemoglobin: 15.7 g/dL (ref 13.0–17.0)
MCH: 32.8 pg (ref 26.0–34.0)
MCHC: 34.4 g/dL (ref 30.0–36.0)
MCV: 95.2 fL (ref 80.0–100.0)
Platelets: 188 10*3/uL (ref 150–400)
RBC: 4.79 MIL/uL (ref 4.22–5.81)
RDW: 13.2 % (ref 11.5–15.5)
WBC: 5.3 10*3/uL (ref 4.0–10.5)
nRBC: 0 % (ref 0.0–0.2)

## 2019-11-05 LAB — SURGICAL PCR SCREEN
MRSA, PCR: NEGATIVE
Staphylococcus aureus: NEGATIVE

## 2019-11-05 NOTE — Progress Notes (Signed)
PCP - Dr. Marton Redwood Cardiologist - Dr. Minus Breeding  PPM/ICD - denies  Chest x-ray - N/A EKG - 05/23/2019 Stress Test - 10/17/14 ECHO - denies Cardiac Cath - denies  Sleep Study - denies CPAP - N/A  DM: denies  Blood Thinner Instructions: N/A Aspirin Instructions: Per patient, last dose was taken on 11/01/2019  ERAS Protcol - No  COVID TEST- Scheduled for 11/08/2019. Patient verbalized understanding of self-quarantine instructions, appointment time and place.  Anesthesia review: YES, cardiac clearance  Patient denies shortness of breath, fever, cough and chest pain at PAT appointment  All instructions explained to the patient, with a verbal understanding of the material. Patient agrees to go over the instructions while at home for a better understanding. Patient also instructed to self quarantine after being tested for COVID-19. The opportunity to ask questions was provided.

## 2019-11-06 NOTE — Progress Notes (Signed)
Anesthesia Chart Review:  Case: [id number] Date/Time: 11/11/19 1332   Procedure: L4-5 Laminectomy for facet/synovial cyst (N/A Back) - 3C   Anesthesia type: General   Pre-op diagnosis: Synovial cyst   Location: [facility] OR ROOM 19 / [place] OR   Surgeons: Ross Kos, MD      DISCUSSION: Patient is a 79 year old male scheduled for the above procedure.  History includes former smoker (quit 09/15/96), HTN, CVA (2005), essential tremor, skin cancer (s/p excision ~ 2005), cholecystectomy (2010), TKA (right 12/06/16).  Preoperative cardiac input outlined on 11/01/19 by Ross Deforest, PA: "Ross [name]. was last seen on 05/23/2019 by Dr. Percival [name].  Since that day, Ross [name]. has done well without chest pain or shortness of breath. Despite back issue, he is able to accomplish 4 METS of activity without chest pain or significant dyspnea. He does not have h/o CAD, but does have aortic atherosclerosis, will need hold aspirin for 7 days prior to the procedure.   Therefore, based on ACC/AHA guidelines, the patient would be at acceptable risk for the planned procedure without further cardiovascular testing."  Last ASA 11/01/19.  Presurgical COVID-19 test is scheduled for 11/09/19. Anesthesia team to evaluate on the day of surgery.   VS: BP (!) 157/70    Pulse 69    Temp 36.7 C (Oral)    Resp 20    Ht 5\' 8"  (1.727 m)    Wt 84.3 kg    SpO2 96%    BMI 28.25 kg/m    PROVIDERS: Ross Redwood, MD is PCP Ross Breeding, MD is cardiologist. Last evaluation 05/23/19 for follow-up aortic atherosclerosis. 18 month follow-up recommended.    LABS: Labs reviewed: Acceptable for surgery. (all labs ordered are listed, but only abnormal results are displayed)  Labs Reviewed  BASIC METABOLIC PANEL - Abnormal; Notable for the following components:      Result Value   Glucose, Bld 117 (*)    All other components within normal limits  SURGICAL PCR SCREEN  CBC    EKG: 05/23/19: SB at 58 bpm   CV: Carotid US  10/09/15: Impressions: Heterogeneous plaque, bilaterally. 1-39% bilateral ICA stenosis. > 50% LECA stenosis. Normal SCA, bilterally. Patent vertebral arteries with antegrade flow.   ETT 10/17/14:  No T wave inversion was noted during stress.  Upsloping ST segment depression ST segment depression was noted during stress in the inferior leads (II, III, aVF and V6), beginning at 5 minutes of stress, and returning to baseline after less than 1 minute of recovery. 0.5 - 1.0 mm upsloping ST depression  Nondiagnostic GXT with the patient exercising to a peak HR of 133 (89%APMHR) and workload of 8.5 mets. Patient developed upsloping  0.5 to 1 mm ST depression during Stage 2 of exercise with exercise induced PAC's without chest pain pain. Duke treadmill score is 1. Clinical follow-up is recommended.  - Per 05/23/19 note by Dr. Percival [name], "Stress perfusion study in 2008 demonstrated no evidence of ischemia. POET (Plain Old Exercise Treadmill) in 2016 had some nonspecific ST changes but no clear evidence of ischemia."   Abdominal aorta US 10/17/14: No evidence of AAA.   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   Essential and other specified forms of tremor 08/30/2013   History of kidney stones    Hypertension    Kidney stones    Pneumonia    Stroke [facility]) 2005    Past Surgical History:  Procedure Laterality Date   APPENDECTOMY     CATARACT EXTRACTION     bilateral   CHOLECYSTECTOMY     CYSTOSCOPY WITH RETROGRADE PYELOGRAM, URETEROSCOPY AND STENT PLACEMENT Left 06/11/2013   Procedure: CYSTOSCOPY WITH RETROGRADE Left PYELOGRAM, URETEROSCOPY AND Left STENT PLACEMENT;  Surgeon: Ross Rud, MD;  Location: [facility] ORS;  Service: Urology;  Laterality: Left;   KNEE SURGERY     bilateral arthroscopic   SHOULDER SURGERY     right    TOTAL KNEE ARTHROPLASTY Right 12/06/2016   Procedure: RIGHT TOTAL KNEE ARTHROPLASTY;  Surgeon: Ross Cancel, MD;   Location: [facility] ORS;  Service: Orthopedics;  Laterality: Right;  70 mins    MEDICATIONS:  aspirin 81 MG chewable tablet   b complex vitamins tablet   cetirizine (ZYRTEC) 10 MG tablet   Cholecalciferol (VITAMIN D) 2000 units CAPS   EPINEPHrine 0.3 mg/0.3 mL IJ SOAJ injection   ezetimibe (ZETIA) 10 MG tablet   fluticasone (FLONASE) 50 MCG/ACT nasal spray   hydrocortisone cream 1 %   ketoconazole (NIZORAL) 2 % cream   lisinopril-hydrochlorothiazide (ZESTORETIC) 20-12.5 MG tablet   Multiple Vitamin (MULTIVITAMIN) tablet   Multiple Vitamins-Minerals (PRESERVISION AREDS 2 PO)   potassium chloride SA (KLOR-CON M15) 15 MEQ tablet   Potassium Citrate 15 MEQ (1620 MG) TBCR   PRESCRIPTION MEDICATION   psyllium (METAMUCIL) 58.6 % packet   sodium chloride (MURO 128) 5 % ophthalmic solution   tamsulosin (FLOMAX) 0.4 MG CAPS capsule   No current facility-administered medications for this encounter.    Ross Gianotti, PA-C Surgical Short Stay/Anesthesiology [facility] Phone [phone] [facility] Phone [phone] 11/06/2019 11:02 AM

## 2019-11-06 NOTE — Anesthesia Preprocedure Evaluation (Addendum)
Anesthesia Evaluation  Patient identified by MRN, date of birth, ID band Patient awake    Reviewed: Allergy & Precautions, H&P , NPO status , Patient's Chart, lab work & pertinent test results  Airway Mallampati: III  TM Distance: >3 FB Neck ROM: Full    Dental no notable dental hx. (+) Teeth Intact, Dental Advisory Given   Pulmonary neg pulmonary ROS, former smoker,    Pulmonary exam normal breath sounds clear to auscultation       Cardiovascular hypertension, Pt. on medications  Rhythm:Regular Rate:Normal     Neuro/Psych CVA, Residual Symptoms negative psych ROS   GI/Hepatic negative GI ROS, Neg liver ROS,   Endo/Other  negative endocrine ROS  Renal/GU Renal disease  negative genitourinary   Musculoskeletal   Abdominal   Peds  Hematology negative hematology ROS (+)   Anesthesia Other Findings   Reproductive/Obstetrics negative OB ROS                           Anesthesia Physical Anesthesia Plan  ASA: III  Anesthesia Plan: General   Post-op Pain Management:    Induction: Intravenous  PONV Risk Score and Plan: 3 and Ondansetron, Dexamethasone and Treatment may vary due to age or medical condition  Airway Management Planned: Oral ETT  Additional Equipment:   Intra-op Plan:   Post-operative Plan: Extubation in OR  Informed Consent: I have reviewed the patients History and Physical, chart, labs and discussed the procedure including the risks, benefits and alternatives for the proposed anesthesia with the patient or authorized representative who has indicated his/her understanding and acceptance.     Dental advisory given  Plan Discussed with: CRNA  Anesthesia Plan Comments: (PAT note written 11/06/2019 by Myra Gianotti, PA-C. )       Anesthesia Quick Evaluation

## 2019-11-08 ENCOUNTER — Other Ambulatory Visit (HOSPITAL_COMMUNITY)
Admission: RE | Admit: 2019-11-08 | Discharge: 2019-11-08 | Disposition: A | Discharge status: Home / Self Care | Payer: PPO | Source: Ambulatory Visit | Attending: Neurosurgery | Admitting: Neurosurgery

## 2019-11-08 DIAGNOSIS — Z01812 Encounter for preprocedural laboratory examination: Secondary | ICD-10-CM | POA: Diagnosis not present

## 2019-11-08 DIAGNOSIS — Z20822 Contact with and (suspected) exposure to covid-19: Secondary | ICD-10-CM | POA: Insufficient documentation

## 2019-11-09 ENCOUNTER — Other Ambulatory Visit (HOSPITAL_COMMUNITY): Payer: PPO

## 2019-11-09 LAB — SARS CORONAVIRUS 2 (TAT 6-24 HRS): SARS Coronavirus 2: NEGATIVE

## 2019-11-11 ENCOUNTER — Ambulatory Visit (HOSPITAL_COMMUNITY): Payer: PPO | Admitting: Vascular Surgery

## 2019-11-11 ENCOUNTER — Ambulatory Visit (HOSPITAL_COMMUNITY)
Admission: RE | Admit: 2019-11-11 | Discharge: 2019-11-12 | Disposition: A | Discharge status: Home / Self Care | Payer: PPO | Attending: Neurosurgery | Admitting: Neurosurgery

## 2019-11-11 ENCOUNTER — Encounter (HOSPITAL_COMMUNITY)
Admission: RE | Disposition: A | Discharge status: Home / Self Care | Payer: Self-pay | Source: Home / Self Care | Attending: Neurosurgery

## 2019-11-11 ENCOUNTER — Encounter (HOSPITAL_COMMUNITY): Payer: Self-pay | Admitting: Neurosurgery

## 2019-11-11 ENCOUNTER — Ambulatory Visit (HOSPITAL_COMMUNITY): Payer: PPO | Admitting: Certified Registered Nurse Anesthetist

## 2019-11-11 ENCOUNTER — Ambulatory Visit (HOSPITAL_COMMUNITY): Payer: PPO

## 2019-11-11 ENCOUNTER — Other Ambulatory Visit: Payer: Self-pay

## 2019-11-11 DIAGNOSIS — Z888 Allergy status to other drugs, medicaments and biological substances status: Secondary | ICD-10-CM | POA: Diagnosis not present

## 2019-11-11 DIAGNOSIS — Z823 Family history of stroke: Secondary | ICD-10-CM | POA: Insufficient documentation

## 2019-11-11 DIAGNOSIS — Z8249 Family history of ischemic heart disease and other diseases of the circulatory system: Secondary | ICD-10-CM | POA: Insufficient documentation

## 2019-11-11 DIAGNOSIS — Z8269 Family history of other diseases of the musculoskeletal system and connective tissue: Secondary | ICD-10-CM | POA: Insufficient documentation

## 2019-11-11 DIAGNOSIS — Z7982 Long term (current) use of aspirin: Secondary | ICD-10-CM | POA: Diagnosis not present

## 2019-11-11 DIAGNOSIS — Z800 Family history of malignant neoplasm of digestive organs: Secondary | ICD-10-CM | POA: Diagnosis not present

## 2019-11-11 DIAGNOSIS — Z87442 Personal history of urinary calculi: Secondary | ICD-10-CM | POA: Diagnosis not present

## 2019-11-11 DIAGNOSIS — R251 Tremor, unspecified: Secondary | ICD-10-CM | POA: Diagnosis not present

## 2019-11-11 DIAGNOSIS — I10 Essential (primary) hypertension: Secondary | ICD-10-CM | POA: Insufficient documentation

## 2019-11-11 DIAGNOSIS — Z87891 Personal history of nicotine dependence: Secondary | ICD-10-CM | POA: Insufficient documentation

## 2019-11-11 DIAGNOSIS — Z981 Arthrodesis status: Secondary | ICD-10-CM | POA: Diagnosis not present

## 2019-11-11 DIAGNOSIS — M5136 Other intervertebral disc degeneration, lumbar region: Secondary | ICD-10-CM | POA: Diagnosis not present

## 2019-11-11 DIAGNOSIS — M7138 Other bursal cyst, other site: Secondary | ICD-10-CM | POA: Diagnosis present

## 2019-11-11 DIAGNOSIS — Z96653 Presence of artificial knee joint, bilateral: Secondary | ICD-10-CM | POA: Insufficient documentation

## 2019-11-11 DIAGNOSIS — Z9842 Cataract extraction status, left eye: Secondary | ICD-10-CM | POA: Insufficient documentation

## 2019-11-11 DIAGNOSIS — I700 Atherosclerosis of aorta: Secondary | ICD-10-CM | POA: Diagnosis not present

## 2019-11-11 DIAGNOSIS — M5117 Intervertebral disc disorders with radiculopathy, lumbosacral region: Secondary | ICD-10-CM | POA: Insufficient documentation

## 2019-11-11 DIAGNOSIS — Z807 Family history of other malignant neoplasms of lymphoid, hematopoietic and related tissues: Secondary | ICD-10-CM | POA: Insufficient documentation

## 2019-11-11 DIAGNOSIS — Z419 Encounter for procedure for purposes other than remedying health state, unspecified: Secondary | ICD-10-CM

## 2019-11-11 DIAGNOSIS — M5416 Radiculopathy, lumbar region: Secondary | ICD-10-CM | POA: Diagnosis not present

## 2019-11-11 DIAGNOSIS — I6930 Unspecified sequelae of cerebral infarction: Secondary | ICD-10-CM | POA: Insufficient documentation

## 2019-11-11 DIAGNOSIS — M48061 Spinal stenosis, lumbar region without neurogenic claudication: Secondary | ICD-10-CM | POA: Diagnosis not present

## 2019-11-11 DIAGNOSIS — I639 Cerebral infarction, unspecified: Secondary | ICD-10-CM | POA: Diagnosis not present

## 2019-11-11 DIAGNOSIS — Z9841 Cataract extraction status, right eye: Secondary | ICD-10-CM | POA: Insufficient documentation

## 2019-11-11 DIAGNOSIS — Z79899 Other long term (current) drug therapy: Secondary | ICD-10-CM | POA: Diagnosis not present

## 2019-11-11 DIAGNOSIS — Z9049 Acquired absence of other specified parts of digestive tract: Secondary | ICD-10-CM | POA: Insufficient documentation

## 2019-11-11 DIAGNOSIS — M5137 Other intervertebral disc degeneration, lumbosacral region: Secondary | ICD-10-CM | POA: Diagnosis not present

## 2019-11-11 HISTORY — PX: LUMBAR LAMINECTOMY/DECOMPRESSION MICRODISCECTOMY: SHX5026

## 2019-11-11 SURGERY — LUMBAR LAMINECTOMY/DECOMPRESSION MICRODISCECTOMY 1 LEVEL
Anesthesia: General | Site: Back

## 2019-11-11 MED ORDER — FENTANYL CITRATE (PF) 250 MCG/5ML IJ SOLN
INTRAMUSCULAR | Status: AC
  Filled 2019-11-11: qty 5

## 2019-11-11 MED ORDER — B COMPLEX PO TABS: 1.0000 | ORAL_TABLET | Freq: Every day | ORAL | Status: DC

## 2019-11-11 MED ORDER — ALUM & MAG HYDROXIDE-SIMETH 200-200-20 MG/5ML PO SUSP
30.0000 mL | Freq: Four times a day (QID) | ORAL | Status: DC | PRN
  Administered 2019-11-12: 30 mL via ORAL
  Filled 2019-11-11: qty 30

## 2019-11-11 MED ORDER — 0.9 % SODIUM CHLORIDE (POUR BTL) OPTIME
TOPICAL | Status: DC | PRN
  Administered 2019-11-11: 1000 mL

## 2019-11-11 MED ORDER — SODIUM CHLORIDE (HYPERTONIC) 5 % OP SOLN
1.0000 [drp] | Freq: Two times a day (BID) | OPHTHALMIC | Status: DC
  Filled 2019-11-11: qty 15

## 2019-11-11 MED ORDER — CHLORHEXIDINE GLUCONATE 0.12 % MT SOLN
OROMUCOSAL | Status: AC
  Filled 2019-11-11: qty 15

## 2019-11-11 MED ORDER — LIDOCAINE-EPINEPHRINE 1 %-1:100000 IJ SOLN
INTRAMUSCULAR | Status: DC | PRN
  Administered 2019-11-11: 10 mL

## 2019-11-11 MED ORDER — LIDOCAINE 2% (20 MG/ML) 5 ML SYRINGE
INTRAMUSCULAR | Status: DC | PRN
  Administered 2019-11-11: 60 mg via INTRAVENOUS

## 2019-11-11 MED ORDER — CHLORHEXIDINE GLUCONATE CLOTH 2 % EX PADS: 6.0000 | MEDICATED_PAD | Freq: Once | CUTANEOUS | Status: DC

## 2019-11-11 MED ORDER — THROMBIN 5000 UNITS EX SOLR
CUTANEOUS | Status: AC
  Filled 2019-11-11: qty 10000

## 2019-11-11 MED ORDER — EPINEPHRINE 0.3 MG/0.3ML IJ SOAJ
0.3000 mg | INTRAMUSCULAR | Status: DC | PRN
  Filled 2019-11-11: qty 0.6

## 2019-11-11 MED ORDER — ASPIRIN 81 MG PO CHEW
81.0000 mg | CHEWABLE_TABLET | Freq: Every day | ORAL | Status: DC
  Administered 2019-11-11: 81 mg via ORAL
  Filled 2019-11-11: qty 1

## 2019-11-11 MED ORDER — ROCURONIUM BROMIDE 10 MG/ML (PF) SYRINGE
PREFILLED_SYRINGE | INTRAVENOUS | Status: DC | PRN
  Administered 2019-11-11: 60 mg via INTRAVENOUS

## 2019-11-11 MED ORDER — PROPOFOL 10 MG/ML IV BOLUS
INTRAVENOUS | Status: DC | PRN
  Administered 2019-11-11: 120 mg via INTRAVENOUS

## 2019-11-11 MED ORDER — CEFAZOLIN SODIUM-DEXTROSE 2-4 GM/100ML-% IV SOLN
2.0000 g | Freq: Three times a day (TID) | INTRAVENOUS | Status: AC
  Administered 2019-11-11 – 2019-11-12 (×2): 2 g via INTRAVENOUS
  Filled 2019-11-11 (×2): qty 100

## 2019-11-11 MED ORDER — CYCLOBENZAPRINE HCL 10 MG PO TABS
10.0000 mg | ORAL_TABLET | Freq: Three times a day (TID) | ORAL | Status: DC | PRN
  Administered 2019-11-11 (×2): 10 mg via ORAL
  Filled 2019-11-11 (×2): qty 1

## 2019-11-11 MED ORDER — SODIUM CHLORIDE 0.9% FLUSH: 3.0000 mL | INTRAVENOUS | Status: DC | PRN

## 2019-11-11 MED ORDER — SODIUM CHLORIDE 0.9 % IV SOLN: 250.0000 mL | INTRAVENOUS | Status: DC

## 2019-11-11 MED ORDER — B COMPLEX-C PO TABS
1.0000 | ORAL_TABLET | Freq: Every day | ORAL | Status: DC
  Administered 2019-11-11: 1 via ORAL
  Filled 2019-11-11 (×2): qty 1

## 2019-11-11 MED ORDER — THROMBIN 5000 UNITS EX SOLR
CUTANEOUS | Status: DC | PRN
  Administered 2019-11-11 (×2): 5000 [IU] via TOPICAL

## 2019-11-11 MED ORDER — BUPIVACAINE HCL (PF) 0.25 % IJ SOLN
INTRAMUSCULAR | Status: AC
  Filled 2019-11-11: qty 30

## 2019-11-11 MED ORDER — LIDOCAINE 2% (20 MG/ML) 5 ML SYRINGE
INTRAMUSCULAR | Status: AC
  Filled 2019-11-11: qty 5

## 2019-11-11 MED ORDER — DEXAMETHASONE SODIUM PHOSPHATE 10 MG/ML IJ SOLN
10.0000 mg | Freq: Once | INTRAMUSCULAR | Status: AC
  Administered 2019-11-11: 8 mg via INTRAVENOUS

## 2019-11-11 MED ORDER — ACETAMINOPHEN 500 MG PO TABS
ORAL_TABLET | ORAL | Status: AC
  Administered 2019-11-11: 1000 mg via ORAL
  Filled 2019-11-11: qty 2

## 2019-11-11 MED ORDER — ONDANSETRON HCL 4 MG/2ML IJ SOLN: 4.0000 mg | Freq: Four times a day (QID) | INTRAMUSCULAR | Status: DC | PRN

## 2019-11-11 MED ORDER — FENTANYL CITRATE (PF) 100 MCG/2ML IJ SOLN
INTRAMUSCULAR | Status: DC | PRN
  Administered 2019-11-11 (×3): 50 ug via INTRAVENOUS

## 2019-11-11 MED ORDER — ONDANSETRON HCL 4 MG PO TABS: 4.0000 mg | ORAL_TABLET | Freq: Four times a day (QID) | ORAL | Status: DC | PRN

## 2019-11-11 MED ORDER — LACTATED RINGERS IV SOLN: INTRAVENOUS | Status: DC

## 2019-11-11 MED ORDER — PANTOPRAZOLE SODIUM 40 MG PO TBEC
40.0000 mg | DELAYED_RELEASE_TABLET | Freq: Every day | ORAL | Status: DC
  Administered 2019-11-11: 40 mg via ORAL
  Filled 2019-11-11: qty 1

## 2019-11-11 MED ORDER — PROPOFOL 10 MG/ML IV BOLUS
INTRAVENOUS | Status: AC
  Filled 2019-11-11: qty 20

## 2019-11-11 MED ORDER — LISINOPRIL 20 MG PO TABS
20.0000 mg | ORAL_TABLET | Freq: Every day | ORAL | Status: DC
  Filled 2019-11-11: qty 1

## 2019-11-11 MED ORDER — EPHEDRINE SULFATE 50 MG/ML IJ SOLN
INTRAMUSCULAR | Status: DC | PRN
Start: 2019-11-11 — End: 2019-11-11
  Administered 2019-11-11: 5 mg via INTRAVENOUS

## 2019-11-11 MED ORDER — SODIUM CHLORIDE 0.9 % IV SOLN
INTRAVENOUS | Status: DC | PRN
  Administered 2019-11-11: 500 mL

## 2019-11-11 MED ORDER — EZETIMIBE 10 MG PO TABS
10.0000 mg | ORAL_TABLET | Freq: Every day | ORAL | Status: DC
  Administered 2019-11-11: 10 mg via ORAL
  Filled 2019-11-11 (×2): qty 1

## 2019-11-11 MED ORDER — ONDANSETRON HCL 4 MG/2ML IJ SOLN
INTRAMUSCULAR | Status: DC | PRN
  Administered 2019-11-11: 4 mg via INTRAVENOUS

## 2019-11-11 MED ORDER — HYDROCORTISONE 1 % EX CREA
1.0000 "application " | TOPICAL_CREAM | Freq: Every day | CUTANEOUS | Status: DC | PRN
  Filled 2019-11-11: qty 28

## 2019-11-11 MED ORDER — VITAMIN D 25 MCG (1000 UNIT) PO TABS
2000.0000 [IU] | ORAL_TABLET | Freq: Every day | ORAL | Status: DC
  Administered 2019-11-11: 2000 [IU] via ORAL
  Filled 2019-11-11: qty 2

## 2019-11-11 MED ORDER — ORAL CARE MOUTH RINSE
15.0000 mL | Freq: Once | OROMUCOSAL | Status: AC
  Administered 2019-11-11: 15 mL via OROMUCOSAL

## 2019-11-11 MED ORDER — FENTANYL CITRATE (PF) 100 MCG/2ML IJ SOLN
INTRAMUSCULAR | Status: AC
  Filled 2019-11-11: qty 2

## 2019-11-11 MED ORDER — MENTHOL 3 MG MT LOZG
1.0000 | LOZENGE | OROMUCOSAL | Status: DC | PRN
  Filled 2019-11-11: qty 9

## 2019-11-11 MED ORDER — KETOCONAZOLE 2 % EX CREA
1.0000 "application " | TOPICAL_CREAM | Freq: Every day | CUTANEOUS | Status: DC | PRN
  Filled 2019-11-11: qty 15

## 2019-11-11 MED ORDER — ONDANSETRON HCL 4 MG/2ML IJ SOLN
INTRAMUSCULAR | Status: AC
  Filled 2019-11-11: qty 2

## 2019-11-11 MED ORDER — BUPIVACAINE HCL (PF) 0.25 % IJ SOLN
INTRAMUSCULAR | Status: DC | PRN
  Administered 2019-11-11: 10 mL

## 2019-11-11 MED ORDER — PSYLLIUM 95 % PO PACK
1.0000 | PACK | Freq: Every day | ORAL | Status: DC
  Administered 2019-11-11: 1 via ORAL
  Filled 2019-11-11 (×2): qty 1

## 2019-11-11 MED ORDER — HYDROMORPHONE HCL 1 MG/ML IJ SOLN
0.5000 mg | INTRAMUSCULAR | Status: DC | PRN
  Administered 2019-11-11: 0.5 mg via INTRAVENOUS
  Filled 2019-11-11: qty 0.5

## 2019-11-11 MED ORDER — PHENYLEPHRINE 40 MCG/ML (10ML) SYRINGE FOR IV PUSH (FOR BLOOD PRESSURE SUPPORT)
PREFILLED_SYRINGE | INTRAVENOUS | Status: AC
  Filled 2019-11-11: qty 10

## 2019-11-11 MED ORDER — ROCURONIUM BROMIDE 10 MG/ML (PF) SYRINGE
PREFILLED_SYRINGE | INTRAVENOUS | Status: AC
  Filled 2019-11-11: qty 10

## 2019-11-11 MED ORDER — HYDROCHLOROTHIAZIDE 12.5 MG PO CAPS
12.5000 mg | ORAL_CAPSULE | Freq: Every day | ORAL | Status: DC
  Administered 2019-11-11: 12.5 mg via ORAL
  Filled 2019-11-11: qty 1

## 2019-11-11 MED ORDER — PHENYLEPHRINE HCL-NACL 10-0.9 MG/250ML-% IV SOLN
INTRAVENOUS | Status: DC | PRN
  Administered 2019-11-11: 50 ug/min via INTRAVENOUS

## 2019-11-11 MED ORDER — FLUTICASONE PROPIONATE 50 MCG/ACT NA SUSP
1.0000 | Freq: Every day | NASAL | Status: DC
  Filled 2019-11-11: qty 16

## 2019-11-11 MED ORDER — ACETAMINOPHEN 325 MG PO TABS
650.0000 mg | ORAL_TABLET | ORAL | Status: DC | PRN
  Administered 2019-11-11: 650 mg via ORAL
  Filled 2019-11-11: qty 2

## 2019-11-11 MED ORDER — FENTANYL CITRATE (PF) 100 MCG/2ML IJ SOLN
25.0000 ug | INTRAMUSCULAR | Status: DC | PRN
  Administered 2019-11-11 (×2): 50 ug via INTRAVENOUS

## 2019-11-11 MED ORDER — ACETAMINOPHEN 500 MG PO TABS: 1000.0000 mg | ORAL_TABLET | Freq: Once | ORAL | Status: AC

## 2019-11-11 MED ORDER — DEXAMETHASONE SODIUM PHOSPHATE 10 MG/ML IJ SOLN
INTRAMUSCULAR | Status: AC
  Filled 2019-11-11: qty 1

## 2019-11-11 MED ORDER — LISINOPRIL-HYDROCHLOROTHIAZIDE 20-12.5 MG PO TABS: 1.0000 | ORAL_TABLET | Freq: Every day | ORAL | Status: DC

## 2019-11-11 MED ORDER — CHLORHEXIDINE GLUCONATE 0.12 % MT SOLN: 15.0000 mL | Freq: Once | OROMUCOSAL | Status: AC

## 2019-11-11 MED ORDER — CEFAZOLIN SODIUM-DEXTROSE 2-4 GM/100ML-% IV SOLN
2.0000 g | INTRAVENOUS | Status: AC
  Administered 2019-11-11: 2 g via INTRAVENOUS

## 2019-11-11 MED ORDER — PANTOPRAZOLE SODIUM 40 MG IV SOLR: 40.0000 mg | Freq: Every day | INTRAVENOUS | Status: DC

## 2019-11-11 MED ORDER — PHENOL 1.4 % MT LIQD: 1.0000 | OROMUCOSAL | Status: DC | PRN

## 2019-11-11 MED ORDER — HEMOSTATIC AGENTS (NO CHARGE) OPTIME
TOPICAL | Status: DC | PRN
  Administered 2019-11-11: 1 via TOPICAL

## 2019-11-11 MED ORDER — TAMSULOSIN HCL 0.4 MG PO CAPS
0.4000 mg | ORAL_CAPSULE | Freq: Every evening | ORAL | Status: DC
  Administered 2019-11-11: 0.4 mg via ORAL
  Filled 2019-11-11: qty 1

## 2019-11-11 MED ORDER — CEFAZOLIN SODIUM-DEXTROSE 2-4 GM/100ML-% IV SOLN
INTRAVENOUS | Status: AC
  Filled 2019-11-11: qty 100

## 2019-11-11 MED ORDER — SODIUM CHLORIDE 0.9% FLUSH: 3.0000 mL | Freq: Two times a day (BID) | INTRAVENOUS | Status: DC

## 2019-11-11 MED ORDER — ADULT MULTIVITAMIN W/MINERALS CH
1.0000 | ORAL_TABLET | Freq: Every day | ORAL | Status: DC
  Administered 2019-11-11: 1 via ORAL
  Filled 2019-11-11 (×2): qty 1

## 2019-11-11 MED ORDER — OXYCODONE HCL 5 MG PO TABS
10.0000 mg | ORAL_TABLET | ORAL | Status: DC | PRN
  Administered 2019-11-11 (×3): 10 mg via ORAL
  Filled 2019-11-11 (×3): qty 2

## 2019-11-11 MED ORDER — ACETAMINOPHEN 650 MG RE SUPP: 650.0000 mg | RECTAL | Status: DC | PRN

## 2019-11-11 MED ORDER — LIDOCAINE-EPINEPHRINE 1 %-1:100000 IJ SOLN
INTRAMUSCULAR | Status: AC
  Filled 2019-11-11: qty 1

## 2019-11-11 SURGICAL SUPPLY — 67 items
ADH SKN CLS APL DERMABOND .7 (GAUZE/BANDAGES/DRESSINGS) ×1
APL SKNCLS STERI-STRIP NONHPOA (GAUZE/BANDAGES/DRESSINGS) ×1
BAG DECANTER FOR FLEXI CONT (MISCELLANEOUS) ×3 IMPLANT
BAND INSRT 18 STRL LF DISP RB (MISCELLANEOUS) ×2
BAND RUBBER #18 3X1/16 STRL (MISCELLANEOUS) ×6 IMPLANT
BENZOIN TINCTURE PRP APPL 2/3 (GAUZE/BANDAGES/DRESSINGS) ×3 IMPLANT
BLADE CLIPPER SURG (BLADE) IMPLANT
BLADE SURG 11 STRL SS (BLADE) ×3 IMPLANT
BUR CUTTER 7.0 ROUND (BURR) ×3 IMPLANT
BUR MATCHSTICK NEURO 3.0 LAGG (BURR) ×3 IMPLANT
CANISTER SUCT 3000ML PPV (MISCELLANEOUS) ×3 IMPLANT
CARTRIDGE OIL MAESTRO DRILL (MISCELLANEOUS) ×1 IMPLANT
CLOSURE STERI-STRIP 1/2X4 (GAUZE/BANDAGES/DRESSINGS) ×1
CLOSURE WOUND 1/2 X4 (GAUZE/BANDAGES/DRESSINGS) ×1
CLSR STERI-STRIP ANTIMIC 1/2X4 (GAUZE/BANDAGES/DRESSINGS) ×1 IMPLANT
COVER WAND RF STERILE (DRAPES) ×1 IMPLANT
DECANTER SPIKE VIAL GLASS SM (MISCELLANEOUS) ×3 IMPLANT
DERMABOND ADVANCED (GAUZE/BANDAGES/DRESSINGS) ×2
DERMABOND ADVANCED .7 DNX12 (GAUZE/BANDAGES/DRESSINGS) ×1 IMPLANT
DIFFUSER DRILL AIR PNEUMATIC (MISCELLANEOUS) ×3 IMPLANT
DRAPE HALF SHEET 40X57 (DRAPES) IMPLANT
DRAPE LAPAROTOMY 100X72X124 (DRAPES) ×3 IMPLANT
DRAPE MICROSCOPE LEICA (MISCELLANEOUS) ×3 IMPLANT
DRAPE SURG 17X23 STRL (DRAPES) ×3 IMPLANT
DRSG OPSITE POSTOP 4X6 (GAUZE/BANDAGES/DRESSINGS) ×2 IMPLANT
DURAPREP 26ML APPLICATOR (WOUND CARE) ×3 IMPLANT
ELECT BLADE 4.0 EZ CLEAN MEGAD (MISCELLANEOUS) ×3
ELECT REM PT RETURN 9FT ADLT (ELECTROSURGICAL) ×3
ELECTRODE BLDE 4.0 EZ CLN MEGD (MISCELLANEOUS) IMPLANT
ELECTRODE REM PT RTRN 9FT ADLT (ELECTROSURGICAL) ×1 IMPLANT
GAUZE 4X4 16PLY RFD (DISPOSABLE) IMPLANT
GAUZE SPONGE 4X4 12PLY STRL (GAUZE/BANDAGES/DRESSINGS) ×1 IMPLANT
GLOVE BIO SURGEON STRL SZ 6.5 (GLOVE) ×4 IMPLANT
GLOVE BIO SURGEON STRL SZ7 (GLOVE) IMPLANT
GLOVE BIO SURGEON STRL SZ7.5 (GLOVE) ×2 IMPLANT
GLOVE BIO SURGEON STRL SZ8 (GLOVE) ×3 IMPLANT
GLOVE BIO SURGEONS STRL SZ 6.5 (GLOVE) ×4
GLOVE BIOGEL PI IND STRL 6.5 (GLOVE) IMPLANT
GLOVE BIOGEL PI IND STRL 7.0 (GLOVE) IMPLANT
GLOVE BIOGEL PI IND STRL 7.5 (GLOVE) IMPLANT
GLOVE BIOGEL PI INDICATOR 6.5 (GLOVE) ×6
GLOVE BIOGEL PI INDICATOR 7.0 (GLOVE)
GLOVE BIOGEL PI INDICATOR 7.5 (GLOVE) ×2
GLOVE EXAM NITRILE XL STR (GLOVE) IMPLANT
GLOVE INDICATOR 8.5 STRL (GLOVE) ×3 IMPLANT
GOWN STRL REUS W/ TWL LRG LVL3 (GOWN DISPOSABLE) ×1 IMPLANT
GOWN STRL REUS W/ TWL XL LVL3 (GOWN DISPOSABLE) ×2 IMPLANT
GOWN STRL REUS W/TWL 2XL LVL3 (GOWN DISPOSABLE) IMPLANT
GOWN STRL REUS W/TWL LRG LVL3 (GOWN DISPOSABLE) ×6
GOWN STRL REUS W/TWL XL LVL3 (GOWN DISPOSABLE) ×3
KIT BASIN OR (CUSTOM PROCEDURE TRAY) ×3 IMPLANT
KIT TURNOVER KIT B (KITS) ×3 IMPLANT
NDL SPNL 22GX3.5 QUINCKE BK (NEEDLE) ×1 IMPLANT
NEEDLE HYPO 22GX1.5 SAFETY (NEEDLE) ×3 IMPLANT
NEEDLE SPNL 22GX3.5 QUINCKE BK (NEEDLE) ×3 IMPLANT
NS IRRIG 1000ML POUR BTL (IV SOLUTION) ×3 IMPLANT
OIL CARTRIDGE MAESTRO DRILL (MISCELLANEOUS) ×3
PACK LAMINECTOMY NEURO (CUSTOM PROCEDURE TRAY) ×3 IMPLANT
SPONGE SURGIFOAM ABS GEL SZ50 (HEMOSTASIS) ×3 IMPLANT
STRIP CLOSURE SKIN 1/2X4 (GAUZE/BANDAGES/DRESSINGS) ×2 IMPLANT
SUT VIC AB 0 CT1 18XCR BRD8 (SUTURE) ×1 IMPLANT
SUT VIC AB 0 CT1 8-18 (SUTURE) ×3
SUT VIC AB 2-0 CT1 18 (SUTURE) ×3 IMPLANT
SUT VICRYL 4-0 PS2 18IN ABS (SUTURE) ×3 IMPLANT
TOWEL GREEN STERILE (TOWEL DISPOSABLE) ×3 IMPLANT
TOWEL GREEN STERILE FF (TOWEL DISPOSABLE) ×3 IMPLANT
WATER STERILE IRR 1000ML POUR (IV SOLUTION) ×3 IMPLANT

## 2019-11-11 NOTE — Anesthesia Postprocedure Evaluation (Signed)
Anesthesia Post Note  Patient: Labrian Torregrossa.  Procedure(s) Performed: Lumbar four-five Laminectomy for facet/synovial cyst (N/A Back)     Patient location during evaluation: PACU Anesthesia Type: General Level of consciousness: awake and alert Pain management: pain level controlled Vital Signs Assessment: post-procedure vital signs reviewed and stable Respiratory status: spontaneous breathing, nonlabored ventilation and respiratory function stable Cardiovascular status: blood pressure returned to baseline and stable Postop Assessment: no apparent nausea or vomiting Anesthetic complications: no   No complications documented.  Last Vitals:  Vitals:   11/11/19 1402 11/11/19 1435  BP: (!) 149/63 (!) 164/74  Pulse: (!) 53 63  Resp: (!) 9 18  Temp:  36.4 C  SpO2: 95% 97%    Last Pain:  Vitals:   11/11/19 1435  TempSrc: Oral  PainSc:                  Ayrabella Labombard,W. EDMOND

## 2019-11-11 NOTE — Op Note (Signed)
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  Due to patient failed conservative treatment imaging findings and progression of clinical syndrome I recommended laminectomy for resection I extensively went over the risks and benefits of the operation with him as well as perioperative course expectations of outcome and alternatives of surgery and he understood and agreed to proceed forward.  Operative procedure: Patient brought into the OR was due to general anesthesia positioned prone the Wilson frame his back was prepped and draped in routine sterile fashion.  Preoperative x-ray localized the appropriate level so after infiltration of 10 cc lidocaine with epi midline incision was made and Bovie electrocautery was used take down subtenons tissue and subperiosteal dissection was carried out the lamina of L4 and L5 on the right.  Intraoperative x-ray confirmed indication appropriate level.  High-speed drill was used to drill down the inferior lamina of L4 medial facet complex superior aspect lamina of L5 laminotomy was begun with a 2 and 3 Miller Kerrison punch.  Ligament flow was identified and removed in piecemeal fashion exposing thecal sac.  At this point the operating microscope was draped and brought into the field and under microscopic lamination further under biting medial gutter allowed identification of the L5 nerve root and the L5 pedicle.  Marching superiorly the cyst was medial  identified extended the laminotomy a little more superiorly get above it and then utilizing the 2 and 3 Miller Kerrison punch and a nerve hook I worked up underneath the facet joint and resected the cyst and resected the cyst wall membrane from the medial aspect the facet joint.  At the end of the cyst resection the L5 nerve root was widely decompressed I did unroofed the foramen to confirm adequate decompression I did inspect the disc base coagulated the epidural veins disc was not felt to be an issue wounds are copiously irrigated meticulous hemostasis was maintained Gelfoam was ON top of the dura the muscle fascia approximate layers with interrupted Vicryl and skin was closed running 4 subcuticular Dermabond benzoin Steri-Strips and a sterile dressing was applied patient recovery in stable condition.  At the end the case all needle counts and sponge counts were correct.

## 2019-11-11 NOTE — H&P (Signed)
Ross [name]. is an 79 y.o. male.   Chief Complaint: Back and right leg pain HPI: 79 year old gentleman with progressive worsening back and right L5 radicular symptoms.  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  Due to patient's progression of clinical syndrome imaging findings and failure of conservative treatment I recommended laminectomy for resection of synovial cyst.  I extensively gone over the risks and benefits of that procedure with him as well as perioperative course expectations of outcome and alternatives of surgery and he understands and agrees to proceed forward.  Past Medical History:  Diagnosis Date  . Cancer ([place])    skin basal cell  . Essential and other specified forms of tremor 08/30/2013  . History of kidney stones   . Hypertension   . Kidney stones   . Pneumonia   . Stroke [facility]) 2005    Past Surgical History:  Procedure Laterality Date  . APPENDECTOMY    . CATARACT EXTRACTION     bilateral  . CHOLECYSTECTOMY    . CYSTOSCOPY WITH RETROGRADE PYELOGRAM, URETEROSCOPY AND STENT PLACEMENT Left 06/11/2013   Procedure: CYSTOSCOPY WITH RETROGRADE Left PYELOGRAM, URETEROSCOPY AND Left STENT PLACEMENT;  Surgeon: Ailene Rud, MD;  Location: [facility];  Service: Urology;  Laterality: Left;  . KNEE SURGERY     bilateral arthroscopic  . SHOULDER SURGERY     right   . TOTAL KNEE ARTHROPLASTY Right 12/06/2016   Procedure: RIGHT TOTAL KNEE ARTHROPLASTY;  Surgeon: Paralee Cancel, MD;  Location: [facility];  Service: Orthopedics;  Laterality: Right;  70 mins    Family History  Problem Relation Age of Onset  . Stroke Mother 92  . COPD Mother   . Heart attack Father 10  . Lymphoma Father        died age 32  . Cancer - Other Brother        esophageal  . Fibromyalgia Brother    Social History:  reports that he quit smoking about 23 years ago. His smoking use included cigarettes. He has a  18.00 pack-year smoking history. He has never used smokeless tobacco. He reports current alcohol use of about 2.0 standard drinks of alcohol per week. He reports that he does not use drugs.  Allergies:  Allergies  Allergen Reactions  . Dexlansoprazole Rash    Medications Prior to Admission  Medication Sig Dispense Refill  . aspirin 81 MG chewable tablet Chew 1 tablet (81 mg total) by mouth 2 (two) times daily. (Patient taking differently: Chew 81 mg by mouth daily. ) 60 tablet 0  . b complex vitamins tablet Take 1 tablet by mouth daily.     . Cholecalciferol (VITAMIN D) 2000 units CAPS Take 2,000 Units by mouth daily.    Marland Kitchen EPINEPHrine 0.3 mg/0.3 mL IJ SOAJ injection Inject 0.3 mg into the muscle as needed for anaphylaxis.     Marland Kitchen ezetimibe (ZETIA) 10 MG tablet Take 1 tablet (10 mg total) by mouth daily. 90 tablet 3  . fluticasone (FLONASE) 50 MCG/ACT nasal spray Place 1 spray into both nostrils daily.     . hydrocortisone cream 1 % Apply 1 application topically daily as needed for itching.    Marland Kitchen ketoconazole (NIZORAL) 2 % cream Apply 1 application topically daily as needed for irritation.    Marland Kitchen lisinopril-hydrochlorothiazide (ZESTORETIC) 20-12.5 MG tablet Take 1 tablet by mouth at bedtime.     . Multiple Vitamin (  MULTIVITAMIN) tablet Take 1 tablet by mouth daily.     . Multiple Vitamins-Minerals (PRESERVISION AREDS 2 PO) Take 1 capsule by mouth in the morning and at bedtime.    . potassium chloride SA (KLOR-CON M15) 15 MEQ tablet Take 30 mEq by mouth 2 (two) times daily.    Marland Kitchen PRESCRIPTION MEDICATION Inject 1 each into the skin every 30 (thirty) days. Allergy shot    . psyllium (METAMUCIL) 58.6 % packet Take 1 packet by mouth daily.    . sodium chloride (MURO 128) 5 % ophthalmic solution Place 1 drop into both eyes 2 (two) times daily.    . tamsulosin (FLOMAX) 0.4 MG CAPS capsule Take 0.4 mg by mouth every evening.    . cetirizine (ZYRTEC) 10 MG tablet Take 10 mg by mouth as needed for  allergies.  (Patient not taking: Reported on 10/31/2019)    . Potassium Citrate 15 MEQ (1620 MG) TBCR Take 2 tablets by mouth 2 (two) times daily. (Patient not taking: Reported on 10/31/2019)  6    No results found for this or any previous visit (from the past 48 hour(s)). No results found.  Review of Systems  Blood pressure (!) 171/64, pulse (!) 58, temperature 97.7 F (36.5 C), temperature source Oral, resp. rate 19, SpO2 96 %. Physical Exam   Assessment/Plan 79 year old presents for laminectomy for resection of synovial cyst on the right at L4-5  Elaina Hoops, MD 11/11/2019, 11:51 AM

## 2019-11-11 NOTE — Transfer of Care (Signed)
Immediate Anesthesia Transfer of Care Note  Patient: Farah Benish.  Procedure(s) Performed: Lumbar four-five Laminectomy for facet/synovial cyst (N/A Back)  Patient Location: PACU  Anesthesia Type:General  Level of Consciousness: awake and alert   Airway & Oxygen Therapy: Patient Spontanous Breathing and Patient connected to face mask oxygen  Post-op Assessment: Report given to RN, Post -op Vital signs reviewed and stable and Patient moving all extremities X 4  Post vital signs: Reviewed and stable  Last Vitals:  Vitals Value Taken Time  BP 169/81 11/11/19 1331  Temp    Pulse 67 11/11/19 1338  Resp 21 11/11/19 1338  SpO2 96 % 11/11/19 1338  Vitals shown include unvalidated device data.  Last Pain:  Vitals:   11/11/19 1041  TempSrc:   PainSc: 2       Patients Stated Pain Goal: 2 (44/51/46 0479)  Complications: No complications documented.

## 2019-11-11 NOTE — Anesthesia Procedure Notes (Signed)
Procedure Name: Intubation Date/Time: 11/11/2019 12:04 PM Performed by: Inda Coke, CRNA Pre-anesthesia Checklist: Patient identified, Emergency Drugs available, Suction available and Patient being monitored Patient Re-evaluated:Patient Re-evaluated prior to induction Oxygen Delivery Method: Circle System Utilized Preoxygenation: Pre-oxygenation with 100% oxygen Induction Type: IV induction Ventilation: Mask ventilation without difficulty and Oral airway inserted - appropriate to patient size Laryngoscope Size: Mac and 3 Grade View: Grade II Tube type: Oral Tube size: 7.5 mm Number of attempts: 1 Airway Equipment and Method: Stylet and Oral airway Placement Confirmation: ETT inserted through vocal cords under direct vision,  positive ETCO2 and breath sounds checked- equal and bilateral Secured at: 23 cm Tube secured with: Tape Dental Injury: Teeth and Oropharynx as per pre-operative assessment

## 2019-11-12 ENCOUNTER — Encounter (HOSPITAL_COMMUNITY): Payer: Self-pay | Admitting: Neurosurgery

## 2019-11-12 DIAGNOSIS — M7138 Other bursal cyst, other site: Secondary | ICD-10-CM | POA: Diagnosis not present

## 2019-11-12 MED ORDER — CYCLOBENZAPRINE HCL 10 MG PO TABS: 10.0000 mg | ORAL_TABLET | Freq: Three times a day (TID) | ORAL | 0 refills | Status: DC | PRN

## 2019-11-12 MED ORDER — HYDROCODONE-ACETAMINOPHEN 5-325 MG PO TABS
1.0000 | ORAL_TABLET | ORAL | Status: DC | PRN
  Administered 2019-11-12 (×2): 2 via ORAL
  Filled 2019-11-12 (×2): qty 2

## 2019-11-12 MED ORDER — HYDROCODONE-ACETAMINOPHEN 5-325 MG PO TABS: 1.0000 | ORAL_TABLET | Freq: Four times a day (QID) | ORAL | 0 refills | Status: DC | PRN

## 2019-11-12 NOTE — Discharge Summary (Signed)
Physician Discharge Summary  Patient ID: Ross [name]. MRN: [id number] DOB/AGE: 78/23/1942 78 y.o. Estimated body mass index is 28.25 kg/m as calculated from the following:   Height as of 11/05/19: 5\' 8"  (1.727 m).   Weight as of 11/05/19: 84.3 kg.   Admit date: 11/11/2019 Discharge date: 11/12/2019  Admission Diagnoses: Synovial cyst L4-5 right  Discharge Diagnoses: Same Active Problems:   Synovial cyst of lumbar facet joint   Discharged Condition: good  Hospital Course: Patient was admitted underwent decompressive laminectomy for resection of synovial cyst.  Postop patient did very well covering the floor and the floor was ambulating voiding spontaneously tolerating regular diet with complete resolution of his preoperative radicular symptoms.  Patient with discharge control follow-up in 1 to 2 weeks  Consults: Significant Diagnostic Studies: Treatments: Decompressive laminectomy for resection of synovial cyst Discharge Exam: Blood pressure (!) 136/55, pulse 72, temperature 98.7 F (37.1 C), temperature source Oral, resp. rate 17, SpO2 95 %. Strength 5 out of 5 wound clean dry and intact  Disposition: Home  Discharge Instructions     Remove dressing in 72 hours   Complete by: As directed    Call MD for:  difficulty breathing, headache or visual disturbances   Complete by: As directed    Call MD for:  hives   Complete by: As directed    Call MD for:  persistant dizziness or light-headedness   Complete by: As directed    Call MD for:  persistant nausea and vomiting   Complete by: As directed    Call MD for:  redness, tenderness, or signs of infection (pain, swelling, redness, odor or green/yellow discharge around incision site)   Complete by: As directed    Call MD for:  severe uncontrolled pain   Complete by: As directed    Call MD for:  temperature >100.4   Complete by: As directed    Diet - low sodium heart healthy   Complete by: As directed    Driving  Restrictions   Complete by: As directed    No driving for 2 weeks, no riding in the car for 1 week   Increase activity slowly   Complete by: As directed    Lifting restrictions   Complete by: As directed    No lifting more than 8 lbs     Allergies as of 11/12/2019      Reactions   Dexlansoprazole Rash      Medication List    STOP taking these medications   cetirizine 10 MG tablet Commonly known as: ZYRTEC   Potassium Citrate 15 MEQ (1620 MG) Tbcr     TAKE these medications   aspirin 81 MG chewable tablet Chew 1 tablet (81 mg total) by mouth 2 (two) times daily. What changed: when to take this   b complex vitamins tablet Take 1 tablet by mouth daily.   cyclobenzaprine 10 MG tablet Commonly known as: FLEXERIL Take 1 tablet (10 mg total) by mouth 3 (three) times daily as needed for muscle spasms.   EPINEPHrine 0.3 mg/0.3 mL Soaj injection Commonly known as: EPI-PEN Inject 0.3 mg into the muscle as needed for anaphylaxis.   ezetimibe 10 MG tablet Commonly known as: ZETIA Take 1 tablet (10 mg total) by mouth daily.   fluticasone 50 MCG/ACT nasal spray Commonly known as: FLONASE Place 1 spray into both nostrils daily.   HYDROcodone-acetaminophen 5-325 MG tablet Commonly known as: NORCO/VICODIN Take 1-2 tablets by mouth every 6 (six) hours as  needed for moderate pain.   hydrocortisone cream 1 % Apply 1 application topically daily as needed for itching.   ketoconazole 2 % cream Commonly known as: NIZORAL Apply 1 application topically daily as needed for irritation.   lisinopril-hydrochlorothiazide 20-12.5 MG tablet Commonly known as: ZESTORETIC Take 1 tablet by mouth at bedtime.   multivitamin tablet Take 1 tablet by mouth daily.   potassium chloride SA 15 MEQ tablet Commonly known as: KLOR-CON M15 Take 30 mEq by mouth 2 (two) times daily.   PRESCRIPTION MEDICATION Inject 1 each into the skin every 30 (thirty) days. Allergy shot   PRESERVISION AREDS 2  PO Take 1 capsule by mouth in the morning and at bedtime.   psyllium 58.6 % packet Commonly known as: METAMUCIL Take 1 packet by mouth daily.   sodium chloride 5 % ophthalmic solution Commonly known as: MURO 128 Place 1 drop into both eyes 2 (two) times daily.   tamsulosin 0.4 MG Caps capsule Commonly known as: FLOMAX Take 0.4 mg by mouth every evening.   Vitamin D 50 MCG (2000 UT) Caps Take 2,000 Units by mouth daily.        Signed: Elaina Hoops 11/12/2019, 7:48 AM

## 2019-11-12 NOTE — Discharge Summary (Signed)
Physician Discharge Summary  Patient ID: Ross [name]. MRN: [id number] DOB/AGE: Sep 20, 1940 79 y.o.  Admit date: 11/11/2019 Discharge date: 11/12/2019  Admission Diagnoses: Synovial cyst right L4-5 facet joint with right L5 radiculopathy    Discharge Diagnoses: same   Discharged Condition: good  Hospital Course: The patient was admitted on 11/11/2019 and taken to the operating room where the patient underwent right L4-5 laminectomy for synovial cyst resection. The patient tolerated the procedure well and was taken to the recovery room and then to the floor in stable condition. The hospital course was routine. There were no complications. The wound remained clean dry and intact. Pt had appropriate back soreness. No complaints of leg pain or new N/T/W. The patient remained afebrile with stable vital signs, and tolerated a regular diet. The patient continued to increase activities, and pain was well controlled with oral pain medications.   Consults: None  Significant Diagnostic Studies:  Results for orders placed or performed during the hospital encounter of 11/08/19  SARS CORONAVIRUS 2 (TAT 6-24 HRS) Nasopharyngeal Nasopharyngeal Swab   Specimen: Nasopharyngeal Swab  Result Value Ref Range   SARS Coronavirus 2 NEGATIVE NEGATIVE    DG Lumbar Spine 2-3 Views  Result Date: 11/11/2019 CLINICAL DATA:  L4-5 laminectomy, intraoperative examination EXAM: LUMBAR SPINE - 2-3 VIEW COMPARISON:  10/22/2019 FINDINGS: Two intraoperative portable cross-table lateral radiographs of the lumbar spine are presented for interpretation. Initial image demonstrates a needle-like metallic marker posterior to the spinous process of L4. Subsequent image demonstrates removal of the needle like marker, placement of soft tissue retractors and a single Ray-Tec posterior to the pedicle of L5, and a metallic probe overlying the posterior cortex of the a L4 inferior facet. Moderate degenerative disc disease with vacuum  disc phenomena is noted at L5-S1. No other changes. IMPRESSION: Intraoperative radiographs as described above. Electronically Signed   By: Fidela Salisbury MD   On: 11/11/2019 16:25    Antibiotics:  Anti-infectives (From admission, onward)   Start     Dose/Rate Route Frequency Ordered Stop   11/11/19 2000  ceFAZolin (ANCEF) IVPB 2g/100 mL premix        2 g 200 mL/hr over 30 Minutes Intravenous Every 8 hours 11/11/19 1437 11/12/19 0306   11/11/19 1233  bacitracin 50,000 Units in sodium chloride 0.9 % 500 mL irrigation  Status:  Discontinued          As needed 11/11/19 1233 11/11/19 1328   11/11/19 1030  ceFAZolin (ANCEF) IVPB 2g/100 mL premix        2 g 200 mL/hr over 30 Minutes Intravenous On call to O.R. 11/11/19 1020 11/11/19 1205   11/11/19 1029  ceFAZolin (ANCEF) 2-4 GM/100ML-% IVPB       Note to Pharmacy: Tressia Miners Ch: cabinet override      11/11/19 1029 11/11/19 1216      Discharge Exam: Blood pressure (!) 136/55, pulse 72, temperature 98.7 F (37.1 C), temperature source Oral, resp. rate 17, SpO2 95 %. Neurologic: Grossly normal Ambulating and voiding well, incision cdi  Discharge Medications:   Allergies as of 11/12/2019      Reactions   Dexlansoprazole Rash      Medication List    STOP taking these medications   cetirizine 10 MG tablet Commonly known as: ZYRTEC   Potassium Citrate 15 MEQ (1620 MG) Tbcr     TAKE these medications   aspirin 81 MG chewable tablet Chew 1 tablet (81 mg total) by mouth 2 (two) times daily.  What changed: when to take this   b complex vitamins tablet Take 1 tablet by mouth daily.   cyclobenzaprine 10 MG tablet Commonly known as: FLEXERIL Take 1 tablet (10 mg total) by mouth 3 (three) times daily as needed for muscle spasms.   EPINEPHrine 0.3 mg/0.3 mL Soaj injection Commonly known as: EPI-PEN Inject 0.3 mg into the muscle as needed for anaphylaxis.   ezetimibe 10 MG tablet Commonly known as: ZETIA Take 1 tablet (10 mg  total) by mouth daily.   fluticasone 50 MCG/ACT nasal spray Commonly known as: FLONASE Place 1 spray into both nostrils daily.   HYDROcodone-acetaminophen 5-325 MG tablet Commonly known as: NORCO/VICODIN Take 1-2 tablets by mouth every 6 (six) hours as needed for moderate pain.   hydrocortisone cream 1 % Apply 1 application topically daily as needed for itching.   ketoconazole 2 % cream Commonly known as: NIZORAL Apply 1 application topically daily as needed for irritation.   lisinopril-hydrochlorothiazide 20-12.5 MG tablet Commonly known as: ZESTORETIC Take 1 tablet by mouth at bedtime.   multivitamin tablet Take 1 tablet by mouth daily.   potassium chloride SA 15 MEQ tablet Commonly known as: KLOR-CON M15 Take 30 mEq by mouth 2 (two) times daily.   PRESCRIPTION MEDICATION Inject 1 each into the skin every 30 (thirty) days. Allergy shot   PRESERVISION AREDS 2 PO Take 1 capsule by mouth in the morning and at bedtime.   psyllium 58.6 % packet Commonly known as: METAMUCIL Take 1 packet by mouth daily.   sodium chloride 5 % ophthalmic solution Commonly known as: MURO 128 Place 1 drop into both eyes 2 (two) times daily.   tamsulosin 0.4 MG Caps capsule Commonly known as: FLOMAX Take 0.4 mg by mouth every evening.   Vitamin D 50 MCG (2000 UT) Caps Take 2,000 Units by mouth daily.       Disposition: home   Final Dx: right L4-5 laminectomy for synovial cyst resection  Discharge Instructions     Remove dressing in 72 hours   Complete by: As directed    Call MD for:  difficulty breathing, headache or visual disturbances   Complete by: As directed    Call MD for:  hives   Complete by: As directed    Call MD for:  persistant dizziness or light-headedness   Complete by: As directed    Call MD for:  persistant nausea and vomiting   Complete by: As directed    Call MD for:  redness, tenderness, or signs of infection (pain, swelling, redness, odor or green/yellow  discharge around incision site)   Complete by: As directed    Call MD for:  severe uncontrolled pain   Complete by: As directed    Call MD for:  temperature >100.4   Complete by: As directed    Diet - low sodium heart healthy   Complete by: As directed    Driving Restrictions   Complete by: As directed    No driving for 2 weeks, no riding in the car for 1 week   Increase activity slowly   Complete by: As directed    Lifting restrictions   Complete by: As directed    No lifting more than 8 lbs         Signed: Ocie Cornfield Tristain Daily 11/12/2019, 7:45 AM

## 2019-11-12 NOTE — Progress Notes (Signed)
Pt doing well. Pt and wife given D/C instructions with verbal understanding. Rx's were sent to the pharmacy by MD. Pt's incision is clean and dry with no sign of infection. Pt's IV was removed prior to D/C. Pt D/C'd home via wheelchair per MD order. Pt is stable @ D/C and has no other needs at this time. Leonora Gores, RN  

## 2019-11-25 DIAGNOSIS — J3081 Allergic rhinitis due to animal (cat) (dog) hair and dander: Secondary | ICD-10-CM | POA: Diagnosis not present

## 2019-11-25 DIAGNOSIS — J301 Allergic rhinitis due to pollen: Secondary | ICD-10-CM | POA: Diagnosis not present

## 2019-11-25 DIAGNOSIS — J3089 Other allergic rhinitis: Secondary | ICD-10-CM | POA: Diagnosis not present

## 2019-12-03 DIAGNOSIS — Z961 Presence of intraocular lens: Secondary | ICD-10-CM | POA: Diagnosis not present

## 2019-12-03 DIAGNOSIS — D3131 Benign neoplasm of right choroid: Secondary | ICD-10-CM | POA: Diagnosis not present

## 2019-12-03 DIAGNOSIS — H5212 Myopia, left eye: Secondary | ICD-10-CM | POA: Diagnosis not present

## 2019-12-03 DIAGNOSIS — H52201 Unspecified astigmatism, right eye: Secondary | ICD-10-CM | POA: Diagnosis not present

## 2019-12-13 DIAGNOSIS — D485 Neoplasm of uncertain behavior of skin: Secondary | ICD-10-CM | POA: Diagnosis not present

## 2019-12-13 DIAGNOSIS — L578 Other skin changes due to chronic exposure to nonionizing radiation: Secondary | ICD-10-CM | POA: Diagnosis not present

## 2019-12-13 DIAGNOSIS — Z86018 Personal history of other benign neoplasm: Secondary | ICD-10-CM | POA: Diagnosis not present

## 2019-12-13 DIAGNOSIS — Z85828 Personal history of other malignant neoplasm of skin: Secondary | ICD-10-CM | POA: Diagnosis not present

## 2019-12-13 DIAGNOSIS — L821 Other seborrheic keratosis: Secondary | ICD-10-CM | POA: Diagnosis not present

## 2019-12-13 DIAGNOSIS — L570 Actinic keratosis: Secondary | ICD-10-CM | POA: Diagnosis not present

## 2019-12-13 DIAGNOSIS — D225 Melanocytic nevi of trunk: Secondary | ICD-10-CM | POA: Diagnosis not present

## 2019-12-13 DIAGNOSIS — L820 Inflamed seborrheic keratosis: Secondary | ICD-10-CM | POA: Diagnosis not present

## 2019-12-13 DIAGNOSIS — C44612 Basal cell carcinoma of skin of right upper limb, including shoulder: Secondary | ICD-10-CM | POA: Diagnosis not present

## 2019-12-13 DIAGNOSIS — L738 Other specified follicular disorders: Secondary | ICD-10-CM | POA: Diagnosis not present

## 2019-12-16 DIAGNOSIS — M545 Low back pain: Secondary | ICD-10-CM | POA: Diagnosis not present

## 2019-12-19 DIAGNOSIS — M545 Low back pain: Secondary | ICD-10-CM | POA: Diagnosis not present

## 2019-12-23 DIAGNOSIS — M545 Low back pain: Secondary | ICD-10-CM | POA: Diagnosis not present

## 2019-12-26 DIAGNOSIS — M545 Low back pain: Secondary | ICD-10-CM | POA: Diagnosis not present

## 2019-12-31 DIAGNOSIS — M545 Low back pain: Secondary | ICD-10-CM | POA: Diagnosis not present

## 2020-01-02 DIAGNOSIS — M545 Low back pain: Secondary | ICD-10-CM | POA: Diagnosis not present

## 2020-01-06 DIAGNOSIS — J3089 Other allergic rhinitis: Secondary | ICD-10-CM | POA: Diagnosis not present

## 2020-01-06 DIAGNOSIS — M545 Low back pain: Secondary | ICD-10-CM | POA: Diagnosis not present

## 2020-01-06 DIAGNOSIS — J301 Allergic rhinitis due to pollen: Secondary | ICD-10-CM | POA: Diagnosis not present

## 2020-01-08 DIAGNOSIS — H16223 Keratoconjunctivitis sicca, not specified as Sjogren's, bilateral: Secondary | ICD-10-CM | POA: Diagnosis not present

## 2020-01-08 DIAGNOSIS — H34231 Retinal artery branch occlusion, right eye: Secondary | ICD-10-CM | POA: Diagnosis not present

## 2020-01-08 DIAGNOSIS — Z961 Presence of intraocular lens: Secondary | ICD-10-CM | POA: Diagnosis not present

## 2020-01-08 DIAGNOSIS — M545 Low back pain: Secondary | ICD-10-CM | POA: Diagnosis not present

## 2020-01-08 DIAGNOSIS — H04123 Dry eye syndrome of bilateral lacrimal glands: Secondary | ICD-10-CM | POA: Diagnosis not present

## 2020-01-10 DIAGNOSIS — J301 Allergic rhinitis due to pollen: Secondary | ICD-10-CM | POA: Diagnosis not present

## 2020-01-10 DIAGNOSIS — J3089 Other allergic rhinitis: Secondary | ICD-10-CM | POA: Diagnosis not present

## 2020-01-28 DIAGNOSIS — L821 Other seborrheic keratosis: Secondary | ICD-10-CM | POA: Diagnosis not present

## 2020-01-28 DIAGNOSIS — C44519 Basal cell carcinoma of skin of other part of trunk: Secondary | ICD-10-CM | POA: Diagnosis not present

## 2020-01-29 DIAGNOSIS — Z961 Presence of intraocular lens: Secondary | ICD-10-CM | POA: Diagnosis not present

## 2020-01-29 DIAGNOSIS — H04221 Epiphora due to insufficient drainage, right lacrimal gland: Secondary | ICD-10-CM | POA: Diagnosis not present

## 2020-01-29 DIAGNOSIS — H16223 Keratoconjunctivitis sicca, not specified as Sjogren's, bilateral: Secondary | ICD-10-CM | POA: Diagnosis not present

## 2020-01-29 DIAGNOSIS — H16222 Keratoconjunctivitis sicca, not specified as Sjogren's, left eye: Secondary | ICD-10-CM | POA: Diagnosis not present

## 2020-01-29 DIAGNOSIS — H16221 Keratoconjunctivitis sicca, not specified as Sjogren's, right eye: Secondary | ICD-10-CM | POA: Diagnosis not present

## 2020-01-29 DIAGNOSIS — H04222 Epiphora due to insufficient drainage, left lacrimal gland: Secondary | ICD-10-CM | POA: Diagnosis not present

## 2020-01-29 DIAGNOSIS — H04223 Epiphora due to insufficient drainage, bilateral lacrimal glands: Secondary | ICD-10-CM | POA: Diagnosis not present

## 2020-01-29 DIAGNOSIS — H34231 Retinal artery branch occlusion, right eye: Secondary | ICD-10-CM | POA: Diagnosis not present

## 2020-02-05 DIAGNOSIS — Z96651 Presence of right artificial knee joint: Secondary | ICD-10-CM | POA: Diagnosis not present

## 2020-02-05 DIAGNOSIS — T8484XA Pain due to internal orthopedic prosthetic devices, implants and grafts, initial encounter: Secondary | ICD-10-CM | POA: Diagnosis not present

## 2020-02-05 DIAGNOSIS — M7061 Trochanteric bursitis, right hip: Secondary | ICD-10-CM | POA: Diagnosis not present

## 2020-02-07 ENCOUNTER — Other Ambulatory Visit (HOSPITAL_COMMUNITY): Payer: Self-pay | Admitting: Orthopedic Surgery

## 2020-02-07 ENCOUNTER — Other Ambulatory Visit: Payer: Self-pay | Admitting: Orthopedic Surgery

## 2020-02-07 DIAGNOSIS — Z96651 Presence of right artificial knee joint: Secondary | ICD-10-CM

## 2020-02-12 ENCOUNTER — Encounter (HOSPITAL_COMMUNITY)
Admission: RE | Admit: 2020-02-12 | Discharge: 2020-02-12 | Disposition: A | Discharge status: Home / Self Care | Payer: PPO | Source: Ambulatory Visit | Attending: Orthopedic Surgery | Admitting: Orthopedic Surgery

## 2020-02-12 ENCOUNTER — Other Ambulatory Visit: Payer: Self-pay

## 2020-02-12 DIAGNOSIS — Z96651 Presence of right artificial knee joint: Secondary | ICD-10-CM | POA: Diagnosis not present

## 2020-02-12 DIAGNOSIS — M25561 Pain in right knee: Secondary | ICD-10-CM | POA: Diagnosis not present

## 2020-02-12 DIAGNOSIS — M7989 Other specified soft tissue disorders: Secondary | ICD-10-CM | POA: Diagnosis not present

## 2020-02-12 MED ORDER — TECHNETIUM TC 99M MEDRONATE IV KIT
21.2000 | PACK | Freq: Once | INTRAVENOUS | Status: AC | PRN
  Administered 2020-02-12: 21.2 via INTRAVENOUS

## 2020-02-13 DIAGNOSIS — J3089 Other allergic rhinitis: Secondary | ICD-10-CM | POA: Diagnosis not present

## 2020-02-13 DIAGNOSIS — J301 Allergic rhinitis due to pollen: Secondary | ICD-10-CM | POA: Diagnosis not present

## 2020-03-09 DIAGNOSIS — J301 Allergic rhinitis due to pollen: Secondary | ICD-10-CM | POA: Diagnosis not present

## 2020-03-09 DIAGNOSIS — J3089 Other allergic rhinitis: Secondary | ICD-10-CM | POA: Diagnosis not present

## 2020-03-11 DIAGNOSIS — H16213 Exposure keratoconjunctivitis, bilateral: Secondary | ICD-10-CM | POA: Diagnosis not present

## 2020-03-11 DIAGNOSIS — M25561 Pain in right knee: Secondary | ICD-10-CM | POA: Diagnosis not present

## 2020-03-11 DIAGNOSIS — J342 Deviated nasal septum: Secondary | ICD-10-CM | POA: Diagnosis not present

## 2020-03-11 DIAGNOSIS — H02132 Senile ectropion of right lower eyelid: Secondary | ICD-10-CM | POA: Diagnosis not present

## 2020-03-11 DIAGNOSIS — H04123 Dry eye syndrome of bilateral lacrimal glands: Secondary | ICD-10-CM | POA: Diagnosis not present

## 2020-03-11 DIAGNOSIS — H04223 Epiphora due to insufficient drainage, bilateral lacrimal glands: Secondary | ICD-10-CM | POA: Diagnosis not present

## 2020-03-11 DIAGNOSIS — Z96651 Presence of right artificial knee joint: Secondary | ICD-10-CM | POA: Diagnosis not present

## 2020-03-11 DIAGNOSIS — H02135 Senile ectropion of left lower eyelid: Secondary | ICD-10-CM | POA: Diagnosis not present

## 2020-03-25 DIAGNOSIS — J3081 Allergic rhinitis due to animal (cat) (dog) hair and dander: Secondary | ICD-10-CM | POA: Diagnosis not present

## 2020-03-25 DIAGNOSIS — J3089 Other allergic rhinitis: Secondary | ICD-10-CM | POA: Diagnosis not present

## 2020-03-25 DIAGNOSIS — J301 Allergic rhinitis due to pollen: Secondary | ICD-10-CM | POA: Diagnosis not present

## 2020-04-21 DIAGNOSIS — J3089 Other allergic rhinitis: Secondary | ICD-10-CM | POA: Diagnosis not present

## 2020-04-21 DIAGNOSIS — J301 Allergic rhinitis due to pollen: Secondary | ICD-10-CM | POA: Diagnosis not present

## 2020-04-23 DIAGNOSIS — M7138 Other bursal cyst, other site: Secondary | ICD-10-CM | POA: Diagnosis not present

## 2020-05-20 DIAGNOSIS — J3089 Other allergic rhinitis: Secondary | ICD-10-CM | POA: Diagnosis not present

## 2020-05-20 DIAGNOSIS — J301 Allergic rhinitis due to pollen: Secondary | ICD-10-CM | POA: Diagnosis not present

## 2020-05-25 DIAGNOSIS — H04123 Dry eye syndrome of bilateral lacrimal glands: Secondary | ICD-10-CM | POA: Diagnosis not present

## 2020-05-28 DIAGNOSIS — M7138 Other bursal cyst, other site: Secondary | ICD-10-CM | POA: Diagnosis not present

## 2020-05-28 DIAGNOSIS — J3089 Other allergic rhinitis: Secondary | ICD-10-CM | POA: Diagnosis not present

## 2020-05-28 DIAGNOSIS — J301 Allergic rhinitis due to pollen: Secondary | ICD-10-CM | POA: Diagnosis not present

## 2020-06-01 DIAGNOSIS — N200 Calculus of kidney: Secondary | ICD-10-CM | POA: Diagnosis not present

## 2020-06-09 DIAGNOSIS — N200 Calculus of kidney: Secondary | ICD-10-CM | POA: Diagnosis not present

## 2020-06-09 DIAGNOSIS — N400 Enlarged prostate without lower urinary tract symptoms: Secondary | ICD-10-CM | POA: Diagnosis not present

## 2020-06-12 DIAGNOSIS — H1045 Other chronic allergic conjunctivitis: Secondary | ICD-10-CM | POA: Diagnosis not present

## 2020-06-12 DIAGNOSIS — J301 Allergic rhinitis due to pollen: Secondary | ICD-10-CM | POA: Diagnosis not present

## 2020-06-12 DIAGNOSIS — J3081 Allergic rhinitis due to animal (cat) (dog) hair and dander: Secondary | ICD-10-CM | POA: Diagnosis not present

## 2020-06-12 DIAGNOSIS — J3089 Other allergic rhinitis: Secondary | ICD-10-CM | POA: Diagnosis not present

## 2020-06-16 ENCOUNTER — Other Ambulatory Visit: Payer: Self-pay | Admitting: Cardiology

## 2020-06-16 DIAGNOSIS — J301 Allergic rhinitis due to pollen: Secondary | ICD-10-CM | POA: Diagnosis not present

## 2020-06-16 DIAGNOSIS — T8484XA Pain due to internal orthopedic prosthetic devices, implants and grafts, initial encounter: Secondary | ICD-10-CM | POA: Diagnosis not present

## 2020-06-16 DIAGNOSIS — Z96651 Presence of right artificial knee joint: Secondary | ICD-10-CM | POA: Diagnosis not present

## 2020-06-16 DIAGNOSIS — J3089 Other allergic rhinitis: Secondary | ICD-10-CM | POA: Diagnosis not present

## 2020-06-18 DIAGNOSIS — J301 Allergic rhinitis due to pollen: Secondary | ICD-10-CM | POA: Diagnosis not present

## 2020-06-18 DIAGNOSIS — J3089 Other allergic rhinitis: Secondary | ICD-10-CM | POA: Diagnosis not present

## 2020-06-22 DIAGNOSIS — J3089 Other allergic rhinitis: Secondary | ICD-10-CM | POA: Diagnosis not present

## 2020-06-22 DIAGNOSIS — J301 Allergic rhinitis due to pollen: Secondary | ICD-10-CM | POA: Diagnosis not present

## 2020-06-23 ENCOUNTER — Other Ambulatory Visit: Payer: Self-pay | Admitting: Urology

## 2020-06-23 DIAGNOSIS — N200 Calculus of kidney: Secondary | ICD-10-CM

## 2020-06-25 DIAGNOSIS — J301 Allergic rhinitis due to pollen: Secondary | ICD-10-CM | POA: Diagnosis not present

## 2020-06-25 DIAGNOSIS — J3089 Other allergic rhinitis: Secondary | ICD-10-CM | POA: Diagnosis not present

## 2020-06-29 DIAGNOSIS — J3089 Other allergic rhinitis: Secondary | ICD-10-CM | POA: Diagnosis not present

## 2020-06-29 DIAGNOSIS — J301 Allergic rhinitis due to pollen: Secondary | ICD-10-CM | POA: Diagnosis not present

## 2020-06-29 DIAGNOSIS — J3081 Allergic rhinitis due to animal (cat) (dog) hair and dander: Secondary | ICD-10-CM | POA: Diagnosis not present

## 2020-07-01 NOTE — Progress Notes (Signed)
Patient to arrive at 0600 on 07/06/2020. History and medications reviewed. All pre-procedure instructions given. Patient to stop ASA on 07/03/2020. NPO after MN on Sunday except for clear liquids until 0600. Driver secured.

## 2020-07-02 ENCOUNTER — Other Ambulatory Visit (HOSPITAL_COMMUNITY)
Admission: RE | Admit: 2020-07-02 | Discharge: 2020-07-02 | Disposition: A | Discharge status: Home / Self Care | Payer: PPO | Source: Ambulatory Visit | Attending: Urology | Admitting: Urology

## 2020-07-02 DIAGNOSIS — Z20822 Contact with and (suspected) exposure to covid-19: Secondary | ICD-10-CM | POA: Insufficient documentation

## 2020-07-02 DIAGNOSIS — Z01812 Encounter for preprocedural laboratory examination: Secondary | ICD-10-CM | POA: Insufficient documentation

## 2020-07-02 LAB — SARS CORONAVIRUS 2 (TAT 6-24 HRS): SARS Coronavirus 2: NEGATIVE

## 2020-07-06 ENCOUNTER — Other Ambulatory Visit: Payer: Self-pay

## 2020-07-06 ENCOUNTER — Encounter (HOSPITAL_BASED_OUTPATIENT_CLINIC_OR_DEPARTMENT_OTHER): Payer: Self-pay | Admitting: Urology

## 2020-07-06 ENCOUNTER — Ambulatory Visit (HOSPITAL_BASED_OUTPATIENT_CLINIC_OR_DEPARTMENT_OTHER)
Admission: RE | Admit: 2020-07-06 | Discharge: 2020-07-06 | Disposition: A | Discharge status: Home / Self Care | Payer: PPO | Attending: Urology | Admitting: Urology

## 2020-07-06 ENCOUNTER — Ambulatory Visit (HOSPITAL_COMMUNITY): Payer: PPO

## 2020-07-06 ENCOUNTER — Encounter (HOSPITAL_BASED_OUTPATIENT_CLINIC_OR_DEPARTMENT_OTHER)
Admission: RE | Disposition: A | Discharge status: Home / Self Care | Payer: Self-pay | Source: Home / Self Care | Attending: Urology

## 2020-07-06 DIAGNOSIS — Z833 Family history of diabetes mellitus: Secondary | ICD-10-CM | POA: Diagnosis not present

## 2020-07-06 DIAGNOSIS — Z79899 Other long term (current) drug therapy: Secondary | ICD-10-CM | POA: Diagnosis not present

## 2020-07-06 DIAGNOSIS — Z85820 Personal history of malignant melanoma of skin: Secondary | ICD-10-CM | POA: Insufficient documentation

## 2020-07-06 DIAGNOSIS — Z7982 Long term (current) use of aspirin: Secondary | ICD-10-CM | POA: Diagnosis not present

## 2020-07-06 DIAGNOSIS — Z87891 Personal history of nicotine dependence: Secondary | ICD-10-CM | POA: Diagnosis not present

## 2020-07-06 DIAGNOSIS — Z8673 Personal history of transient ischemic attack (TIA), and cerebral infarction without residual deficits: Secondary | ICD-10-CM | POA: Diagnosis not present

## 2020-07-06 DIAGNOSIS — Z96659 Presence of unspecified artificial knee joint: Secondary | ICD-10-CM | POA: Insufficient documentation

## 2020-07-06 DIAGNOSIS — N200 Calculus of kidney: Secondary | ICD-10-CM

## 2020-07-06 DIAGNOSIS — N2889 Other specified disorders of kidney and ureter: Secondary | ICD-10-CM | POA: Diagnosis not present

## 2020-07-06 DIAGNOSIS — Z9049 Acquired absence of other specified parts of digestive tract: Secondary | ICD-10-CM | POA: Diagnosis not present

## 2020-07-06 DIAGNOSIS — Z800 Family history of malignant neoplasm of digestive organs: Secondary | ICD-10-CM | POA: Diagnosis not present

## 2020-07-06 DIAGNOSIS — N400 Enlarged prostate without lower urinary tract symptoms: Secondary | ICD-10-CM | POA: Insufficient documentation

## 2020-07-06 HISTORY — PX: EXTRACORPOREAL SHOCK WAVE LITHOTRIPSY: SHX1557

## 2020-07-06 SURGERY — LITHOTRIPSY, ESWL
Anesthesia: LOCAL | Laterality: Left

## 2020-07-06 MED ORDER — CIPROFLOXACIN HCL 500 MG PO TABS
500.0000 mg | ORAL_TABLET | ORAL | Status: AC
  Administered 2020-07-06: 500 mg via ORAL

## 2020-07-06 MED ORDER — TRAMADOL HCL 50 MG PO TABS: 50.0000 mg | ORAL_TABLET | Freq: Four times a day (QID) | ORAL | 0 refills | Status: DC | PRN

## 2020-07-06 MED ORDER — OXYCODONE HCL 5 MG PO TABS: 5.0000 mg | ORAL_TABLET | ORAL | Status: DC | PRN

## 2020-07-06 MED ORDER — DIAZEPAM 5 MG PO TABS
10.0000 mg | ORAL_TABLET | ORAL | Status: AC
  Administered 2020-07-06: 10 mg via ORAL

## 2020-07-06 MED ORDER — SODIUM CHLORIDE 0.9 % IV SOLN: INTRAVENOUS | Status: DC

## 2020-07-06 MED ORDER — DIPHENHYDRAMINE HCL 25 MG PO CAPS
25.0000 mg | ORAL_CAPSULE | ORAL | Status: AC
  Administered 2020-07-06: 25 mg via ORAL

## 2020-07-06 NOTE — Discharge Instructions (Signed)

## 2020-07-06 NOTE — Op Note (Signed)
See [facility] OP note scanned into chart. Also because of the size, density, location and other factors that cannot be anticipated I feel this will likely be a staged procedure. This fact supersedes any indication in the scanned [facility] operative note to the contrary.  

## 2020-07-06 NOTE — H&P (Signed)
CC: Enlarged prostate   HPI: Ross [name] is a 80 year old male with a history of BPH (previously treated with tamsulosin) and kidney stones (on k-cit).   Last PSA- 0.56 (08/2017)   05/10/18: Ross [name] is here today for a routine annual visit. From a urinary standpoint, he reports a good FOS and feels like he is emptying his bladder well. He has occasional urgency/frequency and episodes of hesitancy, but is not bothered by it. Nocturia x 1-2. Denies interval UTIs, recent stone passage, flank pain, dysuria or hematuria. He does admit to drinking 1-2 alcoholic beverages at dinner. Overall, he is pleased with his voiding sxs and denies side effects from tamsulosin.   05/06/2019: Here today for annual OV with KUB. Over the past year voiding symptoms have remained grossly stable continuing tamsulosin taking at night. He endorses a good force of stream with only occasional intermittency but no bothersome daytime frequency/urgency, burning with urination, dysuria or visible blood in the urine. Denies any interval treatment for UTI. Biggest voiding complaint is nocturia ranging from 3-5 times nightly but upon further discussion with him patient takes lisinopril with HCTZ at night.   Denies any interval stone material passage, unilateral pain/discomfort. He does continue potassium citrate as well.   06/09/20: The patient is here today for routine follow-up and surveillance KUB. He denies interval stone episodes, flank pain or hematuria. Continues to take tamsulosin once daily with sustained improvement in his lower urinary symptoms. He denies interval UTIs or dysuria.     ALLERGIES: No Allergies    MEDICATIONS: Allergy Shots  Aspirin Ec 81 mg tablet, delayed release Oral  Centrum TABS Oral  D3-2000 50 mcg (2,000 unit) capsule Oral  Ezetimibe 10 mg tablet  Flonase Allergy Relief  Lisinopril-Hydrochlorothiazide 20 mg-12.5 mg tablet  Systane  Vitamin B Complex CAPS Oral  Zyrtec 10 mg tablet Oral     GU  PSH: Cystoscopy And Treatment - 2008 ESWL - 2016, 2008 TUMT - 2008       [place] Notes: Lithotripsy, Skin Debridement, Gallbladder Surgery, Lithotripsy, Surg Prostate Transureth Dest Tissue Microwave Thermotherapy, Cystoscopy With Manipulation Of Ureteral Calculus   knee replacement Aug 2018   NON-GU PSH: None   GU PMH: History of urolithiasis - 2020 BPH w/LUTS (Stable) - 2019, - 2018 Nocturia (Worsening) - 2018, Nocturia, - 2016 Renal calculus - 2018, Nephrolithiasis, - 2016 Ureteral calculus, Calculus of ureter - 2016, Obstruction of left ureteropelvic junction due to stone, - 2015 Urinary Frequency, Urinary frequency - 2016 Other microscopic hematuria, Microscopic hematuria - 2015 Dorsalgia, Unspec, Backache - 2015 Gross hematuria, Gross hematuria - 2015      PMH Notes:  2007-02-27 10:15:50 - Note: Malignant Melanoma Of The Skin   NON-GU PMH: Hypercalciuria, Hypercalciuria - 2016 Nausea, Nausea - 2015 Encounter for general adult medical examination without abnormal findings, Encounter for preventive health examination - 2015 Personal history of other diseases of the circulatory system, History of hypertension - 2014 Personal history of other endocrine, nutritional and metabolic disease, History of hypercholesterolemia - 2014 Personal history of transient ischemic attack (TIA), and cerebral infarction without residual deficits, History of transient cerebral ischemia - 2014    FAMILY HISTORY: Esophageal Cancer - Brother Family Health Status Number - Runs In Family Father Deceased At Age79 ___ - Runs In Family Mother Deceased At Age 73 from diabetic complicati - Runs In Family   SOCIAL HISTORY: Marital Status: Married Preferred Language: English; Ethnicity: Not Hispanic Or Latino; Race: White  Notes: Previous History Of Smoking, Occupation:, Marital History - Currently Married   REVIEW OF SYSTEMS:    GU Review Male:   Patient reports get up at night to urinate and stream  starts and stops. Patient denies frequent urination, hard to postpone urination, burning/ pain with urination, leakage of urine, trouble starting your stream, have to strain to urinate , erection problems, and penile pain.  Gastrointestinal (Upper):   Patient denies vomiting, nausea, and indigestion/ heartburn.  Gastrointestinal (Lower):   Patient denies diarrhea and constipation.  Constitutional:   Patient denies fever, night sweats, weight loss, and fatigue.  Skin:   Patient denies skin rash/ lesion and itching.  Eyes:   Patient denies blurred vision and double vision.  Ears/ Nose/ Throat:   Patient denies sore throat and sinus problems.  Hematologic/Lymphatic:   Patient denies swollen glands and easy bruising.  Cardiovascular:   Patient denies leg swelling and chest pains.  Respiratory:   Patient denies cough and shortness of breath.  Endocrine:   Patient denies excessive thirst.  Musculoskeletal:   Patient denies back pain and joint pain.  Neurological:   Patient denies headaches and dizziness.  Psychologic:   Patient denies depression and anxiety.   VITAL SIGNS:      06/09/2020 11:07 AM  Weight 185 lb / 83.91 kg  Height 68 in / 172.72 cm  BP 144/77 mmHg  Pulse 69 /min  Temperature 97.7 F / 36.5 C  BMI 28.1 kg/m   MULTI-SYSTEM PHYSICAL EXAMINATION:    Constitutional: Well-nourished. No physical deformities. Normally developed. Good grooming.  Neurologic / Psychiatric: Oriented to time, oriented to place, oriented to person. No depression, no anxiety, no agitation.  Musculoskeletal: Normal gait and station of head and neck.     Complexity of Data:  Lab Test Review:   BMP  X-Ray Review: KUB: Reviewed Films. Reviewed Report. Discussed With Patient.     06/22/04 12/31/03  PSA  Total PSA 0.23  0.54     06/01/20  General Chemistry  Sodium 140 mEq/L  Potassium 4.0 mEq/L  BUN 18 mg/dL  Creatinine 0.7 mg/dL  Chloride 104 mEq/L  CO2 30 mEq/L  Glucose 141 mg/dL  Calcium 8.8  mg/dL  eGFR African American 104.8   eGFR Non-Afr. American 90.4    PROCEDURES:         KUB - 74018  A single view of the abdomen is obtained.      Patient confirmed No Neulasta OnPro Device.    KUB today shows 5 mm stones in the upper and lower portion of the left renal shadow, stable from prior exams. There are no calcifications seen within the confines of the right renal shadow or along the expected course of either ureter. No bony or bowel abnormalities are appreciated.         Urinalysis Dipstick Dipstick Cont'd  Color: Yellow Bilirubin: Neg mg/dL  Appearance: Clear Ketones: Neg mg/dL  Specific Gravity: 1.015 Blood: Neg ery/uL  pH: 7.5 Protein: Trace mg/dL  Glucose: Neg mg/dL Urobilinogen: 0.2 mg/dL    Nitrites: Neg    Leukocyte Esterase: Neg leu/uL    ASSESSMENT:      ICD-10 Details  1 GU:   Renal calculus - N20.0 Chronic, Stable  2   BPH w/o LUTS - N40.0 Chronic, Stable   PLAN:           Orders         Schedule Labs: 1 Year - BMP  X-Rays: 1 Year - KUB  Return Visit/Planned Activity: 1 Year - Office Visit, Follow up MD  Return Visit/Planned Activity: Next Available Appointment - Schedule Surgery          Document Letter(s):  Created for Patient: Clinical Summary         Notes:   -The risks, benefits and alternatives of LEFT ESWL (focusing on his upper pole stone) was discussed with the patient. I described the risks which include arrhythmia, kidney contusion, kidney hemorrhage, need for transfusion, back discomfort, flank ecchymosis, flank abrasion, inability to break up stone, inability to pass stone fragments, Steinstrasse, infection associated with obstructing stones, need for different surgical procedure and possible need for repeat shockwave lithotripsy. The patient voices understanding and wishes to proceed.  -Continue potassium citrate 15 mEq twice daily  -Continue tamsulosin 0.4 mg once daily  -Repeat BMP in 12 months

## 2020-07-07 ENCOUNTER — Encounter (HOSPITAL_BASED_OUTPATIENT_CLINIC_OR_DEPARTMENT_OTHER): Payer: Self-pay | Admitting: Urology

## 2020-07-23 DIAGNOSIS — J3081 Allergic rhinitis due to animal (cat) (dog) hair and dander: Secondary | ICD-10-CM | POA: Diagnosis not present

## 2020-07-23 DIAGNOSIS — J3089 Other allergic rhinitis: Secondary | ICD-10-CM | POA: Diagnosis not present

## 2020-07-23 DIAGNOSIS — J301 Allergic rhinitis due to pollen: Secondary | ICD-10-CM | POA: Diagnosis not present

## 2020-07-28 DIAGNOSIS — L578 Other skin changes due to chronic exposure to nonionizing radiation: Secondary | ICD-10-CM | POA: Diagnosis not present

## 2020-07-28 DIAGNOSIS — C44319 Basal cell carcinoma of skin of other parts of face: Secondary | ICD-10-CM | POA: Diagnosis not present

## 2020-07-28 DIAGNOSIS — D485 Neoplasm of uncertain behavior of skin: Secondary | ICD-10-CM | POA: Diagnosis not present

## 2020-07-28 DIAGNOSIS — D225 Melanocytic nevi of trunk: Secondary | ICD-10-CM | POA: Diagnosis not present

## 2020-07-28 DIAGNOSIS — L570 Actinic keratosis: Secondary | ICD-10-CM | POA: Diagnosis not present

## 2020-07-28 DIAGNOSIS — L821 Other seborrheic keratosis: Secondary | ICD-10-CM | POA: Diagnosis not present

## 2020-07-28 DIAGNOSIS — Z85828 Personal history of other malignant neoplasm of skin: Secondary | ICD-10-CM | POA: Diagnosis not present

## 2020-07-28 DIAGNOSIS — Z86018 Personal history of other benign neoplasm: Secondary | ICD-10-CM | POA: Diagnosis not present

## 2020-08-20 ENCOUNTER — Telehealth: Payer: Self-pay | Admitting: *Deleted

## 2020-08-20 DIAGNOSIS — J3089 Other allergic rhinitis: Secondary | ICD-10-CM | POA: Diagnosis not present

## 2020-08-20 DIAGNOSIS — J301 Allergic rhinitis due to pollen: Secondary | ICD-10-CM | POA: Diagnosis not present

## 2020-08-20 NOTE — Telephone Encounter (Signed)
   [facility] Pre-operative Risk Assessment    Patient Name: Ross [name].  DOB: 06-Apr-1941  MRN: [id number]   Request for surgical clearance:  1. What type of surgery is being performed? OPEN SYNOVECTOMY/POLY EXCHANGE   2. When is this surgery scheduled? TBD   3. What type of clearance is required (medical clearance vs. Pharmacy clearance to hold med vs. Both)? BOTH  4. Are there any medications that need to be held prior to surgery and how long? ASA   5. Practice name and name of physician performing surgery? [facility] Irving Shows    6. What is the office phone number? [phone]   7.   What is the office fax number? [phone]   8.   Anesthesia type (None, local, MAC, general) ?

## 2020-08-20 NOTE — Telephone Encounter (Signed)
Primary Cardiologist:James Hochrein, MD  Chart reviewed as part of pre-operative protocol coverage. Because of Ross [name] Jr.'s past medical history and time since last visit, he/she will require a follow-up visit in order to better assess preoperative cardiovascular risk.  Pre-op covering staff: - Please schedule appointment and call patient to inform them. - Please contact requesting surgeon's office via preferred method (i.e, phone, fax) to inform them of need for appointment prior to surgery.  If applicable, this message will also be routed to pharmacy pool and/or primary cardiologist for input on holding anticoagulant/antiplatelet agent as requested below so that this information is available at time of patient's appointment.   Deberah Pelton, NP  08/20/2020, 11:31 AM

## 2020-08-20 NOTE — Telephone Encounter (Signed)
Pt has appt with Dr. [name] 09/01/20 for pre op clearance. I will forward notes to MD for upcoming appt. Will send notes to requesting office pt has appt.

## 2020-08-24 IMAGING — CR CHEST - 2 VIEW
2 series · 2 of 2 positions shown · non-contrast
Comparison: April 11, 2016.

CLINICAL DATA: Chest pain

EXAM:
CHEST - 2 VIEW

[chest pa]
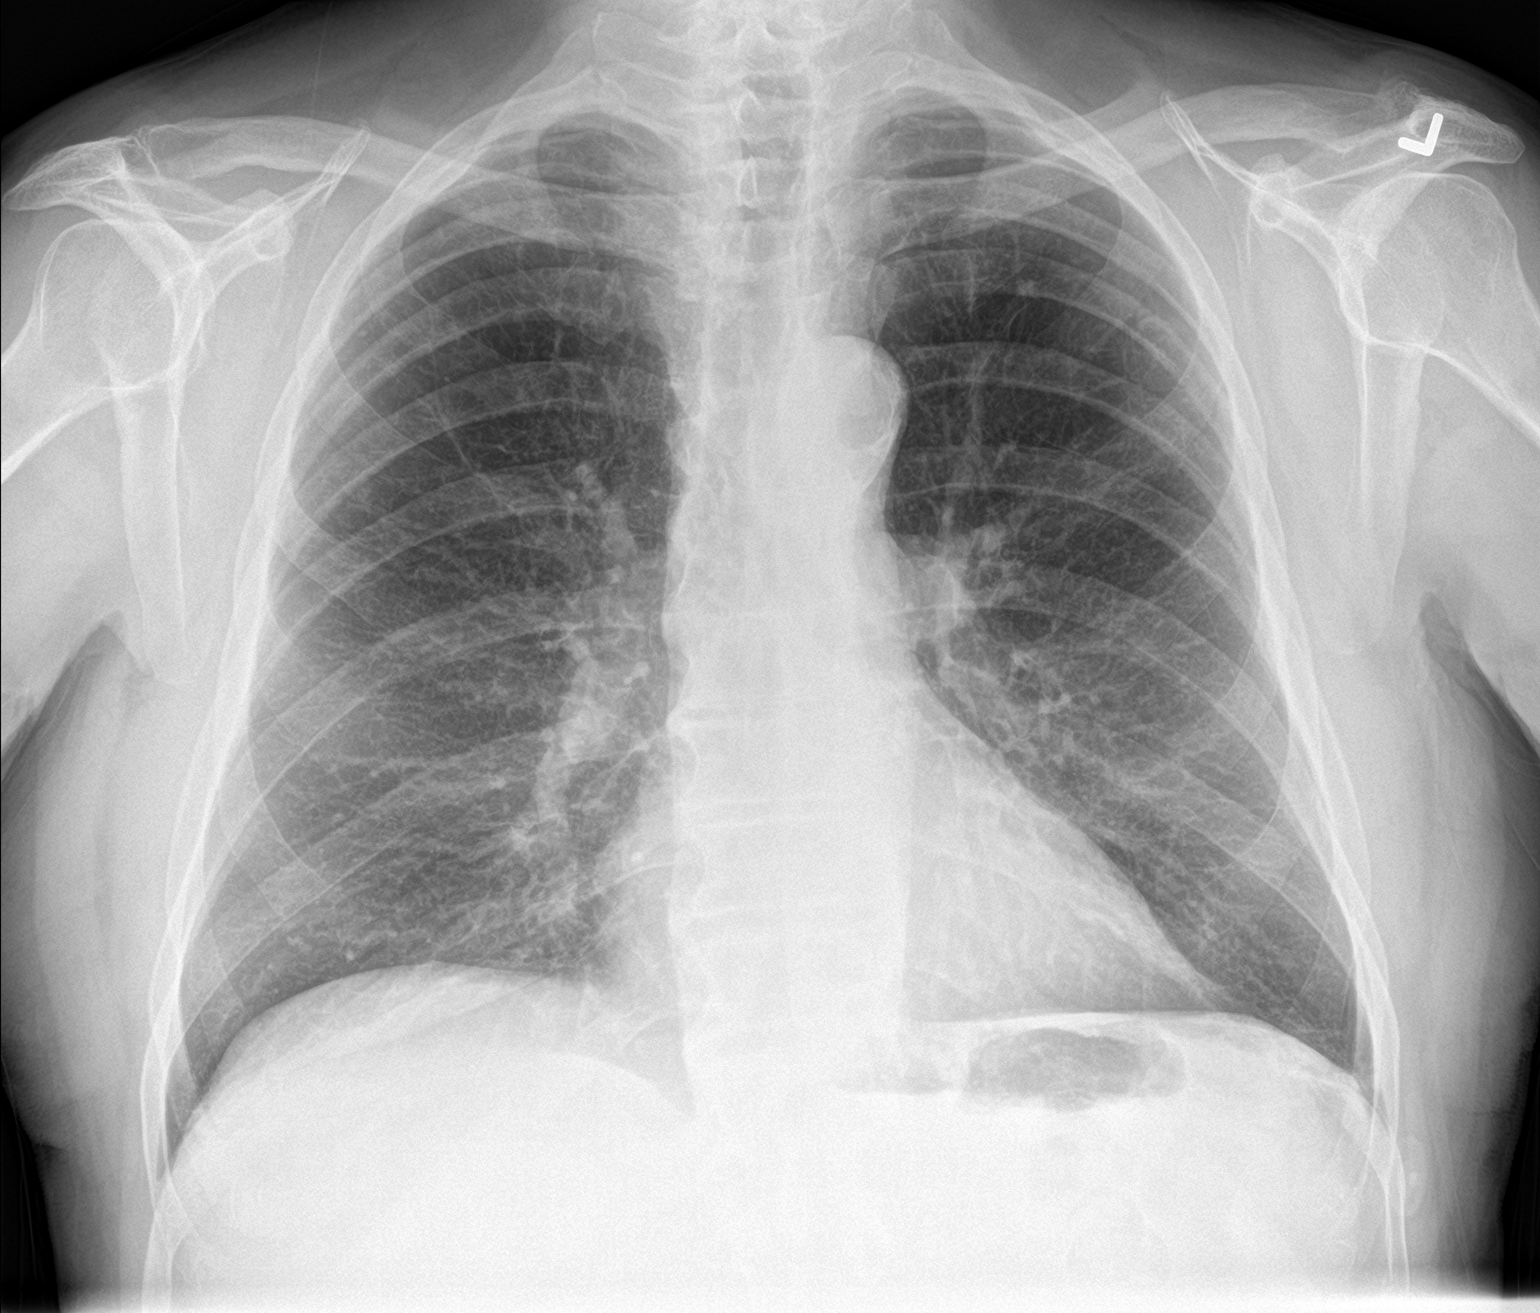

[chest lat]
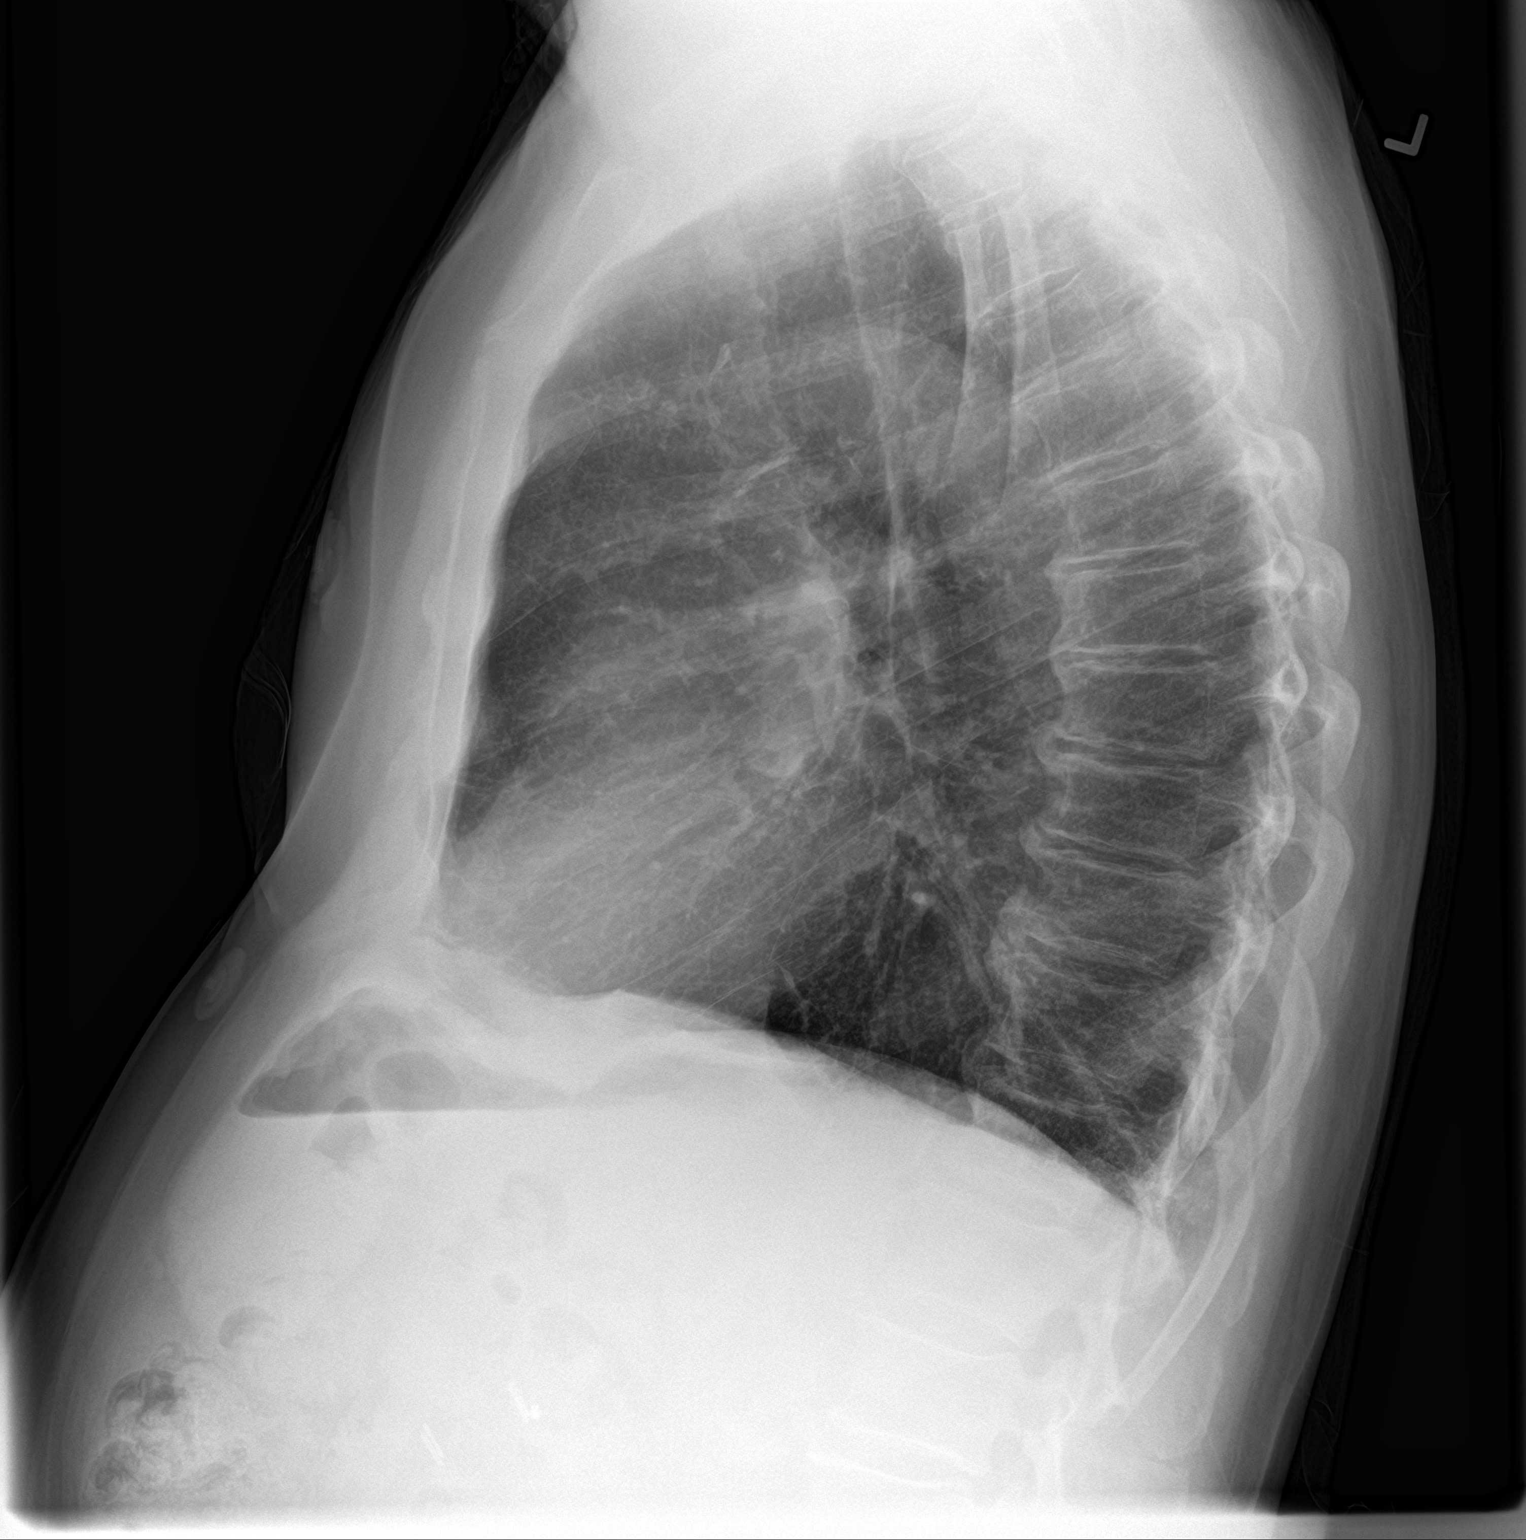

[2 of 2 positions shown; findings below may reference images not displayed]

FINDINGS: There is no edema or consolidation. The heart size and pulmonary
vascularity are normal. No adenopathy. There is aortic
atherosclerosis. There is degenerative change in the thoracic spine.
IMPRESSION: No edema or consolidation. Stable cardiac silhouette. Aortic
Atherosclerosis ([id number]).

## 2020-08-25 DIAGNOSIS — Z01818 Encounter for other preprocedural examination: Secondary | ICD-10-CM | POA: Diagnosis not present

## 2020-08-25 DIAGNOSIS — E785 Hyperlipidemia, unspecified: Secondary | ICD-10-CM | POA: Diagnosis not present

## 2020-08-25 DIAGNOSIS — R7301 Impaired fasting glucose: Secondary | ICD-10-CM | POA: Diagnosis not present

## 2020-08-25 DIAGNOSIS — I10 Essential (primary) hypertension: Secondary | ICD-10-CM | POA: Diagnosis not present

## 2020-08-25 DIAGNOSIS — I700 Atherosclerosis of aorta: Secondary | ICD-10-CM | POA: Diagnosis not present

## 2020-08-25 DIAGNOSIS — M25561 Pain in right knee: Secondary | ICD-10-CM | POA: Diagnosis not present

## 2020-08-30 NOTE — Progress Notes (Deleted)
Cardiology Office Note   Date:  08/30/2020   ID:  Ross Barefoot., DOB November 12, 1940, MRN [id number]  PCP:  Ginger Organ., MD  Cardiologist:   Minus Breeding, MD   No chief complaint on file.     History of Present Illness: Ross [name]. is a 80 y.o. male who presents for evaluation of aortic calcification. He's being evaluated for kidney stone and had a CT. He had some calcium in his aorta down into his iliacs.  Echo in 2012 demonstrated normal left ventricular function with some mild aortic valve calcification. He's had minimal plaque on carotid Dopplers. Stress perfusion study in 2008 demonstrated no evidence of ischemia. POET (Plain Old Exercise Treadmill) in 2016 had some nonspecific ST changes but no clear evidence of ischemia.  He returns for follow up.      Since I last saw him ***   ***  he has done well.  He walks routinely.  He still has a little knee pain from a total knee replacement but he is able to do significant physical activity. The patient denies any new symptoms such as chest discomfort, neck or arm discomfort. There has been no new shortness of breath, PND or orthopnea. There have been no reported palpitations, presyncope or syncope.    Past Medical History:  Diagnosis Date  . 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 Essential and other specified forms of tremor 08/30/2013  . History of kidney stones   . Hypertension   . Kidney stones   . Pneumonia   . Stroke [facility]) 2005    Past Surgical History:  Procedure Laterality Date  . APPENDECTOMY    . CATARACT EXTRACTION     bilateral  . CHOLECYSTECTOMY    . CYSTOSCOPY WITH RETROGRADE PYELOGRAM, URETEROSCOPY AND STENT PLACEMENT Left 06/11/2013   Procedure: CYSTOSCOPY WITH RETROGRADE Left PYELOGRAM, URETEROSCOPY AND Left STENT PLACEMENT;  Surgeon: Ailene Rud, MD;  Location: [facility];  Service: Urology;  Laterality: Left;  . EXTRACORPOREAL SHOCK WAVE LITHOTRIPSY Left 07/06/2020   Procedure:  EXTRACORPOREAL SHOCK WAVE LITHOTRIPSY (ESWL);  Surgeon: Ardis Hughs, MD;  [facility];  Service: Urology;  Laterality: Left;  . KNEE SURGERY     bilateral arthroscopic  . LUMBAR LAMINECTOMY/DECOMPRESSION MICRODISCECTOMY N/A 11/11/2019   Procedure: Lumbar four-five Laminectomy for facet/synovial cyst;  Surgeon: Kary Kos, MD;  Location: [place];  Service: Neurosurgery;  Laterality: N/A;  . SHOULDER SURGERY     right   . TOTAL KNEE ARTHROPLASTY Right 12/06/2016   Procedure: RIGHT TOTAL KNEE ARTHROPLASTY;  Surgeon: Paralee Cancel, MD;  Location: [facility];  Service: Orthopedics;  Laterality: Right;  70 mins     Current Outpatient Medications  Medication Sig Dispense Refill  . aspirin 81 MG chewable tablet Chew 1 tablet (81 mg total) by mouth 2 (two) times daily. (Patient taking differently: Chew 81 mg by mouth daily.) 60 tablet 0  . b complex vitamins tablet Take 1 tablet by mouth daily.     . carbamide peroxide (DEBROX) 6.5 % OTIC solution 5 drops 2 (two) times daily.    . Cholecalciferol (VITAMIN D) 2000 units CAPS Take 2,000 Units by mouth daily.    Marland Kitchen EPINEPHrine 0.3 mg/0.3 mL IJ SOAJ injection Inject 0.3 mg into the muscle as needed for anaphylaxis.     Marland Kitchen ezetimibe (ZETIA) 10 MG tablet TAKE 1 TABLET BY MOUTH EVERY DAY 90 tablet 1  .  fluticasone (FLONASE) 50 MCG/ACT nasal spray Place 1 spray into both nostrils daily.    . hydrocortisone cream 1 % Apply 1 application topically daily as needed for itching.    Marland Kitchen ketoconazole (NIZORAL) 2 % cream Apply 1 application topically daily as needed for irritation.    Marland Kitchen lisinopril-hydrochlorothiazide (ZESTORETIC) 20-12.5 MG tablet Take 1 tablet by mouth daily.    . Multiple Vitamin (MULTIVITAMIN) tablet Take 1 tablet by mouth daily.     . Multiple Vitamins-Minerals (PRESERVISION AREDS 2 PO) Take 1 capsule by mouth in the morning and at bedtime.    . potassium chloride SA (KLOR-CON M15) 15 MEQ tablet Take 30 mEq by mouth 2 (two)  times daily.    Marland Kitchen PRESCRIPTION MEDICATION Inject 1 each into the skin every 30 (thirty) days. Allergy shot    . psyllium (METAMUCIL) 58.6 % packet Take 1 packet by mouth daily.    . sodium chloride (MURO 128) 5 % ophthalmic solution Place 1 drop into both eyes 2 (two) times daily.    . tamsulosin (FLOMAX) 0.4 MG CAPS capsule Take 0.4 mg by mouth every evening.    . traMADol (ULTRAM) 50 MG tablet Take 1-2 tablets (50-100 mg total) by mouth every 6 (six) hours as needed for moderate pain. 15 tablet 0   No current facility-administered medications for this visit.    Allergies:   Dexlansoprazole    ROS:  Please see the history of present illness.   Otherwise, review of systems are positive for ***.   All other systems are reviewed and negative.    PHYSICAL EXAM: VS:  There were no vitals taken for this visit. , BMI There is no height or weight on file to calculate BMI.  GENERAL:  Well appearing NECK:  No jugular venous distention, waveform within normal limits, carotid upstroke brisk and symmetric, no bruits, no thyromegaly LUNGS:  Clear to auscultation bilaterally CHEST:  Unremarkable HEART:  PMI not displaced or sustained,S1 and S2 within normal limits, no S3, no S4, no clicks, no rubs, *** murmurs ABD:  Flat, positive bowel sounds normal in frequency in pitch, no bruits, no rebound, no guarding, no midline pulsatile mass, no hepatomegaly, no splenomegaly EXT:  2 plus pulses throughout, no edema, no cyanosis no clubbing    ***GENERAL:  Well appearing NECK:  No jugular venous distention, waveform within normal limits, carotid upstroke brisk and symmetric, no bruits, no thyromegaly LUNGS:  Clear to auscultation bilaterally CHEST:  Unremarkable HEART:  PMI not displaced or sustained,S1 and S2 within normal limits, no S3, no S4, no clicks, no rubs, no murmurs ABD:  Flat, positive bowel sounds normal in frequency in pitch, no bruits, no rebound, no guarding, no midline pulsatile mass, no  hepatomegaly, no splenomegaly EXT:  2 plus pulses throughout, no edema, no cyanosis no clubbing    EKG:  EKG is *** ordered today. The ekg ordered today demonstrates sinus rhythm, rate ***, axis within normal limits, intervals within normal limits, no acute ST-T wave changes. PVCs.   08/30/2020    Wt Readings from Last 3 Encounters:  07/06/20 185 lb (83.9 kg)  11/05/19 185 lb 12.8 oz (84.3 kg)  05/23/19 189 lb 6.4 oz (85.9 kg)    No results found for: CHOL, TRIG, HDL, LDLCALC, LDLDIRECT   Other studies Reviewed: Additional studies/ records that were reviewed today include: Labs Review of the above records demonstrates: NA  ASSESSMENT AND PLAN:   AORTIC ATHEROSCLEROSIS:   ***  The patient does have  atherosclerosis in his iliacs and in his aorta from previous CT scan.  He had a nondiagnostic but low risk treadmill test in 2016.  He is quite active and has no symptoms.  Given this I will think further testing is indicated.  However, we spent quite a bit of time discussing risk reduction.   RISK REDUCTION:   ***  I think his LDL needs to be less than 100.  He was 179.  He has been intolerant of statins.  I prescribed Zetia 10 mg daily.  He can get this followed by Ginger Organ., MD  HTN:   *** The blood pressure is elevated but he says it is not at home but at the same time says that his blood pressure cuff is not necessarily reliable.  It is a wrist cuff.  I suggested an arm cuff and we talked about the brand.   Current medicines are reviewed at length with the patient today.  The patient does not have concerns regarding medicines.  The following changes have been made:  ***  Labs/ tests ordered today include:   ***  No orders of the defined types were placed in this encounter.    Disposition:   FU with me in *** months.    Signed, Minus Breeding, MD  08/30/2020 7:16 PM    [facility]

## 2020-09-01 ENCOUNTER — Ambulatory Visit: Payer: PPO | Admitting: Cardiology

## 2020-09-01 DIAGNOSIS — I10 Essential (primary) hypertension: Secondary | ICD-10-CM

## 2020-09-01 DIAGNOSIS — I700 Atherosclerosis of aorta: Secondary | ICD-10-CM

## 2020-09-08 NOTE — Progress Notes (Signed)
Cardiology Office Note   Date:  09/09/2020   ID:  [name], DOB 03-13-1941, MRN [id number]  PCP:  Ginger Organ., MD  Cardiologist:   Minus Breeding, MD   No chief complaint on file.     History of Present Illness: Nyzier Boivin. is a 80 y.o. male who presents for evaluation of aortic calcification. He's being evaluated for kidney stone and had a CT. He had some calcium in his aorta down into his iliacs.  Echo in 2012 demonstrated normal left ventricular function with some mild aortic valve calcification. He's had minimal plaque on carotid Dopplers. Stress perfusion study in 2008 demonstrated no evidence of ischemia. POET (Plain Old Exercise Treadmill) in 2016 had some nonspecific ST changes but no clear evidence of ischemia.  He returns for follow up.      Since I last saw him he has done well.  He needs to have reoperation on a knee.  He still able to be active walking most days, doing tai chi and golfing. The patient denies any new symptoms such as chest discomfort, neck or arm discomfort. There has been no new shortness of breath, PND or orthopnea. There have been no reported palpitations, presyncope or syncope.     Past Medical History:  Diagnosis Date  . 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 Essential and other specified forms of tremor 08/30/2013  . History of kidney stones   . Hypertension   . Kidney stones   . Pneumonia   . Stroke [facility]) 2005    Past Surgical History:  Procedure Laterality Date  . APPENDECTOMY    . CATARACT EXTRACTION     bilateral  . CHOLECYSTECTOMY    . CYSTOSCOPY WITH RETROGRADE PYELOGRAM, URETEROSCOPY AND STENT PLACEMENT Left 06/11/2013   Procedure: CYSTOSCOPY WITH RETROGRADE Left PYELOGRAM, URETEROSCOPY AND Left STENT PLACEMENT;  Surgeon: Ailene Rud, MD;  [facility];  Service: Urology;  Laterality: Left;  . EXTRACORPOREAL SHOCK WAVE LITHOTRIPSY Left 07/06/2020   Procedure: EXTRACORPOREAL SHOCK WAVE LITHOTRIPSY  (ESWL);  Surgeon: Ardis Hughs, MD;  Location: [facility];  Service: Urology;  Laterality: Left;  . KNEE SURGERY     bilateral arthroscopic  . LUMBAR LAMINECTOMY/DECOMPRESSION MICRODISCECTOMY N/A 11/11/2019   Procedure: Lumbar four-five Laminectomy for facet/synovial cyst;  Surgeon: Kary Kos, MD;  Location: [place];  Service: Neurosurgery;  Laterality: N/A;  . SHOULDER SURGERY     right   . TOTAL KNEE ARTHROPLASTY Right 12/06/2016   Procedure: RIGHT TOTAL KNEE ARTHROPLASTY;  Surgeon: Paralee Cancel, MD;  [facility];  Service: Orthopedics;  Laterality: Right;  70 mins     Current Outpatient Medications  Medication Sig Dispense Refill  . aspirin 81 MG chewable tablet Chew 1 tablet (81 mg total) by mouth 2 (two) times daily. (Patient taking differently: Chew 81 mg by mouth daily.) 60 tablet 0  . b complex vitamins tablet Take 1 tablet by mouth daily.     . Cholecalciferol (VITAMIN D) 2000 units CAPS Take 2,000 Units by mouth daily.    Marland Kitchen EPINEPHrine 0.3 mg/0.3 mL IJ SOAJ injection Inject 0.3 mg into the muscle as needed for anaphylaxis.     Marland Kitchen ezetimibe (ZETIA) 10 MG tablet TAKE 1 TABLET BY MOUTH EVERY DAY 90 tablet 1  . fluticasone (FLONASE) 50 MCG/ACT nasal spray Place 1 spray into both nostrils daily.    . hydrocortisone cream 1 % Apply 1  application topically daily as needed for itching.    Marland Kitchen ketoconazole (NIZORAL) 2 % cream Apply 1 application topically daily as needed for irritation.    Marland Kitchen lisinopril-hydrochlorothiazide (ZESTORETIC) 20-12.5 MG tablet Take 1 tablet by mouth daily.    . Multiple Vitamin (MULTIVITAMIN) tablet Take 1 tablet by mouth daily.     . Multiple Vitamins-Minerals (PRESERVISION AREDS 2 PO) Take 1 capsule by mouth in the morning and at bedtime.    . potassium chloride SA (KLOR-CON M15) 15 MEQ tablet Take 30 mEq by mouth 2 (two) times daily.    Marland Kitchen PRESCRIPTION MEDICATION Inject 1 each into the skin every 30 (thirty) days. Allergy shot    .  psyllium (METAMUCIL) 58.6 % packet Take 1 packet by mouth daily.    . tamsulosin (FLOMAX) 0.4 MG CAPS capsule Take 0.4 mg by mouth every evening.    . traMADol (ULTRAM) 50 MG tablet Take 1-2 tablets (50-100 mg total) by mouth every 6 (six) hours as needed for moderate pain. 15 tablet 0   No current facility-administered medications for this visit.    Allergies:   Dexlansoprazole    ROS:  Please see the history of present illness.   Otherwise, review of systems are positive for none.   All other systems are reviewed and negative.    PHYSICAL EXAM: VS:  BP 130/60   Pulse (!) 59   Ht '5\' 7"'  (1.702 m)   Wt 183 lb 6.4 oz (83.2 kg)   SpO2 96%   BMI 28.72 kg/m  , BMI Body mass index is 28.72 kg/m.  GENERAL:  Well appearing NECK:  No jugular venous distention, waveform within normal limits, carotid upstroke brisk and symmetric, no bruits, no thyromegaly LUNGS:  Clear to auscultation bilaterally CHEST:  Unremarkable HEART:  PMI not displaced or sustained,S1 and S2 within normal limits, no S3, no S4, no clicks, no rubs, no murmurs ABD:  Flat, positive bowel sounds normal in frequency in pitch, no bruits, no rebound, no guarding, no midline pulsatile mass, no hepatomegaly, no splenomegaly EXT:  2 plus pulses throughout, no edema, no cyanosis no clubbing   EKG:  EKG is  ordered today. The ekg ordered today demonstrates sinus rhythm, rate 59, axis within normal limits, intervals within normal limits, no acute ST-T wave changes. PVCs.   09/09/2020    Wt Readings from Last 3 Encounters:  09/09/20 183 lb 6.4 oz (83.2 kg)  07/06/20 185 lb (83.9 kg)  11/05/19 185 lb 12.8 oz (84.3 kg)    No results found for: CHOL, TRIG, HDL, LDLCALC, LDLDIRECT   Other studies Reviewed: Additional studies/ records that were reviewed today include: Labs Review of the above records demonstrates: NA  ASSESSMENT AND PLAN:   PREOP:   The patient has no high risk features or symptoms.  He had no new symptoms  and stress testing in 2016.  He is not going for high risk procedure.  Therefore, according to ACC/AHA guidelines no further testing is indicated.  No change in therapy.  AORTIC ATHEROSCLEROSIS:   We discussed at length primary risk reduction.  I would suggest an LDL goal less than 100 and his was 100 last year.  He is intolerant of statin.  I will continue the Zetia.  We talked about a plant-based diet.   HTN:   His blood pressure is controlled.  No change in therapy.   Current medicines are reviewed at length with the patient today.  The patient does not have concerns regarding  medicines.  The following changes have been made:  None  Labs/ tests ordered today include:   None  No orders of the defined types were placed in this encounter.    Disposition:   FU with me as needed  Signed, Minus Breeding, MD  09/09/2020 4:28 PM    [facility]

## 2020-09-09 ENCOUNTER — Other Ambulatory Visit: Payer: Self-pay

## 2020-09-09 ENCOUNTER — Encounter: Payer: Self-pay | Admitting: Cardiology

## 2020-09-09 ENCOUNTER — Ambulatory Visit: Payer: PPO | Admitting: Cardiology

## 2020-09-09 VITALS — BP 130/60 | HR 59 | Ht 67.0 in | Wt 183.4 lb

## 2020-09-09 DIAGNOSIS — I700 Atherosclerosis of aorta: Secondary | ICD-10-CM | POA: Diagnosis not present

## 2020-09-09 DIAGNOSIS — I10 Essential (primary) hypertension: Secondary | ICD-10-CM | POA: Diagnosis not present

## 2020-09-09 MED ORDER — EZETIMIBE 10 MG PO TABS: 10.0000 mg | ORAL_TABLET | Freq: Every day | ORAL | 3 refills | Status: DC

## 2020-09-09 NOTE — Patient Instructions (Signed)
Medication Instructions:  ?Your physician recommends that you continue on your current medications as directed. Please refer to the Current Medication list given to you today.  ? ?Labwork: ?NONE ? ?Testing/Procedures: ?NONE ? ?Follow-Up: ?AS NEEDED  ? ?  ?

## 2020-09-10 DIAGNOSIS — J3089 Other allergic rhinitis: Secondary | ICD-10-CM | POA: Diagnosis not present

## 2020-09-10 DIAGNOSIS — J301 Allergic rhinitis due to pollen: Secondary | ICD-10-CM | POA: Diagnosis not present

## 2020-09-14 ENCOUNTER — Ambulatory Visit: Payer: PPO | Admitting: Cardiology

## 2020-09-15 NOTE — Telephone Encounter (Signed)
Notes from Dr. [name] on 09/09/20 faxed to surgeon. This phone note will be removed from the preop pool. Richardson Dopp, PA-C  09/15/2020 1:12 PM

## 2020-09-15 NOTE — Telephone Encounter (Signed)
  Patient called to follow up if clearance has been sent to requesting doctor.

## 2020-09-17 ENCOUNTER — Ambulatory Visit: Payer: PPO | Admitting: Cardiology

## 2020-09-22 DIAGNOSIS — Z23 Encounter for immunization: Secondary | ICD-10-CM | POA: Diagnosis not present

## 2020-10-07 ENCOUNTER — Other Ambulatory Visit: Payer: Self-pay | Admitting: Orthopedic Surgery

## 2020-10-08 DIAGNOSIS — J301 Allergic rhinitis due to pollen: Secondary | ICD-10-CM | POA: Diagnosis not present

## 2020-10-08 DIAGNOSIS — J3089 Other allergic rhinitis: Secondary | ICD-10-CM | POA: Diagnosis not present

## 2020-10-12 DIAGNOSIS — E785 Hyperlipidemia, unspecified: Secondary | ICD-10-CM | POA: Diagnosis not present

## 2020-10-12 DIAGNOSIS — Z0000 Encounter for general adult medical examination without abnormal findings: Secondary | ICD-10-CM | POA: Diagnosis not present

## 2020-10-12 DIAGNOSIS — R7301 Impaired fasting glucose: Secondary | ICD-10-CM | POA: Diagnosis not present

## 2020-10-12 DIAGNOSIS — Z125 Encounter for screening for malignant neoplasm of prostate: Secondary | ICD-10-CM | POA: Diagnosis not present

## 2020-10-15 DIAGNOSIS — C44319 Basal cell carcinoma of skin of other parts of face: Secondary | ICD-10-CM | POA: Diagnosis not present

## 2020-10-19 DIAGNOSIS — E785 Hyperlipidemia, unspecified: Secondary | ICD-10-CM | POA: Diagnosis not present

## 2020-10-19 DIAGNOSIS — I10 Essential (primary) hypertension: Secondary | ICD-10-CM | POA: Diagnosis not present

## 2020-10-19 DIAGNOSIS — G250 Essential tremor: Secondary | ICD-10-CM | POA: Diagnosis not present

## 2020-10-19 DIAGNOSIS — R82998 Other abnormal findings in urine: Secondary | ICD-10-CM | POA: Diagnosis not present

## 2020-10-19 DIAGNOSIS — I700 Atherosclerosis of aorta: Secondary | ICD-10-CM | POA: Diagnosis not present

## 2020-10-19 DIAGNOSIS — R7301 Impaired fasting glucose: Secondary | ICD-10-CM | POA: Diagnosis not present

## 2020-10-19 DIAGNOSIS — M25561 Pain in right knee: Secondary | ICD-10-CM | POA: Diagnosis not present

## 2020-10-19 DIAGNOSIS — Z1389 Encounter for screening for other disorder: Secondary | ICD-10-CM | POA: Diagnosis not present

## 2020-10-19 DIAGNOSIS — Z1212 Encounter for screening for malignant neoplasm of rectum: Secondary | ICD-10-CM | POA: Diagnosis not present

## 2020-10-19 DIAGNOSIS — Z0000 Encounter for general adult medical examination without abnormal findings: Secondary | ICD-10-CM | POA: Diagnosis not present

## 2020-10-19 DIAGNOSIS — Z1331 Encounter for screening for depression: Secondary | ICD-10-CM | POA: Diagnosis not present

## 2020-10-19 DIAGNOSIS — Z96651 Presence of right artificial knee joint: Secondary | ICD-10-CM | POA: Diagnosis not present

## 2020-10-19 DIAGNOSIS — N400 Enlarged prostate without lower urinary tract symptoms: Secondary | ICD-10-CM | POA: Diagnosis not present

## 2020-10-19 DIAGNOSIS — G729 Myopathy, unspecified: Secondary | ICD-10-CM | POA: Diagnosis not present

## 2020-11-05 DIAGNOSIS — J301 Allergic rhinitis due to pollen: Secondary | ICD-10-CM | POA: Diagnosis not present

## 2020-11-05 DIAGNOSIS — J3089 Other allergic rhinitis: Secondary | ICD-10-CM | POA: Diagnosis not present

## 2020-11-06 NOTE — Patient Instructions (Addendum)
DUE TO COVID-19 ONLY ONE VISITOR IS ALLOWED TO COME WITH YOU AND STAY IN THE WAITING ROOM ONLY DURING PRE OP AND PROCEDURE DAY OF SURGERY. THE 2 VISITORS  MAY VISIT WITH YOU AFTER SURGERY IN YOUR PRIVATE ROOM DURING VISITING HOURS ONLY!  YOU NEED TO HAVE A COVID 19 TEST ON_7/28______ @_10 :05______, THIS TEST MUST BE DONE BEFORE SURGERY,  COVID TESTING SITE [place] [address], IT IS ON THE RIGHT GOING OUT [address] APPROXIMATELY  2 MINUTES PAST [employer] ON THE RIGHT. ONCE YOUR COVID TEST IS COMPLETED,  PLEASE BEGIN THE QUARANTINE INSTRUCTIONS AS OUTLINED IN YOUR HANDOUT.                Ross [name].     Your procedure is scheduled on: 11/23/20   Report to [facility] Main  Entrance   Report to admitting at  8:55 AM     Call this number if you have problems the morning of surgery Ross [name], NO CHEWING GUM Ross [name].   No food after midnight.    You may have clear liquid until 8:00 AM.    At 7:30 AM drink pre surgery drink.   Nothing by mouth after 8:00 AM.    Take these medicines the morning of surgery with A SIP OF WATER: Tamsulosin                                You may not have any metal on your body including              piercings  Do not wear jewelry,  lotions, powders or deodorant                         Men may shave face and neck.   Do not bring valuables to the hospital. Ross [name].  Contacts, dentures or bridgework may not be worn into surgery.                    Please read over the following fact sheets you were given: _____________________________________________________________________             [facility] - Preparing for Surgery Before surgery, you can play an important role.  Because skin is not sterile, your skin needs to be as free of germs as possible.  You can reduce the  number of germs on your skin by washing with CHG (chlorahexidine gluconate) soap before surgery.  CHG is an antiseptic cleaner which kills germs and bonds with the skin to continue killing germs even after washing. Please DO NOT use if you have an allergy to CHG or antibacterial soaps.  If your skin becomes reddened/irritated stop using the CHG and inform your nurse when you arrive at Short Stay.  You may shave your face/neck.  Please follow these instructions carefully:  1.  Shower with CHG Soap the night before surgery and the  morning of Surgery.  2.  If you choose to wash your hair, wash your hair first as usual with your  normal  shampoo.  3.  After you shampoo, rinse your hair and body thoroughly to remove the  shampoo.  4.  Use CHG as you would any other liquid soap.  You can apply chg directly  to the skin and wash                       Gently with a scrungie or clean washcloth.  5.  Apply the CHG Soap to your body ONLY FROM THE NECK DOWN.   Do not use on face/ open                           Wound or open sores. Avoid contact with eyes, ears mouth and genitals (private parts).                       Wash face,  Genitals (private parts) with your normal soap.             6.  Wash thoroughly, paying special attention to the area where your surgery  will be performed.  7.  Thoroughly rinse your body with warm water from the neck down.  8.  DO NOT shower/wash with your normal soap after using and rinsing off  the CHG Soap.            9.  Pat yourself dry with a clean towel.            10.  Wear clean pajamas.            11.  Place clean sheets on your bed the night of your first shower and do not  sleep with pets. Day of Surgery : Do not apply any lotions/deodorants the morning of surgery.  Please wear clean clothes to the hospital/surgery center.  FAILURE TO FOLLOW THESE INSTRUCTIONS MAY RESULT IN THE CANCELLATION OF YOUR SURGERY PATIENT  SIGNATURE_________________________________  NURSE SIGNATURE__________________________________  ________________________________________________________________________   Ross [name]  An incentive spirometer is a tool that can help keep your lungs clear and active. This tool measures how well you are filling your lungs with each breath. Taking long deep breaths may help reverse or decrease the chance of developing breathing (pulmonary) problems (especially infection) following: A long period of time when you are unable to move or be active. BEFORE THE PROCEDURE  If the spirometer includes an indicator to show your best effort, your nurse or respiratory therapist will set it to a desired goal. If possible, sit up straight or lean slightly forward. Try not to slouch. Hold the incentive spirometer in an upright position. INSTRUCTIONS FOR USE  Sit on the edge of your bed if possible, or sit up as far as you can in bed or on a chair. Hold the incentive spirometer in an upright position. Breathe out normally. Place the mouthpiece in your mouth and seal your lips tightly around it. Breathe in slowly and as deeply as possible, raising the piston or the ball toward the top of the column. Hold your breath for 3-5 seconds or for as long as possible. Allow the piston or ball to fall to the bottom of the column. Remove the mouthpiece from your mouth and breathe out normally. Rest for a few seconds and repeat Steps 1 through 7 at least 10 times every 1-2 hours when you are awake. Take your time and take a few normal breaths between deep breaths. The spirometer may include an indicator to show your best effort. Use the indicator as a goal to work toward during each repetition. After each  set of 10 deep breaths, practice coughing to be sure your lungs are clear. If you have an incision (the cut made at the time of surgery), support your incision when coughing by placing a pillow or rolled up towels  firmly against it. Once you are able to get out of bed, walk around indoors and cough well. You may stop using the incentive spirometer when instructed by your caregiver.  RISKS AND COMPLICATIONS Take your time so you do not get dizzy or light-headed. If you are in pain, you may need to take or ask for pain medication before doing incentive spirometry. It is harder to take a deep breath if you are having pain. AFTER USE Rest and breathe slowly and easily. It can be helpful to keep track of a log of your progress. Your caregiver can provide you with a simple table to help with this. If you are using the spirometer at home, follow these instructions: [place] IF:  You are having difficultly using the spirometer. You have trouble using the spirometer as often as instructed. Your pain medication is not giving enough relief while using the spirometer. You develop fever of 100.5 F (38.1 C) or higher. SEEK IMMEDIATE MEDICAL CARE IF:  You cough up bloody sputum that had not been present before. You develop fever of 102 F (38.9 C) or greater. You develop worsening pain at or near the incision site. MAKE SURE YOU:  Understand these instructions. Will watch your condition. Will get help right away if you are not doing well or get worse. Document Released: 08/22/2006 Document Revised: 07/04/2011 Document Reviewed: 10/23/2006 [facility] Patient Information 2014 [place], [place].   ________________________________________________________________________

## 2020-11-09 ENCOUNTER — Encounter (HOSPITAL_COMMUNITY)
Admission: RE | Admit: 2020-11-09 | Discharge: 2020-11-09 | Disposition: A | Discharge status: Home / Self Care | Payer: PPO | Source: Ambulatory Visit | Attending: Orthopedic Surgery | Admitting: Orthopedic Surgery

## 2020-11-09 ENCOUNTER — Encounter (HOSPITAL_COMMUNITY): Payer: Self-pay

## 2020-11-09 ENCOUNTER — Other Ambulatory Visit: Payer: Self-pay

## 2020-11-09 DIAGNOSIS — Z01812 Encounter for preprocedural laboratory examination: Secondary | ICD-10-CM | POA: Diagnosis not present

## 2020-11-09 LAB — CBC WITH DIFFERENTIAL/PLATELET
Abs Immature Granulocytes: 0.01 10*3/uL (ref 0.00–0.07)
Basophils Absolute: 0.1 10*3/uL (ref 0.0–0.1)
Basophils Relative: 1 %
Eosinophils Absolute: 0.2 10*3/uL (ref 0.0–0.5)
Eosinophils Relative: 2 %
HCT: 47.3 % (ref 39.0–52.0)
Hemoglobin: 16.2 g/dL (ref 13.0–17.0)
Immature Granulocytes: 0 %
Lymphocytes Relative: 22 %
Lymphs Abs: 1.5 10*3/uL (ref 0.7–4.0)
MCH: 32.5 pg (ref 26.0–34.0)
MCHC: 34.2 g/dL (ref 30.0–36.0)
MCV: 95 fL (ref 80.0–100.0)
Monocytes Absolute: 0.7 10*3/uL (ref 0.1–1.0)
Monocytes Relative: 11 %
Neutro Abs: 4.2 10*3/uL (ref 1.7–7.7)
Neutrophils Relative %: 64 %
Platelets: 199 10*3/uL (ref 150–400)
RBC: 4.98 MIL/uL (ref 4.22–5.81)
RDW: 13.2 % (ref 11.5–15.5)
WBC: 6.6 10*3/uL (ref 4.0–10.5)
nRBC: 0 % (ref 0.0–0.2)

## 2020-11-09 LAB — COMPREHENSIVE METABOLIC PANEL
ALT: 27 U/L (ref 0–44)
AST: 26 U/L (ref 15–41)
Albumin: 3.9 g/dL (ref 3.5–5.0)
Alkaline Phosphatase: 46 U/L (ref 38–126)
Anion gap: 8 (ref 5–15)
BUN: 25 mg/dL — ABNORMAL HIGH (ref 8–23)
CO2: 26 mmol/L (ref 22–32)
Calcium: 9.4 mg/dL (ref 8.9–10.3)
Chloride: 104 mmol/L (ref 98–111)
Creatinine, Ser: 0.85 mg/dL (ref 0.61–1.24)
GFR, Estimated: >60 mL/min (ref >60)
Glucose, Bld: 107 mg/dL — ABNORMAL HIGH (ref 70–99)
Potassium: 4.4 mmol/L (ref 3.5–5.1)
Sodium: 138 mmol/L (ref 135–145)
Total Bilirubin: 0.8 mg/dL (ref 0.3–1.2)
Total Protein: 6.8 g/dL (ref 6.5–8.1)

## 2020-11-09 LAB — SURGICAL PCR SCREEN
MRSA, PCR: NEGATIVE
Staphylococcus aureus: NEGATIVE

## 2020-11-09 NOTE — Progress Notes (Addendum)
COVID Vaccine Completed:yes Date COVID Vaccine completed:05/31/19-boosters 02/04/20, 08/25/20 COVID vaccine manufacturer: Pfizer     PCP - Dr. Dione Housekeeper 09/25/19 Cardiologist - Dr. Lenna Sciara. [place]  LOV 09/09/20 clearance in Epic  Chest x-ray - no EKG - 09/09/20-epic Stress Test - 10/17/14-epic ECHO - no Cardiac Cath - no Pacemaker/ICD device last checked:NA  Sleep Study - no CPAP -   Fasting Blood Sugar - NA Checks Blood Sugar _____ times a day  Blood Thinner Instructions:ASA 81/ Dr. [name] Aspirin Instructions:none Pt will see Dr. Ruel Favors PA on 11/13/20 Last Dose:  Anesthesia review: yes  Patient denies shortness of breath, fever, cough and chest pain at PAT appointment Pt has been walking every day until his knee became painful. He has no SOB with any activities.  Patient verbalized understanding of instructions that were given to them at the PAT appointment. Patient was also instructed that they will need to review over the PAT instructions again at home before surgery. Yes  PAT visit was too far out for the T&S to be drawn. It will be done DOS.

## 2020-11-12 NOTE — Progress Notes (Signed)
Anesthesia Chart Review   Case: [id number] Date/Time: 11/23/20 1105   Procedure: SYNOVECTOMY WITH POLY EXCHANGE (Right)   Anesthesia type: Spinal   Pre-op diagnosis: Right knee arthofibrosis   Location: [facility] ROOM 08 / [facility]   Surgeons: Vickey Huger, MD       DISCUSSION:79 y.o. former smoker with h/o HTN, Stroke, right knee OA scheduled for above procedure 11/23/20 with Dr. Vickey Huger.   Pt last seen by cardiology 09/09/2020. Per OV note, "The patient has no high risk features or symptoms.  He had no new symptoms and stress testing in 2016.  He is not going for high risk procedure.  Therefore, according to ACC/AHA guidelines no further testing is indicated.  No change in therapy."  Anticipate pt can proceed with planned procedure barring acute status change.   VS: BP (!) 144/63   Pulse 73   Temp 36.6 C (Oral)   Resp 20   Ht 5' 7.5" (1.715 m)   Wt 82.1 kg   SpO2 96%   BMI 27.93 kg/m   PROVIDERS: Ginger Organ., MD is PCP   Minus Breeding, MD is Cardiologist  LABS: Labs reviewed: Acceptable for surgery. (all labs ordered are listed, but only abnormal results are displayed)  Labs Reviewed  COMPREHENSIVE METABOLIC PANEL - Abnormal; Notable for the following components:      Result Value   Glucose, Bld 107 (*)    BUN 25 (*)    All other components within normal limits  SURGICAL PCR SCREEN  CBC WITH DIFFERENTIAL/PLATELET     IMAGES:   EKG: 09/09/2020 Rate 59 bpm  Sinus bradycardia   CV:  Past Medical History:  Diagnosis Date   Cancer ([place])    skin basal cell   Essential and other specified forms of tremor 08/30/2013   History of kidney stones    Hypertension    Kidney stones    Stroke [facility]) 2005    Past Surgical History:  Procedure Laterality Date   APPENDECTOMY     CATARACT EXTRACTION     bilateral   CHOLECYSTECTOMY     CYSTOSCOPY WITH RETROGRADE PYELOGRAM, URETEROSCOPY AND STENT PLACEMENT Left 06/11/2013   Procedure: CYSTOSCOPY WITH RETROGRADE Left  PYELOGRAM, URETEROSCOPY AND Left STENT PLACEMENT;  Surgeon: Ailene Rud, MD;  Location[facility];  Service: Urology;  Laterality: Left;   EXTRACORPOREAL SHOCK WAVE LITHOTRIPSY Left 07/06/2020   Procedure: EXTRACORPOREAL SHOCK WAVE LITHOTRIPSY (ESWL);  Surgeon: Ardis Hughs, MD;  [facility];  Service: Urology;  Laterality: Left;   KNEE SURGERY     bilateral arthroscopic   LUMBAR LAMINECTOMY/DECOMPRESSION MICRODISCECTOMY N/A 11/11/2019   Procedure: Lumbar four-five Laminectomy for facet/synovial cyst;  Surgeon: Kary Kos, MD;  Location: [place];  Service: Neurosurgery;  Laterality: N/A;   SHOULDER SURGERY     right    TOTAL KNEE ARTHROPLASTY Right 12/06/2016   Procedure: RIGHT TOTAL KNEE ARTHROPLASTY;  Surgeon: Paralee Cancel, MD;  Location: [facility];  Service: Orthopedics;  Laterality: Right;  70 mins   TRIGGER FINGER RELEASE Bilateral     MEDICATIONS:  aspirin EC 81 MG tablet   b complex vitamins tablet   cholecalciferol (VITAMIN D3) 25 MCG (1000 UNIT) tablet   CINNAMON PO   EPINEPHrine 0.3 mg/0.3 mL IJ SOAJ injection   ezetimibe (ZETIA) 10 MG tablet   fluticasone (FLONASE) 50 MCG/ACT nasal spray   ketoconazole (NIZORAL) 2 % cream   lisinopril-hydrochlorothiazide (ZESTORETIC) 20-12.5 MG tablet   Multiple Vitamin (MULTIVITAMIN) tablet  Multiple Vitamins-Minerals (PRESERVISION AREDS 2 PO)   Potassium Citrate 15 MEQ (1620 MG) TBCR   PRESCRIPTION MEDICATION   Propylene Glycol (SYSTANE COMPLETE) 0.6 % SOLN   psyllium (METAMUCIL) 58.6 % packet   tamsulosin (FLOMAX) 0.4 MG CAPS capsule   traMADol (ULTRAM) 50 MG tablet   No current facility-administered medications for this encounter.    Konrad Felix, PA-C [facility] [phone]

## 2020-11-12 NOTE — Anesthesia Preprocedure Evaluation (Addendum)
Anesthesia Evaluation  Patient identified by MRN, date of birth, ID band Patient awake    Reviewed: Allergy & Precautions, NPO status , Patient's Chart, lab work & pertinent test results  History of Anesthesia Complications Negative for: history of anesthetic complications  Airway Mallampati: III  TM Distance: >3 FB Neck ROM: Full    Dental  (+) Dental Advisory Given   Pulmonary neg shortness of breath, neg sleep apnea, neg COPD, neg recent URI, former smoker,  Covid-19 Nucleic Acid Test Results Lab Results      Component                Value               Date                      [place]              NEGATIVE            11/19/2020                [place]              NEGATIVE            07/02/2020                [place]              NEGATIVE            11/08/2019              breath sounds clear to auscultation       Cardiovascular hypertension,  Rhythm:Regular  Pt last seen by cardiology 09/09/2020. Per OV note, "The patient has no high risk features or symptoms.  He had no new symptoms and stress testing in 2016.  He is not going for high risk procedure.  Therefore, according to ACC/AHA guidelines no further testing is indicated.  No change in therapy."   Neuro/Psych CVA negative psych ROS   GI/Hepatic negative GI ROS, Neg liver ROS,   Endo/Other  negative endocrine ROS  Renal/GU Renal disease     Musculoskeletal  (+) Arthritis ,   Abdominal   Peds  Hematology negative hematology ROS (+) Lab Results      Component                Value               Date                      WBC                      6.6                 11/09/2020                HGB                      16.2                11/09/2020                HCT                      47.3                11/09/2020  MCV                      95.0                11/09/2020                PLT                      199                  11/09/2020              Anesthesia Other Findings   Reproductive/Obstetrics                           Anesthesia Physical Anesthesia Plan  ASA: 2  Anesthesia Plan: MAC, Regional and Spinal   Post-op Pain Management:    Induction:   PONV Risk Score and Plan: 1 and Propofol infusion  Airway Management Planned: Nasal Cannula  Additional Equipment: None  Intra-op Plan:   Post-operative Plan:   Informed Consent: I have reviewed the patients History and Physical, chart, labs and discussed the procedure including the risks, benefits and alternatives for the proposed anesthesia with the patient or authorized representative who has indicated his/her understanding and acceptance.     Dental advisory given  Plan Discussed with: CRNA and Anesthesiologist  Anesthesia Plan Comments: (See PAT note 11/09/2020, Konrad Felix, PA-C)       Anesthesia Quick Evaluation

## 2020-11-19 ENCOUNTER — Other Ambulatory Visit (HOSPITAL_COMMUNITY)
Admission: RE | Admit: 2020-11-19 | Discharge: 2020-11-19 | Disposition: A | Discharge status: Home / Self Care | Payer: PPO | Source: Ambulatory Visit | Attending: Orthopedic Surgery | Admitting: Orthopedic Surgery

## 2020-11-19 DIAGNOSIS — Z01812 Encounter for preprocedural laboratory examination: Secondary | ICD-10-CM | POA: Diagnosis not present

## 2020-11-19 DIAGNOSIS — Z20822 Contact with and (suspected) exposure to covid-19: Secondary | ICD-10-CM | POA: Diagnosis not present

## 2020-11-19 LAB — SARS CORONAVIRUS 2 (TAT 6-24 HRS): SARS Coronavirus 2: NEGATIVE

## 2020-11-22 MED ORDER — BUPIVACAINE LIPOSOME 1.3 % IJ SUSP
20.0000 mL | INTRAMUSCULAR | Status: DC
  Filled 2020-11-22: qty 20

## 2020-11-23 ENCOUNTER — Ambulatory Visit (HOSPITAL_COMMUNITY): Payer: PPO | Admitting: Certified Registered"

## 2020-11-23 ENCOUNTER — Ambulatory Visit (HOSPITAL_COMMUNITY): Payer: PPO | Admitting: Physician Assistant

## 2020-11-23 ENCOUNTER — Encounter (HOSPITAL_COMMUNITY)
Admission: RE | Disposition: A | Discharge status: Home / Self Care | Payer: Self-pay | Source: Ambulatory Visit | Attending: Orthopedic Surgery

## 2020-11-23 ENCOUNTER — Observation Stay (HOSPITAL_COMMUNITY)
Admission: RE | Admit: 2020-11-23 | Discharge: 2020-11-24 | Disposition: A | Discharge status: Home / Self Care | Payer: PPO | Source: Ambulatory Visit | Attending: Orthopedic Surgery | Admitting: Orthopedic Surgery

## 2020-11-23 ENCOUNTER — Other Ambulatory Visit: Payer: Self-pay

## 2020-11-23 ENCOUNTER — Encounter (HOSPITAL_COMMUNITY): Payer: Self-pay | Admitting: Orthopedic Surgery

## 2020-11-23 DIAGNOSIS — Z96651 Presence of right artificial knee joint: Secondary | ICD-10-CM | POA: Diagnosis not present

## 2020-11-23 DIAGNOSIS — Z96659 Presence of unspecified artificial knee joint: Secondary | ICD-10-CM

## 2020-11-23 DIAGNOSIS — Z8673 Personal history of transient ischemic attack (TIA), and cerebral infarction without residual deficits: Secondary | ICD-10-CM | POA: Diagnosis not present

## 2020-11-23 DIAGNOSIS — T8482XA Fibrosis due to internal orthopedic prosthetic devices, implants and grafts, initial encounter: Secondary | ICD-10-CM | POA: Diagnosis not present

## 2020-11-23 DIAGNOSIS — I10 Essential (primary) hypertension: Secondary | ICD-10-CM | POA: Diagnosis not present

## 2020-11-23 DIAGNOSIS — Z87891 Personal history of nicotine dependence: Secondary | ICD-10-CM | POA: Insufficient documentation

## 2020-11-23 DIAGNOSIS — Z85828 Personal history of other malignant neoplasm of skin: Secondary | ICD-10-CM | POA: Diagnosis not present

## 2020-11-23 DIAGNOSIS — M24661 Ankylosis, right knee: Principal | ICD-10-CM | POA: Insufficient documentation

## 2020-11-23 DIAGNOSIS — N289 Disorder of kidney and ureter, unspecified: Secondary | ICD-10-CM | POA: Diagnosis not present

## 2020-11-23 DIAGNOSIS — G8918 Other acute postprocedural pain: Secondary | ICD-10-CM | POA: Diagnosis not present

## 2020-11-23 HISTORY — PX: SYNOVECTOMY WITH POLY EXCHANGE: SHX6746

## 2020-11-23 LAB — TYPE AND SCREEN
ABO/RH(D): O POS
Antibody Screen: NEGATIVE

## 2020-11-23 SURGERY — SYNOVECTOMY WITH POLY EXCHANGE
Anesthesia: Monitor Anesthesia Care | Site: Knee | Laterality: Right

## 2020-11-23 MED ORDER — FLEET ENEMA 7-19 GM/118ML RE ENEM: 1.0000 | ENEMA | Freq: Once | RECTAL | Status: DC | PRN

## 2020-11-23 MED ORDER — DEXAMETHASONE SODIUM PHOSPHATE 10 MG/ML IJ SOLN
8.0000 mg | Freq: Once | INTRAMUSCULAR | Status: AC
  Administered 2020-11-23: 8 mg via INTRAVENOUS

## 2020-11-23 MED ORDER — EPHEDRINE SULFATE-NACL 50-0.9 MG/10ML-% IV SOSY
PREFILLED_SYRINGE | INTRAVENOUS | Status: DC | PRN
  Administered 2020-11-23: 10 mg via INTRAVENOUS

## 2020-11-23 MED ORDER — ACETAMINOPHEN 500 MG PO TABS
1000.0000 mg | ORAL_TABLET | Freq: Four times a day (QID) | ORAL | Status: AC
  Administered 2020-11-23 – 2020-11-24 (×4): 1000 mg via ORAL
  Filled 2020-11-23 (×4): qty 2

## 2020-11-23 MED ORDER — CHLORHEXIDINE GLUCONATE 0.12 % MT SOLN
15.0000 mL | Freq: Once | OROMUCOSAL | Status: AC
  Administered 2020-11-23: 15 mL via OROMUCOSAL

## 2020-11-23 MED ORDER — PROPOFOL 10 MG/ML IV BOLUS
INTRAVENOUS | Status: DC | PRN
  Administered 2020-11-23: 20 mg via INTRAVENOUS
  Administered 2020-11-23: 10 mg via INTRAVENOUS

## 2020-11-23 MED ORDER — LISINOPRIL 20 MG PO TABS
20.0000 mg | ORAL_TABLET | Freq: Every day | ORAL | Status: DC
  Administered 2020-11-23 – 2020-11-24 (×2): 20 mg via ORAL
  Filled 2020-11-23 (×2): qty 1

## 2020-11-23 MED ORDER — ACETAMINOPHEN 160 MG/5ML PO SOLN: 1000.0000 mg | Freq: Once | ORAL | Status: DC | PRN

## 2020-11-23 MED ORDER — METOCLOPRAMIDE HCL 5 MG PO TABS: 5.0000 mg | ORAL_TABLET | Freq: Three times a day (TID) | ORAL | Status: DC | PRN

## 2020-11-23 MED ORDER — PHENOL 1.4 % MT LIQD: 1.0000 | OROMUCOSAL | Status: DC | PRN

## 2020-11-23 MED ORDER — SODIUM CHLORIDE 0.9 % IV SOLN
2.0000 g | INTRAVENOUS | Status: AC
  Administered 2020-11-23: 2 g via INTRAVENOUS
  Filled 2020-11-23: qty 2

## 2020-11-23 MED ORDER — FENTANYL CITRATE (PF) 100 MCG/2ML IJ SOLN
50.0000 ug | INTRAMUSCULAR | Status: AC
  Administered 2020-11-23: 75 ug via INTRAVENOUS
  Filled 2020-11-23: qty 2

## 2020-11-23 MED ORDER — FENTANYL CITRATE (PF) 100 MCG/2ML IJ SOLN: 25.0000 ug | INTRAMUSCULAR | Status: DC | PRN

## 2020-11-23 MED ORDER — METHOCARBAMOL 500 MG PO TABS
500.0000 mg | ORAL_TABLET | Freq: Four times a day (QID) | ORAL | Status: DC | PRN
  Administered 2020-11-23 – 2020-11-24 (×3): 500 mg via ORAL
  Filled 2020-11-23 (×3): qty 1

## 2020-11-23 MED ORDER — EPHEDRINE 5 MG/ML INJ
INTRAVENOUS | Status: AC
  Filled 2020-11-23: qty 5

## 2020-11-23 MED ORDER — OXYCODONE HCL 5 MG PO TABS: 5.0000 mg | ORAL_TABLET | Freq: Once | ORAL | Status: DC | PRN

## 2020-11-23 MED ORDER — ONDANSETRON HCL 4 MG PO TABS: 4.0000 mg | ORAL_TABLET | Freq: Four times a day (QID) | ORAL | Status: DC | PRN

## 2020-11-23 MED ORDER — BUPIVACAINE-EPINEPHRINE (PF) 0.25% -1:200000 IJ SOLN
INTRAMUSCULAR | Status: AC
  Filled 2020-11-23: qty 30

## 2020-11-23 MED ORDER — DOCUSATE SODIUM 100 MG PO CAPS
100.0000 mg | ORAL_CAPSULE | Freq: Two times a day (BID) | ORAL | Status: DC
  Administered 2020-11-23 – 2020-11-24 (×2): 100 mg via ORAL
  Filled 2020-11-23 (×2): qty 1

## 2020-11-23 MED ORDER — ASPIRIN EC 325 MG PO TBEC
325.0000 mg | DELAYED_RELEASE_TABLET | Freq: Two times a day (BID) | ORAL | Status: DC
  Administered 2020-11-24: 325 mg via ORAL
  Filled 2020-11-23: qty 1

## 2020-11-23 MED ORDER — ONDANSETRON HCL 4 MG/2ML IJ SOLN
INTRAMUSCULAR | Status: DC | PRN
  Administered 2020-11-23: 4 mg via INTRAVENOUS

## 2020-11-23 MED ORDER — EZETIMIBE 10 MG PO TABS
10.0000 mg | ORAL_TABLET | Freq: Every day | ORAL | Status: DC
  Administered 2020-11-23 – 2020-11-24 (×2): 10 mg via ORAL
  Filled 2020-11-23 (×2): qty 1

## 2020-11-23 MED ORDER — FERROUS SULFATE 325 (65 FE) MG PO TABS
325.0000 mg | ORAL_TABLET | Freq: Three times a day (TID) | ORAL | Status: DC
  Administered 2020-11-23 – 2020-11-24 (×3): 325 mg via ORAL
  Filled 2020-11-23 (×3): qty 1

## 2020-11-23 MED ORDER — ONDANSETRON HCL 4 MG/2ML IJ SOLN
INTRAMUSCULAR | Status: AC
  Filled 2020-11-23: qty 2

## 2020-11-23 MED ORDER — POTASSIUM CITRATE ER 15 MEQ (1620 MG) PO TBCR: 2.0000 | EXTENDED_RELEASE_TABLET | Freq: Every day | ORAL | Status: DC

## 2020-11-23 MED ORDER — ALUM & MAG HYDROXIDE-SIMETH 200-200-20 MG/5ML PO SUSP
30.0000 mL | ORAL | Status: DC | PRN
  Administered 2020-11-24 (×2): 30 mL via ORAL
  Filled 2020-11-23 (×2): qty 30

## 2020-11-23 MED ORDER — DEXAMETHASONE SODIUM PHOSPHATE 10 MG/ML IJ SOLN
10.0000 mg | Freq: Once | INTRAMUSCULAR | Status: AC
  Administered 2020-11-24: 10 mg via INTRAVENOUS
  Filled 2020-11-23: qty 1

## 2020-11-23 MED ORDER — METOCLOPRAMIDE HCL 5 MG/ML IJ SOLN: 5.0000 mg | Freq: Three times a day (TID) | INTRAMUSCULAR | Status: DC | PRN

## 2020-11-23 MED ORDER — SODIUM CHLORIDE 0.9 % IV SOLN: INTRAVENOUS | Status: DC

## 2020-11-23 MED ORDER — MIDAZOLAM HCL 2 MG/2ML IJ SOLN
1.0000 mg | INTRAMUSCULAR | Status: DC
  Filled 2020-11-23: qty 2

## 2020-11-23 MED ORDER — BUPIVACAINE-EPINEPHRINE 0.25% -1:200000 IJ SOLN
INTRAMUSCULAR | Status: DC | PRN
  Administered 2020-11-23: 30 mL

## 2020-11-23 MED ORDER — LACTATED RINGERS IV SOLN: INTRAVENOUS | Status: DC

## 2020-11-23 MED ORDER — GABAPENTIN 300 MG PO CAPS
300.0000 mg | ORAL_CAPSULE | Freq: Once | ORAL | Status: AC
  Administered 2020-11-23: 300 mg via ORAL
  Filled 2020-11-23: qty 1

## 2020-11-23 MED ORDER — ZOLPIDEM TARTRATE 5 MG PO TABS
5.0000 mg | ORAL_TABLET | Freq: Every evening | ORAL | Status: DC | PRN
  Administered 2020-11-23: 5 mg via ORAL
  Filled 2020-11-23 (×2): qty 1

## 2020-11-23 MED ORDER — PROPOFOL 500 MG/50ML IV EMUL
INTRAVENOUS | Status: DC | PRN
  Administered 2020-11-23: 75 ug/kg/min via INTRAVENOUS

## 2020-11-23 MED ORDER — DIPHENHYDRAMINE HCL 12.5 MG/5ML PO ELIX: 12.5000 mg | ORAL_SOLUTION | ORAL | Status: DC | PRN

## 2020-11-23 MED ORDER — SODIUM CHLORIDE 0.9 % IV SOLN
INTRAVENOUS | Status: DC | PRN
  Administered 2020-11-23: 40 mL

## 2020-11-23 MED ORDER — HYDROMORPHONE HCL 1 MG/ML IJ SOLN: 0.5000 mg | INTRAMUSCULAR | Status: DC | PRN

## 2020-11-23 MED ORDER — ONDANSETRON HCL 4 MG/2ML IJ SOLN: 4.0000 mg | Freq: Four times a day (QID) | INTRAMUSCULAR | Status: DC | PRN

## 2020-11-23 MED ORDER — LISINOPRIL-HYDROCHLOROTHIAZIDE 20-12.5 MG PO TABS: 1.0000 | ORAL_TABLET | Freq: Every day | ORAL | Status: DC

## 2020-11-23 MED ORDER — ACETAMINOPHEN 500 MG PO TABS
1000.0000 mg | ORAL_TABLET | Freq: Once | ORAL | Status: AC
  Administered 2020-11-23: 1000 mg via ORAL
  Filled 2020-11-23: qty 2

## 2020-11-23 MED ORDER — MENTHOL 3 MG MT LOZG: 1.0000 | LOZENGE | OROMUCOSAL | Status: DC | PRN

## 2020-11-23 MED ORDER — ORAL CARE MOUTH RINSE: 15.0000 mL | Freq: Once | OROMUCOSAL | Status: AC

## 2020-11-23 MED ORDER — PANTOPRAZOLE SODIUM 40 MG PO TBEC: 40.0000 mg | DELAYED_RELEASE_TABLET | Freq: Every day | ORAL | Status: DC

## 2020-11-23 MED ORDER — METHOCARBAMOL 1000 MG/10ML IJ SOLN
500.0000 mg | Freq: Four times a day (QID) | INTRAVENOUS | Status: DC | PRN
  Filled 2020-11-23: qty 5

## 2020-11-23 MED ORDER — TRANEXAMIC ACID-NACL 1000-0.7 MG/100ML-% IV SOLN
1000.0000 mg | INTRAVENOUS | Status: AC
  Administered 2020-11-23: 1000 mg via INTRAVENOUS
  Filled 2020-11-23: qty 100

## 2020-11-23 MED ORDER — SENNOSIDES-DOCUSATE SODIUM 8.6-50 MG PO TABS: 1.0000 | ORAL_TABLET | Freq: Every evening | ORAL | Status: DC | PRN

## 2020-11-23 MED ORDER — ACETAMINOPHEN 10 MG/ML IV SOLN: 1000.0000 mg | Freq: Once | INTRAVENOUS | Status: DC | PRN

## 2020-11-23 MED ORDER — TAMSULOSIN HCL 0.4 MG PO CAPS
0.4000 mg | ORAL_CAPSULE | Freq: Every evening | ORAL | Status: DC
  Administered 2020-11-23: 0.4 mg via ORAL
  Filled 2020-11-23: qty 1

## 2020-11-23 MED ORDER — OXYCODONE HCL 5 MG/5ML PO SOLN: 5.0000 mg | Freq: Once | ORAL | Status: DC | PRN

## 2020-11-23 MED ORDER — HYDROCHLOROTHIAZIDE 12.5 MG PO CAPS
12.5000 mg | ORAL_CAPSULE | Freq: Every day | ORAL | Status: DC
  Administered 2020-11-23 – 2020-11-24 (×2): 12.5 mg via ORAL
  Filled 2020-11-23 (×2): qty 1

## 2020-11-23 MED ORDER — BISACODYL 5 MG PO TBEC: 5.0000 mg | DELAYED_RELEASE_TABLET | Freq: Every day | ORAL | Status: DC | PRN

## 2020-11-23 MED ORDER — PROPOFOL 10 MG/ML IV BOLUS
INTRAVENOUS | Status: AC
  Filled 2020-11-23: qty 20

## 2020-11-23 MED ORDER — POVIDONE-IODINE 10 % EX SWAB: 2.0000 "application " | Freq: Once | CUTANEOUS | Status: DC

## 2020-11-23 MED ORDER — DEXAMETHASONE SODIUM PHOSPHATE 10 MG/ML IJ SOLN
INTRAMUSCULAR | Status: AC
  Filled 2020-11-23: qty 1

## 2020-11-23 MED ORDER — ACETAMINOPHEN 500 MG PO TABS: 1000.0000 mg | ORAL_TABLET | Freq: Once | ORAL | Status: DC | PRN

## 2020-11-23 MED ORDER — OXYCODONE HCL 5 MG PO TABS
5.0000 mg | ORAL_TABLET | ORAL | Status: DC | PRN
  Administered 2020-11-23 – 2020-11-24 (×5): 5 mg via ORAL
  Filled 2020-11-23: qty 2
  Filled 2020-11-23: qty 1
  Filled 2020-11-23: qty 2
  Filled 2020-11-23 (×2): qty 1

## 2020-11-23 MED ORDER — BUPIVACAINE IN DEXTROSE 0.75-8.25 % IT SOLN
INTRATHECAL | Status: DC | PRN
  Administered 2020-11-23: 1.6 mL via INTRATHECAL

## 2020-11-23 SURGICAL SUPPLY — 39 items
ATTUNE PSRP INSR SZ7 5 KNEE (Insert) ×1 IMPLANT
BAG COUNTER SPONGE SURGICOUNT (BAG) IMPLANT
BAG SPEC THK2 15X12 ZIP CLS (MISCELLANEOUS) ×1
BAG SPNG CNTER NS LX DISP (BAG)
BAG ZIPLOCK 12X15 (MISCELLANEOUS) ×2 IMPLANT
BNDG ELASTIC 6X5.8 VLCR STR LF (GAUZE/BANDAGES/DRESSINGS) ×2 IMPLANT
COVER SURGICAL LIGHT HANDLE (MISCELLANEOUS) ×2 IMPLANT
CUFF TOURN SGL QUICK 34 (TOURNIQUET CUFF) ×4
CUFF TRNQT CYL 34X4.125X (TOURNIQUET CUFF) ×2 IMPLANT
DECANTER SPIKE VIAL GLASS SM (MISCELLANEOUS) ×4 IMPLANT
DRAPE INCISE IOBAN 66X45 STRL (DRAPES) ×4 IMPLANT
DRAPE U-SHAPE 47X51 STRL (DRAPES) ×2 IMPLANT
DRSG AQUACEL AG ADV 3.5X10 (GAUZE/BANDAGES/DRESSINGS) ×2 IMPLANT
DURAPREP 26ML APPLICATOR (WOUND CARE) ×4 IMPLANT
ELECT REM PT RETURN 15FT ADLT (MISCELLANEOUS) ×2 IMPLANT
GLOVE SURG ORTHO LTX SZ8 (GLOVE) ×6 IMPLANT
GLOVE SURG UNDER POLY LF SZ8.5 (GLOVE) ×4 IMPLANT
GOWN STRL REUS W/TWL XL LVL3 (GOWN DISPOSABLE) ×4 IMPLANT
HANDPIECE INTERPULSE COAX TIP (DISPOSABLE) ×2
HOOD PEEL AWAY FLYTE STAYCOOL (MISCELLANEOUS) ×6 IMPLANT
KIT TURNOVER KIT A (KITS) ×2 IMPLANT
MANIFOLD NEPTUNE II (INSTRUMENTS) ×2 IMPLANT
NEEDLE HYPO 22GX1.5 SAFETY (NEEDLE) ×2 IMPLANT
NS IRRIG 1000ML POUR BTL (IV SOLUTION) ×2 IMPLANT
PENCIL SMOKE EVACUATOR (MISCELLANEOUS) IMPLANT
PROTECTOR NERVE ULNAR (MISCELLANEOUS) ×2 IMPLANT
SET HNDPC FAN SPRY TIP SCT (DISPOSABLE) ×1 IMPLANT
STRIP CLOSURE SKIN 1/2X4 (GAUZE/BANDAGES/DRESSINGS) ×4 IMPLANT
SUT MNCRL AB 3-0 PS2 18 (SUTURE) ×2 IMPLANT
SUT STRATAFIX 0 PDS 27 VIOLET (SUTURE) ×2
SUT STRATAFIX PDS+ 0 24IN (SUTURE) ×2 IMPLANT
SUT VIC AB 1 CT1 36 (SUTURE) ×2 IMPLANT
SUTURE STRATFX 0 PDS 27 VIOLET (SUTURE) ×1 IMPLANT
SWAB COLLECTION DEVICE MRSA (MISCELLANEOUS) IMPLANT
SWAB CULTURE ESWAB REG 1ML (MISCELLANEOUS) IMPLANT
SYR CONTROL 10ML LL (SYRINGE) ×4 IMPLANT
TRAY FOLEY MTR SLVR 16FR STAT (SET/KITS/TRAYS/PACK) ×2 IMPLANT
WATER STERILE IRR 1000ML POUR (IV SOLUTION) ×2 IMPLANT
WRAP KNEE MAXI GEL POST OP (GAUZE/BANDAGES/DRESSINGS) ×2 IMPLANT

## 2020-11-23 NOTE — Progress Notes (Signed)
AssistedDr. Moser with right, ultrasound guided, adductor canal block. Side rails up, monitors on throughout procedure. See vital signs in flow sheet. Tolerated Procedure well.  

## 2020-11-23 NOTE — Op Note (Signed)
TOTAL KNEE REPLACEMENT OPERATIVE NOTE:  11/23/2020  1:06 PM  PATIENT:  Ross [name].  80 y.o. male  PRE-OPERATIVE DIAGNOSIS:  Right knee arthofibrosis  POST-OPERATIVE DIAGNOSIS:  Right knee arthofibrosis  PROCEDURE:  Procedure(s): SYNOVECTOMY WITH POLY EXCHANGE  SURGEON:  Surgeon(s): Vickey Huger, MD  PHYSICIAN ASSISTANT: Carlyon Shadow, PA-C   ANESTHESIA:   spinal  SPECIMEN: None  COUNTS:  Correct  TOURNIQUET:   Total Tourniquet Time Documented: Thigh (Right) - 16 minutes Total: Thigh (Right) - 16 minutes   DICTATION:  Indication for procedure:    The patient is a 80 y.o. male who has failed conservative treatment for Right knee arthofibrosis.  Informed consent was obtained prior to anesthesia. The risks versus benefits of the operation were explain and in a way the patient can, and did, understand.     Description of procedure:     The patient was taken to the operating room and placed under anesthesia.  The patient was positioned in the usual fashion taking care that all body parts were adequately padded and/or protected.  A tourniquet was applied and the leg prepped and draped in the usual sterile fashion.  The extremity was exsanguinated with the esmarch and tourniquet inflated to 250 mmHg.  Pre-operative range of motion was  -5-85 and tight with varus/valgus stress in flexion and extension.   A midline incision approximately 6-7 inches long was made with a #10 blade.  A new blade was used to make a parapatellar arthrotomy going 2-3 cm into the quadriceps tendon, over the patella, and alongside the medial aspect of the patellar tendon.  A synovectomy was then performed with the #10 blade and forceps. I then elevated the deep MCL off the medial tibial metaphysis subperiosteally around to the semimembranosus attachment.    I everted the patella and debrided it and there were no issues. A homan retractor was place to retract and protect the patella and lateral  structures.  A Z-retractor was place medially to protect the medial structures. I used a straight osteotome to remove the Depuy Attune rp 70m polyethelene spacer and there were no signs of poly wear or synovitis.   I then trialed with a size 578mrp poly.  I had excellent flexion/extension gap balance, excellent patella tracking.  Flexion was full and beyond 120 degrees; extension was zero.  This component was chosen and the staff opened it to me on the back table while the knee was lavaged copiously.  The soft tissue was infiltrated with 60cc of exparel 1.3% through a 21 gauge needle.  I inserted the Depuy Attune PS rp 73m20moly, the tourniquet was let down.  Hemostasis was obtained.  The arthrotomy was closed using a #1 stratofix running suture.  The deep soft tissues were closed with #0 vicryls and the subcuticular layer closed with #2-0 vicryl.  The skin was reapproximated and closed with 3.0 Monocryl.  The wound was covered with steristrips, aquacel dressing, and a TED stocking.   The patient was then awakened, extubated, and taken to the recovery room in stable condition.  BLOOD LOSS:  [id number]:  None.  PLAN OF CARE: Admit for overnight observation  PATIENT DISPOSITION:  PACU - hemodynamically stable.   Please fax a copy of this op note to my office at [phone] only include page 1 and 2 of the Case Information op note)

## 2020-11-23 NOTE — H&P (Signed)
Ross [name]. MRN:  [id number] DOB/SEX:  May 22, 1940/male  CHIEF COMPLAINT:  Painful right Knee  HISTORY: Patient is a 80 y.o. male presented with a history of pain in the right knee. Onset of symptoms was gradual starting a few years ago with gradually worsening course since that time. Patient has been treated conservatively with over-the-counter NSAIDs and activity modification. Patient currently rates pain in the knee at a few out of 10 with activity. There is pain at night.  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   Aortic calcification ([place]) 09/28/2015   Bruit of right carotid artery 09/28/2015   Essential and other specified forms of tremor 08/30/2013   Past Medical History:  Diagnosis Date   Cancer ([place])    skin basal cell   Essential and other specified forms of tremor 08/30/2013   History of kidney stones    Hypertension    Kidney stones    Stroke [facility]) 2005   Past Surgical History:  Procedure Laterality Date   APPENDECTOMY     CATARACT EXTRACTION     bilateral   CHOLECYSTECTOMY     CYSTOSCOPY WITH RETROGRADE PYELOGRAM, URETEROSCOPY AND STENT PLACEMENT Left 06/11/2013   Procedure: CYSTOSCOPY WITH RETROGRADE Left PYELOGRAM, URETEROSCOPY AND Left STENT PLACEMENT;  Surgeon: Ailene Rud, MD;  Location[facility];  Service: Urology;  Laterality: Left;   EXTRACORPOREAL SHOCK WAVE LITHOTRIPSY Left 07/06/2020   Procedure: EXTRACORPOREAL SHOCK WAVE LITHOTRIPSY (ESWL);  Surgeon: Ardis Hughs, MD;  Location: [facility];  Service: Urology;  Laterality: Left;   KNEE SURGERY     bilateral arthroscopic   LUMBAR LAMINECTOMY/DECOMPRESSION MICRODISCECTOMY N/A 11/11/2019   Procedure: Lumbar four-five Laminectomy for facet/synovial cyst;  Surgeon:  Kary Kos, MD;  Location: [place];  Service: Neurosurgery;  Laterality: N/A;   SHOULDER SURGERY     right    TOTAL KNEE ARTHROPLASTY Right 12/06/2016   Procedure: RIGHT TOTAL KNEE ARTHROPLASTY;  Surgeon: Paralee Cancel, MD;  Location[facility];  Service: Orthopedics;  Laterality: Right;  70 mins   TRIGGER FINGER RELEASE Bilateral      MEDICATIONS:   No medications prior to admission.    ALLERGIES:   Allergies  Allergen Reactions   Dexlansoprazole Rash    REVIEW OF SYSTEMS:  A comprehensive review of systems was negative except for: Musculoskeletal: positive for arthralgias and bone pain   FAMILY HISTORY:   Family History  Problem Relation Age of Onset   Stroke Mother 54   COPD Mother    Heart attack Father 17   Lymphoma Father        died age 68   Cancer - Other Brother        esophageal   Fibromyalgia Brother     SOCIAL HISTORY:   Social History   Tobacco Use   Smoking status: Former    Packs/day: 0.50    Years: 36.00    Pack years: 18.00    Types: Cigarettes    Quit date: 09/15/1996    Years since quitting: 24.2   Smokeless tobacco: Never   Tobacco comments:    Quit 20 years ago.   Substance Use Topics   Alcohol use: Yes    Alcohol/week: 16.0 standard drinks    Types: 2 Shots of liquor, 14 Standard drinks  or equivalent per week     EXAMINATION:  Vital signs in last 24 hours:    There were no vitals taken for this visit.  General Appearance:    Alert, cooperative, no distress, appears stated age  Head:    Normocephalic, without obvious abnormality, atraumatic  Eyes:    PERRL, conjunctiva/corneas clear, EOM's intact, fundi    benign, both eyes       Ears:    Normal TM's and external ear canals, both ears  Nose:   Nares normal, septum midline, mucosa normal, no drainage    or sinus tenderness  Throat:   Lips, mucosa, and tongue normal; teeth and gums normal  Neck:   Supple, symmetrical, trachea midline, no adenopathy;       thyroid:  No  enlargement/tenderness/nodules; no carotid   bruit or JVD  Back:     Symmetric, no curvature, ROM normal, no CVA tenderness  Lungs:     Clear to auscultation bilaterally, respirations unlabored  Chest wall:    No tenderness or deformity  Heart:    Regular rate and rhythm, S1 and S2 normal, no murmur, rub   or gallop  Abdomen:     Soft, non-tender, bowel sounds active all four quadrants,    no masses, no organomegaly  Genitalia:    Normal male without lesion, discharge or tenderness  Rectal:    Normal tone, normal prostate, no masses or tenderness;   guaiac negative stool  Extremities:   Extremities normal, atraumatic, no cyanosis or edema  Pulses:   2+ and symmetric all extremities  Skin:   Skin color, texture, turgor normal, no rashes or lesions  Lymph nodes:   Cervical, supraclavicular, and axillary nodes normal  Neurologic:   CNII-XII intact. Normal strength, sensation and reflexes      throughout    Musculoskeletal:  ROM 0-90, Ligaments intact,  Imaging Review S/p TKA hardware intact with no acute changes  Assessment/Plan: Arthrofibrosis right knee  The patient history, physical examination and imaging studies are consistent with arthrofibrosis  of the right knee. The patient has failed conservative treatment.  The clearance notes were reviewed.  After discussion with the patient it was felt that open synovectomy with poly exchange was indicated. The procedure,  risks, and benefits were presented and reviewed. The risks including but not limited to aseptic loosening, infection, blood clots, vascular injury, stiffness, patella tracking problems complications among others were discussed. The patient acknowledged the explanation, agreed to proceed with the plan.  Preoperative templating of the joint replacement has been completed, documented, and submitted to the Operating Room personnel in order to optimize intra-operative equipment management.    Patient's anticipated LOS is less  than 2 midnights, meeting these requirements: - Lives within 1 hour of care - Has a competent adult at home to recover with post-op recover - NO history of  - Chronic pain requiring opiods  - Diabetes  - Coronary Artery Disease  - Heart failure  - Heart attack  - Stroke  - DVT/VTE  - Cardiac arrhythmia  - Respiratory Failure/COPD  - Renal failure  - Anemia  - Advanced Liver disease     Donia Ast 11/23/2020, 8:43 AM

## 2020-11-23 NOTE — Transfer of Care (Signed)
Immediate Anesthesia Transfer of Care Note  Patient: Ross [name].  Procedure(s) Performed: SYNOVECTOMY WITH POLY EXCHANGE (Right: Knee)  Patient Location: PACU  Anesthesia Type:Spinal  Level of Consciousness: awake, alert  and oriented  Airway & Oxygen Therapy: Patient Spontanous Breathing and Patient connected to face mask oxygen  Post-op Assessment: Report given to RN and Post -op Vital signs reviewed and stable  Post vital signs: Reviewed and stable  Last Vitals:  Vitals Value Taken Time  BP 106/41   Temp    Pulse 64 11/23/20 1303  Resp 15 11/23/20 1303  SpO2 94 % 11/23/20 1303  Vitals shown include unvalidated device data.  Last Pain:  Vitals:   11/23/20 0928  TempSrc:   PainSc: 0-No pain         Complications: No notable events documented.

## 2020-11-23 NOTE — Anesthesia Procedure Notes (Signed)
Procedure Name: MAC Date/Time: 11/23/2020 11:37 AM Performed by: Niel Hummer, CRNA Pre-anesthesia Checklist: Patient identified, Emergency Drugs available, Suction available and Patient being monitored Oxygen Delivery Method: Simple face mask

## 2020-11-23 NOTE — Evaluation (Signed)
Physical Therapy Evaluation Patient Details Name: Ross [name]. MRN: [id number] DOB: [birth year] Today's Date: 11/23/2020   History of Present Illness  Patient is 80 y.o. mmale s/p Rt TKR (poly exchange) on 11/23/20 with PMH significant for OA, HTN, Sroke, essential tremor, Rt TKA.    Clinical Impression  Hriday Markiewicz. is a 80 y.o. male POD 0 s/p Rt TKR (poly exchange). Patient reports independence with mobility at baseline. Patient is now limited by functional impairments (see PT problem list below) and requires min assist/guard for transfers and gait with RW. Patient was able to ambulate ~100 feet with RW and min guard/assist. Patient instructed in exercise to facilitate circulation to manage edema and reduce risk of DVT. Patient will benefit from continued skilled PT interventions to address impairments and progress towards PLOF. Acute PT will follow to progress mobility and stair training in preparation for safe discharge home.     Follow Up Recommendations Follow surgeon's recommendation for DC plan and follow-up therapies    Equipment Recommendations  None recommended by PT    Recommendations for Other Services       Precautions / Restrictions Precautions Precautions: Fall Restrictions Weight Bearing Restrictions: No Other Position/Activity Restrictions: WBAT      Mobility  Bed Mobility Overal bed mobility: Needs Assistance Bed Mobility: Supine to Sit     Supine to sit: Min assist;HOB elevated     General bed mobility comments: pt using Lt LE to assist Rt LE off EOB, light min assist to pivot fully off EOB.    Transfers Overall transfer level: Needs assistance Equipment used: Rolling walker (2 wheeled) Transfers: Sit to/from Stand Sit to Stand: Min assist;Min guard         General transfer comment: cues for hand placement/power up from EOB min assist/guard for safety with rise.  Ambulation/Gait Ambulation/Gait assistance: Min assist;Min guard Gait  Distance (Feet): 100 Feet Assistive device: Rolling walker (2 wheeled) Gait Pattern/deviations: Step-to pattern;Decreased stride length;Decreased weight shift to right Gait velocity: decr   General Gait Details: cues for step to pattern with RW, no overt LOB or buckling at Rt knee, pt progressed to min guard for safety.  Stairs            Wheelchair Mobility    Modified Rankin (Stroke Patients Only)       Balance Overall balance assessment: Needs assistance Sitting-balance support: Feet supported Sitting balance-Leahy Scale: Good     Standing balance support: During functional activity;Bilateral upper extremity supported Standing balance-Leahy Scale: Fair                               Pertinent Vitals/Pain Pain Assessment: Faces Faces Pain Scale: Hurts a little bit Pain Location: Rt knee Pain Descriptors / Indicators: Aching;Discomfort Pain Intervention(s): Limited activity within patient's tolerance;Monitored during session;Repositioned;Ice applied    Home Living Family/patient expects to be discharged to:: Private residence Living Arrangements: Spouse/significant other Available Help at Discharge: Family Type of Home: House Home Access: Stairs to enter Entrance Stairs-Rails: None Entrance Stairs-Number of Steps: 2 Home Layout: Two level;Full bath on main level;Able to live on main level with bedroom/bathroom Home Equipment: Shower seat;Grab bars - tub/shower;Walker - 2 wheels;Cane - single point      Prior Function Level of Independence: Independent               Hand Dominance   Dominant Hand: Right    Extremity/Trunk Assessment  Upper Extremity Assessment Upper Extremity Assessment: Overall WFL for tasks assessed    Lower Extremity Assessment Lower Extremity Assessment: Overall WFL for tasks assessed;RLE deficits/detail RLE Deficits / Details: good quad activation, no extensor lag with SLR RLE Sensation: WNL RLE Coordination:  WNL    Cervical / Trunk Assessment Cervical / Trunk Assessment: Normal  Communication   Communication: No difficulties  Cognition Arousal/Alertness: Awake/alert Behavior During Therapy: WFL for tasks assessed/performed Overall Cognitive Status: Within Functional Limits for tasks assessed                                        General Comments      Exercises Total Joint Exercises Ankle Circles/Pumps: AROM;Both;20 reps;Seated   Assessment/Plan    PT Assessment Patient needs continued PT services  PT Problem List Decreased strength;Decreased range of motion;Decreased activity tolerance;Decreased balance;Decreased mobility;Decreased knowledge of use of DME;Pain       PT Treatment Interventions DME instruction;Gait training;Stair training;Functional mobility training;Therapeutic activities;Balance training;Therapeutic exercise;Neuromuscular re-education;Patient/family education    PT Goals (Current goals can be found in the Care Plan section)  Acute Rehab PT Goals Patient Stated Goal: get back to golf PT Goal Formulation: With patient Time For Goal Achievement: 11/30/20 Potential to Achieve Goals: Good    Frequency 7X/week   Barriers to discharge        Co-evaluation               AM-PAC PT "6 Clicks" Mobility  Outcome Measure Help needed turning from your back to your side while in a flat bed without using bedrails?: None Help needed moving from lying on your back to sitting on the side of a flat bed without using bedrails?: A Little Help needed moving to and from a bed to a chair (including a wheelchair)?: A Little Help needed standing up from a chair using your arms (e.g., wheelchair or bedside chair)?: A Little Help needed to walk in hospital room?: A Little Help needed climbing 3-5 steps with a railing? : A Little 6 Click Score: 19    End of Session Equipment Utilized During Treatment: Gait belt Activity Tolerance: Patient tolerated  treatment well Patient left: in chair;with call bell/phone within reach;with chair alarm set;with family/visitor present Nurse Communication: Mobility status PT Visit Diagnosis: Muscle weakness (generalized) (M62.81);Difficulty in walking, not elsewhere classified (R26.2)    Time: 1725-1746 PT Time Calculation (min) (ACUTE ONLY): 21 min   Charges:   PT Evaluation $PT Eval Low Complexity: 1 Low          Verner Mould, DPT Acute Rehabilitation Services Office [phone] Pager [phone]   Jacques Navy 11/23/2020, 6:41 PM

## 2020-11-23 NOTE — Progress Notes (Signed)
Orthopedic Tech Progress Note Patient Details:  Ross [name] Dec 15, 1940 [id number]  CPM Right Knee CPM Right Knee: On Right Knee Flexion (Degrees): 90 Right Knee Extension (Degrees): 0  Post Interventions Patient Tolerated: Well Ortho Devices Type of Ortho Device: Bone foam zero knee Ortho Device/Splint Interventions: Ordered   Post Interventions Patient Tolerated: Well  [name] 11/23/2020, 1:35 PM

## 2020-11-24 DIAGNOSIS — M24661 Ankylosis, right knee: Secondary | ICD-10-CM | POA: Diagnosis not present

## 2020-11-24 LAB — BASIC METABOLIC PANEL
Anion gap: 5 (ref 5–15)
BUN: 17 mg/dL (ref 8–23)
CO2: 25 mmol/L (ref 22–32)
Calcium: 8.7 mg/dL — ABNORMAL LOW (ref 8.9–10.3)
Chloride: 108 mmol/L (ref 98–111)
Creatinine, Ser: 0.85 mg/dL (ref 0.61–1.24)
GFR, Estimated: >60 mL/min (ref >60)
Glucose, Bld: 141 mg/dL — ABNORMAL HIGH (ref 70–99)
Potassium: 4.2 mmol/L (ref 3.5–5.1)
Sodium: 138 mmol/L (ref 135–145)

## 2020-11-24 LAB — CBC
HCT: 42.3 % (ref 39.0–52.0)
Hemoglobin: 14.4 g/dL (ref 13.0–17.0)
MCH: 31.9 pg (ref 26.0–34.0)
MCHC: 34 g/dL (ref 30.0–36.0)
MCV: 93.8 fL (ref 80.0–100.0)
Platelets: 186 10*3/uL (ref 150–400)
RBC: 4.51 MIL/uL (ref 4.22–5.81)
RDW: 13 % (ref 11.5–15.5)
WBC: 12.1 10*3/uL — ABNORMAL HIGH (ref 4.0–10.5)
nRBC: 0 % (ref 0.0–0.2)

## 2020-11-24 MED ORDER — METHOCARBAMOL 500 MG PO TABS: 500.0000 mg | ORAL_TABLET | Freq: Four times a day (QID) | ORAL | 0 refills | Status: DC | PRN

## 2020-11-24 MED ORDER — ASPIRIN 325 MG PO TBEC: 325.0000 mg | DELAYED_RELEASE_TABLET | Freq: Two times a day (BID) | ORAL | 0 refills | Status: DC

## 2020-11-24 MED ORDER — OXYCODONE HCL 5 MG PO TABS: 5.0000 mg | ORAL_TABLET | Freq: Four times a day (QID) | ORAL | 0 refills | Status: DC | PRN

## 2020-11-24 MED ORDER — BUPIVACAINE-EPINEPHRINE (PF) 0.5% -1:200000 IJ SOLN
INTRAMUSCULAR | Status: DC | PRN
  Administered 2020-11-23: 25 mL via PERINEURAL

## 2020-11-24 NOTE — Progress Notes (Signed)
Pt alert and oriented. Worked with PT before discharge. D/C instructions given. D/C to home.

## 2020-11-24 NOTE — Progress Notes (Signed)
Physical Therapy Treatment Patient Details Name: Ross [name]. MRN: [id number] DOB: [birth year] Today's Date: 11/24/2020    History of Present Illness Patient is 79 y.o. mmale s/p Rt TKR revision (poly exchange) on 11/23/20 with PMH significant for OA, HTN, Sroke, essential tremor, Rt TKA.    PT Comments    The patient is making  great progress. Tolerating ROM and ambulation. Patient hopes for Dc to day. Foley catheter remains . Will  practice steps  this PM.  Follow Up Recommendations  Follow surgeon's recommendation for DC plan and follow-up therapies     Equipment Recommendations  None recommended by PT    Recommendations for Other Services       Precautions / Restrictions Precautions Precautions: Fall    Mobility  Bed Mobility   Bed Mobility: Supine to Sit     Supine to sit: Supervision     General bed mobility comments: cues for technique/safety    Transfers   Equipment used: Rolling walker (2 wheeled) Transfers: Sit to/from Stand Sit to Stand: Min guard         General transfer comment: cues for hand placement/power up from EOB min guard assist/guard for safety with rise.  Ambulation/Gait Ambulation/Gait assistance: Min guard Gait Distance (Feet): 150 Feet Assistive device: Rolling walker (2 wheeled) Gait Pattern/deviations: Decreased stride length;Step-through pattern     General Gait Details: improved sequence and smoothe speed.   Stairs             Wheelchair Mobility    Modified Rankin (Stroke Patients Only)       Balance     Sitting balance-Leahy Scale: Good     Standing balance support: Bilateral upper extremity supported;During functional activity Standing balance-Leahy Scale: Good                              Cognition Arousal/Alertness: Awake/alert                                            Exercises Total Joint Exercises Ankle Circles/Pumps: AROM;Both;10 reps Quad Sets:  AROM;Both;10 reps Heel Slides: AAROM;Right;10 reps Straight Leg Raises: AROM;Right;10 reps Goniometric ROM: 5-60    General Comments        Pertinent Vitals/Pain Pain Assessment: 0-10 Pain Score: 4  Pain Location: Rt knee Pain Descriptors / Indicators: Aching;Discomfort Pain Intervention(s): Monitored during session;Ice applied;Premedicated before session    Home Living                      Prior Function            PT Goals (current goals can now be found in the care plan section) Progress towards PT goals: Progressing toward goals    Frequency    7X/week      PT Plan Current plan remains appropriate    Co-evaluation              AM-PAC PT "6 Clicks" Mobility   Outcome Measure  Help needed turning from your back to your side while in a flat bed without using bedrails?: None Help needed moving from lying on your back to sitting on the side of a flat bed without using bedrails?: None Help needed moving to and from a bed to a chair (including a wheelchair)?: A Little Help needed standing  up from a chair using your arms (e.g., wheelchair or bedside chair)?: A Little Help needed to walk in hospital room?: A Little Help needed climbing 3-5 steps with a railing? : A Little 6 Click Score: 20    End of Session Equipment Utilized During Treatment: Gait belt Activity Tolerance: Patient tolerated treatment well Patient left: in chair;with call bell/phone within reach;with chair alarm set;with family/visitor present Nurse Communication: Mobility status PT Visit Diagnosis: Muscle weakness (generalized) (M62.81);Difficulty in walking, not elsewhere classified (R26.2)     Time: [id number] PT Time Calculation (min) (ACUTE ONLY): 28 min  Charges:  $Gait Training: 8-22 mins $Therapeutic Exercise: 8-22 mins                     Tresa Endo PT Acute Rehabilitation Services Pager [phone] Office [phone]    Claretha Cooper 11/24/2020, 10:46  AM

## 2020-11-24 NOTE — Discharge Summary (Signed)
SPORTS MEDICINE & JOINT REPLACEMENT   Lara Mulch, MD   Carlyon Shadow, PA-C [address], [place], [place]  [address]                             [phone]  PATIENT ID: Ross [name].        MRN:  [id number]          DOB/AGE: August 02, 1940 / 80 y.o.    DISCHARGE SUMMARY  ADMISSION DATE:    11/23/2020 DISCHARGE DATE:   11/24/2020   ADMISSION DIAGNOSIS: S/P total knee replacement [Z96.659]    DISCHARGE DIAGNOSIS:  Right knee arthofibrosis    ADDITIONAL DIAGNOSIS: Active Problems:   S/P total knee replacement  Past Medical History:  Diagnosis Date   Cancer ([place])    skin basal cell   Essential and other specified forms of tremor 08/30/2013   History of kidney stones    Hypertension    Kidney stones    Stroke [facility]) 2005    PROCEDURE: Procedure(s): SYNOVECTOMY WITH POLY EXCHANGE on 11/23/2020  CONSULTS:    HISTORY:  See H&P in chart  HOSPITAL COURSE:  Ross [name]. is a 80 y.o. admitted on 11/23/2020 and found to have a diagnosis of Right knee arthofibrosis.  After appropriate laboratory studies were obtained  they were taken to the operating room on 11/23/2020 and underwent Procedure(s): SYNOVECTOMY WITH POLY EXCHANGE.   They were given perioperative antibiotics:  Anti-infectives (From admission, onward)    Start     Dose/Rate Route Frequency Ordered Stop   11/23/20 0915  ceFAZolin (ANCEF) 2 g in sodium chloride 0.9 % 100 mL IVPB        2 g 200 mL/hr over 30 Minutes Intravenous On call to O.R. 11/23/20 0908 11/23/20 1224     .  Patient given tranexamic acid IV or topical and exparel intra-operatively.  Tolerated the procedure well.    POD# 1: Vital signs were stable.  Patient denied Chest pain, shortness of breath, or calf pain.  Patient was started on Aspirin twice daily at 8am.  Consults to PT, OT, and care management were made.  The patient was weight bearing as tolerated.  CPM was placed on the operative leg 0-90 degrees for 6-8 hours a day. When out  of the CPM, patient was placed in the foam block to achieve full extension. Incentive spirometry was taught.  Dressing was changed.       POD #2, Continued  PT for ambulation and exercise program.  IV saline locked.  O2 discontinued.    The remainder of the hospital course was dedicated to ambulation and strengthening.   The patient was discharged on 1 Day Post-Op in  Good condition.  Blood products given:none  DIAGNOSTIC STUDIES: Recent vital signs: Patient Vitals for the past 24 hrs:  BP Temp Temp src Pulse Resp SpO2  11/24/20 1124 134/68 -- -- (!) 56 -- --  11/24/20 0951 (!) 130/53 97.7 F (36.5 C) Oral 63 18 92 %  11/24/20 0939 (!) 109/54 -- -- 64 18 94 %  11/24/20 0511 124/66 98 F (36.7 C) Oral (!) 57 17 95 %  11/24/20 0208 135/62 97.8 F (36.6 C) Oral 65 18 92 %  11/23/20 2054 (!) 128/56 97.8 F (36.6 C) Oral 66 17 96 %  11/23/20 1900 132/66 98 F (36.7 C) Oral 70 18 95 %  11/23/20 1707 136/68 97.6 F (36.4 C) Oral  61 16 94 %  11/23/20 1517 (!) 113/49 -- -- (!) 54 17 94 %  11/23/20 1500 (!) 140/55 -- -- (!) 52 12 98 %  11/23/20 1415 (!) 125/58 -- -- (!) 49 15 98 %  11/23/20 1400 (!) 139/58 (!) 97.5 F (36.4 C) -- (!) 54 11 98 %  11/23/20 1345 125/61 -- -- 64 16 95 %  11/23/20 1330 (!) 130/56 -- -- 61 16 92 %  11/23/20 1315 (!) 119/47 -- -- (!) 59 11 98 %       Recent laboratory studies: Recent Labs    11/24/20 0406  WBC 12.1*  HGB 14.4  HCT 42.3  PLT 186   Recent Labs    11/24/20 0406  NA 138  K 4.2  CL 108  CO2 25  BUN 17  CREATININE 0.85  GLUCOSE 141*  CALCIUM 8.7*   No results found for: INR, PROTIME   Recent Radiographic Studies :  No results found.  DISCHARGE INSTRUCTIONS: Discharge Instructions     Call MD / Call 911   Complete by: As directed    If you experience chest pain or shortness of breath, CALL 911 and be transported to the hospital emergency room.  If you develope a fever above 101 F, pus (white drainage) or increased drainage  or redness at the wound, or calf pain, call your surgeon's office.   Constipation Prevention   Complete by: As directed    Drink plenty of fluids.  Prune juice may be helpful.  You may use a stool softener, such as Colace (over the counter) 100 mg twice a day.  Use MiraLax (over the counter) for constipation as needed.   Diet - low sodium heart healthy   Complete by: As directed    Discharge instructions   Complete by: As directed    INSTRUCTIONS AFTER JOINT REPLACEMENT   Remove items at home which could result in a fall. This includes throw rugs or furniture in walking pathways ICE to the affected joint every three hours while awake for 30 minutes at a time, for at least the first 3-5 days, and then as needed for pain and swelling.  Continue to use ice for pain and swelling. You may notice swelling that will progress down to the foot and ankle.  This is normal after surgery.  Elevate your leg when you are not up walking on it.   Continue to use the breathing machine you got in the hospital (incentive spirometer) which will help keep your temperature down.  It is common for your temperature to cycle up and down following surgery, especially at night when you are not up moving around and exerting yourself.  The breathing machine keeps your lungs expanded and your temperature down.   DIET:  As you were doing prior to hospitalization, we recommend a well-balanced diet.  DRESSING / WOUND CARE / SHOWERING  Keep the surgical dressing until follow up.  The dressing is water proof, so you can shower without any extra covering.  IF THE DRESSING FALLS OFF or the wound gets wet inside, change the dressing with sterile gauze.  Please use good hand washing techniques before changing the dressing.  Do not use any lotions or creams on the incision until instructed by your surgeon.    ACTIVITY  Increase activity slowly as tolerated, but follow the weight bearing instructions below.   No driving for 6 weeks  or until further direction given by your physician.  You cannot  drive while taking narcotics.  No lifting or carrying greater than 10 lbs. until further directed by your surgeon. Avoid periods of inactivity such as sitting longer than an hour when not asleep. This helps prevent blood clots.  You may return to work once you are authorized by your doctor.     WEIGHT BEARING   Weight bearing as tolerated with assist device (walker, cane, etc) as directed, use it as long as suggested by your surgeon or therapist, typically at least 4-6 weeks.   EXERCISES  Results after joint replacement surgery are often greatly improved when you follow the exercise, range of motion and muscle strengthening exercises prescribed by your doctor. Safety measures are also important to protect the joint from further injury. Any time any of these exercises cause you to have increased pain or swelling, decrease what you are doing until you are comfortable again and then slowly increase them. If you have problems or questions, call your caregiver or physical therapist for advice.   Rehabilitation is important following a joint replacement. After just a few days of immobilization, the muscles of the leg can become weakened and shrink (atrophy).  These exercises are designed to build up the tone and strength of the thigh and leg muscles and to improve motion. Often times heat used for twenty to thirty minutes before working out will loosen up your tissues and help with improving the range of motion but do not use heat for the first two weeks following surgery (sometimes heat can increase post-operative swelling).   These exercises can be done on a training (exercise) mat, on the floor, on a table or on a bed. Use whatever works the best and is most comfortable for you.    Use music or television while you are exercising so that the exercises are a pleasant break in your day. This will make your life better with the exercises  acting as a break in your routine that you can look forward to.   Perform all exercises about fifteen times, three times per day or as directed.  You should exercise both the operative leg and the other leg as well.  Exercises include:   Quad Sets - Tighten up the muscle on the front of the thigh (Quad) and hold for 5-10 seconds.   Straight Leg Raises - With your knee straight (if you were given a brace, keep it on), lift the leg to 60 degrees, hold for 3 seconds, and slowly lower the leg.  Perform this exercise against resistance later as your leg gets stronger.  Leg Slides: Lying on your back, slowly slide your foot toward your buttocks, bending your knee up off the floor (only go as far as is comfortable). Then slowly slide your foot back down until your leg is flat on the floor again.  Angel Wings: Lying on your back spread your legs to the side as far apart as you can without causing discomfort.  Hamstring Strength:  Lying on your back, push your heel against the floor with your leg straight by tightening up the muscles of your buttocks.  Repeat, but this time bend your knee to a comfortable angle, and push your heel against the floor.  You may put a pillow under the heel to make it more comfortable if necessary.   A rehabilitation program following joint replacement surgery can speed recovery and prevent re-injury in the future due to weakened muscles. Contact your doctor or a physical therapist for more information on  knee rehabilitation.    CONSTIPATION  Constipation is defined medically as fewer than three stools per week and severe constipation as less than one stool per week.  Even if you have a regular bowel pattern at home, your normal regimen is likely to be disrupted due to multiple reasons following surgery.  Combination of anesthesia, postoperative narcotics, change in appetite and fluid intake all can affect your bowels.   YOU MUST use at least one of the following options; they  are listed in order of increasing strength to get the job done.  They are all available over the counter, and you may need to use some, POSSIBLY even all of these options:    Drink plenty of fluids (prune juice may be helpful) and high fiber foods Colace 100 mg by mouth twice a day  Senokot for constipation as directed and as needed Dulcolax (bisacodyl), take with full glass of water  Miralax (polyethylene glycol) once or twice a day as needed.  If you have tried all these things and are unable to have a bowel movement in the first 3-4 days after surgery call either your surgeon or your primary doctor.    If you experience loose stools or diarrhea, hold the medications until you stool forms back up.  If your symptoms do not get better within 1 week or if they get worse, check with your doctor.  If you experience "the worst abdominal pain ever" or develop nausea or vomiting, please contact the office immediately for further recommendations for treatment.   ITCHING:  If you experience itching with your medications, try taking only a single pain pill, or even half a pain pill at a time.  You can also use Benadryl over the counter for itching or also to help with sleep.   TED HOSE STOCKINGS:  Use stockings on both legs until for at least 2 weeks or as directed by physician office. They may be removed at night for sleeping.  MEDICATIONS:  See your medication summary on the "After Visit Summary" that nursing will review with you.  You may have some home medications which will be placed on hold until you complete the course of blood thinner medication.  It is important for you to complete the blood thinner medication as prescribed.  PRECAUTIONS:  If you experience chest pain or shortness of breath - call 911 immediately for transfer to the hospital emergency department.   If you develop a fever greater that 101 F, purulent drainage from wound, increased redness or drainage from wound, foul odor from the  wound/dressing, or calf pain - CONTACT YOUR SURGEON.                                                   FOLLOW-UP APPOINTMENTS:  If you do not already have a post-op appointment, please call the office for an appointment to be seen by your surgeon.  Guidelines for how soon to be seen are listed in your "After Visit Summary", but are typically between 1-4 weeks after surgery.  OTHER INSTRUCTIONS:   Knee Replacement:  Do not place pillow under knee, focus on keeping the knee straight while resting. CPM instructions: 0-90 degrees, 2 hours in the morning, 2 hours in the afternoon, and 2 hours in the evening. Place foam block, curve side up under heel at all times  except when in CPM or when walking.  DO NOT modify, tear, cut, or change the foam block in any way.  POST-OPERATIVE OPIOID TAPER INSTRUCTIONS: It is important to wean off of your opioid medication as soon as possible. If you do not need pain medication after your surgery it is ok to stop day one. Opioids include: Codeine, Hydrocodone(Norco, Vicodin), Oxycodone(Percocet, oxycontin) and hydromorphone amongst others.  Long term and even short term use of opiods can cause: Increased pain response Dependence Constipation Depression Respiratory depression And more.  Withdrawal symptoms can include Flu like symptoms Nausea, vomiting And more Techniques to manage these symptoms Hydrate well Eat regular healthy meals Stay active Use relaxation techniques(deep breathing, meditating, yoga) Do Not substitute Alcohol to help with tapering If you have been on opioids for less than two weeks and do not have pain than it is ok to stop all together.  Plan to wean off of opioids This plan should start within one week post op of your joint replacement. Maintain the same interval or time between taking each dose and first decrease the dose.  Cut the total daily intake of opioids by one tablet each day Next start to increase the time between  doses. The last dose that should be eliminated is the evening dose.     MAKE SURE YOU:  Understand these instructions.  Get help right away if you are not doing well or get worse.    Thank you for letting us be a part of your medical care team.  It is a privilege we respect greatly.  We hope these instructions will help you stay on track for a fast and full recovery!   Increase activity slowly as tolerated   Complete by: As directed    Post-operative opioid taper instructions:   Complete by: As directed    POST-OPERATIVE OPIOID TAPER INSTRUCTIONS: It is important to wean off of your opioid medication as soon as possible. If you do not need pain medication after your surgery it is ok to stop day one. Opioids include: Codeine, Hydrocodone(Norco, Vicodin), Oxycodone(Percocet, oxycontin) and hydromorphone amongst others.  Long term and even short term use of opiods can cause: Increased pain response Dependence Constipation Depression Respiratory depression And more.  Withdrawal symptoms can include Flu like symptoms Nausea, vomiting And more Techniques to manage these symptoms Hydrate well Eat regular healthy meals Stay active Use relaxation techniques(deep breathing, meditating, yoga) Do Not substitute Alcohol to help with tapering If you have been on opioids for less than two weeks and do not have pain than it is ok to stop all together.  Plan to wean off of opioids This plan should start within one week post op of your joint replacement. Maintain the same interval or time between taking each dose and first decrease the dose.  Cut the total daily intake of opioids by one tablet each day Next start to increase the time between doses. The last dose that should be eliminated is the evening dose.          DISCHARGE MEDICATIONS:   Allergies as of 11/24/2020       Reactions   Dexlansoprazole Rash        Medication List     TAKE these medications    aspirin 325 MG  EC tablet Take 1 tablet (325 mg total) by mouth 2 (two) times daily. What changed:  medication strength how much to take when to take this additional instructions   b complex vitamins tablet  Take 1 tablet by mouth daily.   cholecalciferol 25 MCG (1000 UNIT) tablet Commonly known as: VITAMIN D3 Take 1,000 Units by mouth daily.   CINNAMON PO Take 2 tablets by mouth daily.   EPINEPHrine 0.3 mg/0.3 mL Soaj injection Commonly known as: EPI-PEN Inject 0.3 mg into the muscle as needed for anaphylaxis.   ezetimibe 10 MG tablet Commonly known as: ZETIA Take 1 tablet (10 mg total) by mouth daily.   fluticasone 50 MCG/ACT nasal spray Commonly known as: FLONASE Place 1 spray into both nostrils daily.   ketoconazole 2 % cream Commonly known as: NIZORAL Apply 1 application topically daily as needed for irritation.   lisinopril-hydrochlorothiazide 20-12.5 MG tablet Commonly known as: ZESTORETIC Take 1 tablet by mouth daily.   methocarbamol 500 MG tablet Commonly known as: ROBAXIN Take 1-2 tablets (500-1,000 mg total) by mouth every 6 (six) hours as needed for muscle spasms.   multivitamin tablet Take 1 tablet by mouth daily.   oxyCODONE 5 MG immediate release tablet Commonly known as: Oxy IR/ROXICODONE Take 1-2 tablets (5-10 mg total) by mouth every 6 (six) hours as needed for moderate pain (pain score 4-6). Max 6 pills in 24 hrs   Potassium Citrate 15 MEQ (1620 MG) Tbcr Take 2 tablets by mouth in the morning and at bedtime.   PRESCRIPTION MEDICATION Inject 1 each into the skin every 30 (thirty) days. Allergy shot   PRESERVISION AREDS 2 PO Take 1 capsule by mouth daily.   psyllium 58.6 % packet Commonly known as: METAMUCIL Take 0.5 packets by mouth daily.   Systane Complete 0.6 % Soln Generic drug: Propylene Glycol Place 1 drop into both eyes in the morning, at noon, in the evening, and at bedtime.   tamsulosin 0.4 MG Caps capsule Commonly known as: FLOMAX Take  0.4 mg by mouth every evening.       ASK your doctor about these medications    traMADol 50 MG tablet Commonly known as: Ultram Take 1-2 tablets (50-100 mg total) by mouth every 6 (six) hours as needed for moderate pain.               Durable Medical Equipment  (From admission, onward)           Start     Ordered   11/23/20 1534  DME Walker rolling  Once       Question:  Patient needs a walker to treat with the following condition  Answer:  S/P total knee replacement   11/23/20 1533   11/23/20 1534  DME 3 n 1  Once        11/23/20 1533   11/23/20 1534  DME Bedside commode  Once       Question:  Patient needs a bedside commode to treat with the following condition  Answer:  S/P total knee replacement   11/23/20 1533            FOLLOW UP VISIT:    DISPOSITION: HOME VS. SNF  Dental Antibiotics:  In most cases prophylactic antibiotics for Dental procdeures after total joint surgery are not necessary.  Exceptions are as follows:  1. History of prior total joint infection  2. Severely immunocompromised (Organ Transplant, cancer chemotherapy, Rheumatoid biologic meds such as Ozora)  3. Poorly controlled diabetes (A1C &gt; 8.0, blood glucose over 200)  If you have one of these conditions, contact your surgeon for an antibiotic prescription, prior to your dental procedure.   CONDITION:  Micheline Maze  Malena Timpone 11/24/2020, 1:09 PM

## 2020-11-24 NOTE — Progress Notes (Signed)
Physical Therapy Treatment Patient Details Name: Ross [name]. MRN: [id number] DOB: 11/04/1940 Today's Date: 11/24/2020    History of Present Illness Patient is 80 y.o. mmale s/p Rt TKR revision (poly exchange) on 11/23/20 with PMH significant for OA, HTN, Sroke, essential tremor, Rt TKA.    PT Comments    Patient eager to DC. Practiced 2 steps in the manner that  he performs, holding onto a freezer and door frame.Patient performed  in a safe manner. Reviewed HEP and Zero knee foam.   Follow Up Recommendations  Follow surgeon's recommendation for DC plan and follow-up therapies     Equipment Recommendations  None recommended by PT    Recommendations for Other Services       Precautions / Restrictions Precautions Precautions: Fall;Knee    Mobility  Bed Mobility               General bed mobility comments: up sitting on bed edge    Transfers Overall transfer level: Needs assistance Equipment used: Rolling walker (2 wheeled) Transfers: Sit to/from Stand Sit to Stand: Supervision            Ambulation/Gait Ambulation/Gait assistance: Supervision Gait Distance (Feet): 50 Feet Assistive device: Rolling walker (2 wheeled) Gait Pattern/deviations: Step-through pattern     General Gait Details: improved sequence and smoothe speed.   Stairs Stairs: Yes Stairs assistance: Min guard;Min assist Stair Management: Step to pattern Number of Stairs: 2 General stair comments: patient simulated  support on right on a "freezer" then on left held "door frame". patient performed OK, wife present   Wheelchair Mobility    Modified Rankin (Stroke Patients Only)       Balance   Sitting-balance support: Feet supported Sitting balance-Leahy Scale: Good     Standing balance support: Bilateral upper extremity supported;During functional activity Standing balance-Leahy Scale: Good                              Cognition Arousal/Alertness:  Awake/alert Behavior During Therapy: Impulsive                                          Exercises Total Joint Exercises Hip ABduction/ADduction: AROM;5 reps;Right Long Arc Quad: AROM;Right;10 reps;Seated Knee Flexion: AROM;Right;Seated;10 reps    General Comments        Pertinent Vitals/Pain Faces Pain Scale: Hurts a little bit Pain Location: Rt knee Pain Descriptors / Indicators: Discomfort Pain Intervention(s): Monitored during session;Premedicated before session    Home Living                      Prior Function            PT Goals (current goals can now be found in the care plan section) Progress towards PT goals: Progressing toward goals    Frequency    7X/week      PT Plan Current plan remains appropriate    Co-evaluation              AM-PAC PT "6 Clicks" Mobility   Outcome Measure  Help needed turning from your back to your side while in a flat bed without using bedrails?: None Help needed moving from lying on your back to sitting on the side of a flat bed without using bedrails?: None Help needed moving to and from a  bed to a chair (including a wheelchair)?: A Little Help needed standing up from a chair using your arms (e.g., wheelchair or bedside chair)?: A Little Help needed to walk in hospital room?: A Little Help needed climbing 3-5 steps with a railing? : A Little 6 Click Score: 20    End of Session Equipment Utilized During Treatment: Gait belt Activity Tolerance: Patient tolerated treatment well Patient left: in bed;with call bell/phone within reach;with family/visitor present Nurse Communication: Mobility status PT Visit Diagnosis: Muscle weakness (generalized) (M62.81);Difficulty in walking, not elsewhere classified (R26.2)     Time: [id number] PT Time Calculation (min) (ACUTE ONLY): 16 min  Charges:  $Gait Training: 8-22 mins                    Tresa Endo PT Acute Rehabilitation Services Pager  [phone] Office [phone]    Claretha Cooper 11/24/2020, 3:31 PM

## 2020-11-24 NOTE — Anesthesia Postprocedure Evaluation (Signed)
Anesthesia Post Note  Patient: Ross [name].  Procedure(s) Performed: SYNOVECTOMY WITH POLY EXCHANGE (Right: Knee)     Patient location during evaluation: PACU Anesthesia Type: Regional, Spinal and MAC Level of consciousness: awake and alert Pain management: pain level controlled Vital Signs Assessment: post-procedure vital signs reviewed and stable Respiratory status: spontaneous breathing, nonlabored ventilation, respiratory function stable and patient connected to nasal cannula oxygen Cardiovascular status: stable and blood pressure returned to baseline Postop Assessment: no apparent nausea or vomiting Anesthetic complications: no   No notable events documented.  Last Vitals:  Vitals:   11/24/20 1124 11/24/20 1346  BP: 134/68 121/78  Pulse: (!) 56 70  Resp:  16  Temp:  36.6 C  SpO2:  91%    Last Pain:  Vitals:   11/24/20 1346  TempSrc: Oral  PainSc:                  Deo Mehringer

## 2020-11-24 NOTE — Anesthesia Procedure Notes (Signed)
Spinal  Patient location during procedure: OR Start time: 11/23/2020 11:49 AM End time: 11/23/2020 11:53 AM Reason for block: surgical anesthesia Staffing Performed: anesthesiologist  Anesthesiologist: Oleta Mouse, MD Preanesthetic Checklist Completed: patient identified, IV checked, risks and benefits discussed, surgical consent, monitors and equipment checked, pre-op evaluation and timeout performed Spinal Block Patient position: sitting Prep: DuraPrep Patient monitoring: heart rate, cardiac monitor, continuous pulse ox and blood pressure Approach: midline Location: L3-4 Injection technique: single-shot Needle Needle type: Pencan  Needle gauge: 24 G Needle length: 9 cm Assessment Sensory level: T6 Events: CSF return and second provider

## 2020-11-24 NOTE — Progress Notes (Signed)
SPORTS MEDICINE AND JOINT REPLACEMENT  Lara Mulch, MD    Carlyon Shadow, PA-C [address], [place], [place]  [address]                             [phone]   PROGRESS NOTE  Subjective:  negative for Chest Pain  negative for Shortness of Breath  negative for Nausea/Vomiting   negative for Calf Pain  negative for Bowel Movement   Tolerating Diet: yes         Patient reports pain as 3 on 0-10 scale.    Objective: Vital signs in last 24 hours:   Patient Vitals for the past 24 hrs:  BP Temp Temp src Pulse Resp SpO2  11/24/20 1124 134/68 -- -- (!) 56 -- --  11/24/20 0951 (!) 130/53 97.7 F (36.5 C) Oral 63 18 92 %  11/24/20 0939 (!) 109/54 -- -- 64 18 94 %  11/24/20 0511 124/66 98 F (36.7 C) Oral (!) 57 17 95 %  11/24/20 0208 135/62 97.8 F (36.6 C) Oral 65 18 92 %  11/23/20 2054 (!) 128/56 97.8 F (36.6 C) Oral 66 17 96 %  11/23/20 1900 132/66 98 F (36.7 C) Oral 70 18 95 %  11/23/20 1707 136/68 97.6 F (36.4 C) Oral 61 16 94 %  11/23/20 1517 (!) 113/49 -- -- (!) 54 17 94 %  11/23/20 1500 (!) 140/55 -- -- (!) 52 12 98 %  11/23/20 1415 (!) 125/58 -- -- (!) 49 15 98 %  11/23/20 1400 (!) 139/58 (!) 97.5 F (36.4 C) -- (!) 54 11 98 %  11/23/20 1345 125/61 -- -- 64 16 95 %  11/23/20 1330 (!) 130/56 -- -- 61 16 92 %  11/23/20 1315 (!) 119/47 -- -- (!) 59 11 98 %    '@flow'$ {1959:LAST@   Intake/Output from previous day:   08/01 0701 - 08/02 0700 In: 2489 [P.O.:240; I.V.:2049] Out: 2100 [Urine:2075]   Intake/Output this shift:   08/02 0701 - 08/02 1900 In: 240 [P.O.:240] Out: 750 [Urine:750]   Intake/Output      08/01 0701 08/02 0700 08/02 0701 08/03 0700   P.O. 240 240   I.V. (mL/kg) 2049 (25)    IV Piggyback 200    Total Intake(mL/kg) 2489 (30.3) 240 (2.9)   Urine (mL/kg/hr) 2075 750 (1.5)   Stool 0    Blood 25    Total Output 2100 750   Net +389 -510           LABORATORY DATA: Recent Labs    11/24/20 0406  WBC 12.1*  HGB 14.4  HCT 42.3   PLT 186   Recent Labs    11/24/20 0406  NA 138  K 4.2  CL 108  CO2 25  BUN 17  CREATININE 0.85  GLUCOSE 141*  CALCIUM 8.7*   No results found for: INR, PROTIME  Examination:  General appearance: alert, cooperative, and no distress Extremities: extremities normal, atraumatic, no cyanosis or edema  Wound Exam: clean, dry, intact   Drainage:  None: wound tissue dry  Motor Exam: Quadriceps and Hamstrings Intact  Sensory Exam: Superficial Peroneal, Deep Peroneal, and Tibial normal   Assessment:    1 Day Post-Op  Procedure(s) (LRB): SYNOVECTOMY WITH POLY EXCHANGE (Right)  ADDITIONAL DIAGNOSIS:  Active Problems:   S/P total knee replacement     Plan: Physical Therapy as ordered Weight Bearing as Tolerated (WBAT)  DVT Prophylaxis:  Aspirin  DISCHARGE PLAN: Home  Patient doing well and ready for D/C home       Patient's anticipated LOS is less than 2 midnights, meeting these requirements: - Lives within 1 hour of care - Has a competent adult at home to recover with post-op recover - NO history of  - Chronic pain requiring opiods  - Diabetes  - Coronary Artery Disease  - Heart failure  - Heart attack  - Stroke  - DVT/VTE  - Cardiac arrhythmia  - Respiratory Failure/COPD  - Renal failure  - Anemia  - Advanced Liver disease      Donia Ast 11/24/2020, 1:06 PM

## 2020-11-24 NOTE — Anesthesia Procedure Notes (Signed)
Anesthesia Regional Block: Adductor canal block   Pre-Anesthetic Checklist: , timeout performed,  Correct Patient, Correct Site, Correct Laterality,  Correct Procedure, Correct Position, site marked,  Risks and benefits discussed,  Surgical consent,  Pre-op evaluation,  At surgeon's request and post-op pain management  Laterality: Right and Lower  Prep: chloraprep       Needles:  Injection technique: Single-shot      Needle Length: 9cm  Needle Gauge: 22     Additional Needles: Arrow StimuQuik ECHO Echogenic Stimulating PNB Needle  Procedures:,,,, ultrasound used (permanent image in chart),,    Narrative:  Start time: 11/24/2020 11:19 AM End time: 11/24/2020 11:24 AM Injection made incrementally with aspirations every 5 mL.  Performed by: Personally  Anesthesiologist: Oleta Mouse, MD

## 2020-11-25 ENCOUNTER — Encounter (HOSPITAL_COMMUNITY): Payer: Self-pay | Admitting: Orthopedic Surgery

## 2020-11-30 DIAGNOSIS — M1711 Unilateral primary osteoarthritis, right knee: Secondary | ICD-10-CM | POA: Diagnosis not present

## 2020-11-30 DIAGNOSIS — M24561 Contracture, right knee: Secondary | ICD-10-CM | POA: Diagnosis not present

## 2020-11-30 DIAGNOSIS — Z96651 Presence of right artificial knee joint: Secondary | ICD-10-CM | POA: Diagnosis not present

## 2020-12-03 DIAGNOSIS — J3089 Other allergic rhinitis: Secondary | ICD-10-CM | POA: Diagnosis not present

## 2020-12-03 DIAGNOSIS — J301 Allergic rhinitis due to pollen: Secondary | ICD-10-CM | POA: Diagnosis not present

## 2020-12-03 DIAGNOSIS — M25461 Effusion, right knee: Secondary | ICD-10-CM | POA: Diagnosis not present

## 2020-12-03 DIAGNOSIS — Z96651 Presence of right artificial knee joint: Secondary | ICD-10-CM | POA: Diagnosis not present

## 2020-12-04 DIAGNOSIS — Z96651 Presence of right artificial knee joint: Secondary | ICD-10-CM | POA: Diagnosis not present

## 2020-12-04 DIAGNOSIS — M24561 Contracture, right knee: Secondary | ICD-10-CM | POA: Diagnosis not present

## 2020-12-04 DIAGNOSIS — M1711 Unilateral primary osteoarthritis, right knee: Secondary | ICD-10-CM | POA: Diagnosis not present

## 2020-12-07 DIAGNOSIS — M24561 Contracture, right knee: Secondary | ICD-10-CM | POA: Diagnosis not present

## 2020-12-07 DIAGNOSIS — Z96651 Presence of right artificial knee joint: Secondary | ICD-10-CM | POA: Diagnosis not present

## 2020-12-07 DIAGNOSIS — M1711 Unilateral primary osteoarthritis, right knee: Secondary | ICD-10-CM | POA: Diagnosis not present

## 2020-12-10 DIAGNOSIS — Z96651 Presence of right artificial knee joint: Secondary | ICD-10-CM | POA: Diagnosis not present

## 2020-12-10 DIAGNOSIS — M1711 Unilateral primary osteoarthritis, right knee: Secondary | ICD-10-CM | POA: Diagnosis not present

## 2020-12-10 DIAGNOSIS — M24561 Contracture, right knee: Secondary | ICD-10-CM | POA: Diagnosis not present

## 2020-12-14 DIAGNOSIS — Z96651 Presence of right artificial knee joint: Secondary | ICD-10-CM | POA: Diagnosis not present

## 2020-12-14 DIAGNOSIS — M1711 Unilateral primary osteoarthritis, right knee: Secondary | ICD-10-CM | POA: Diagnosis not present

## 2020-12-14 DIAGNOSIS — M24561 Contracture, right knee: Secondary | ICD-10-CM | POA: Diagnosis not present

## 2020-12-16 DIAGNOSIS — Z96651 Presence of right artificial knee joint: Secondary | ICD-10-CM | POA: Diagnosis not present

## 2020-12-16 DIAGNOSIS — M24561 Contracture, right knee: Secondary | ICD-10-CM | POA: Diagnosis not present

## 2020-12-16 DIAGNOSIS — M1711 Unilateral primary osteoarthritis, right knee: Secondary | ICD-10-CM | POA: Diagnosis not present

## 2020-12-21 DIAGNOSIS — M1711 Unilateral primary osteoarthritis, right knee: Secondary | ICD-10-CM | POA: Diagnosis not present

## 2020-12-21 DIAGNOSIS — M24561 Contracture, right knee: Secondary | ICD-10-CM | POA: Diagnosis not present

## 2020-12-21 DIAGNOSIS — Z96651 Presence of right artificial knee joint: Secondary | ICD-10-CM | POA: Diagnosis not present

## 2020-12-23 DIAGNOSIS — M24561 Contracture, right knee: Secondary | ICD-10-CM | POA: Diagnosis not present

## 2020-12-23 DIAGNOSIS — Z96651 Presence of right artificial knee joint: Secondary | ICD-10-CM | POA: Diagnosis not present

## 2020-12-23 DIAGNOSIS — M1711 Unilateral primary osteoarthritis, right knee: Secondary | ICD-10-CM | POA: Diagnosis not present

## 2020-12-25 DIAGNOSIS — D3131 Benign neoplasm of right choroid: Secondary | ICD-10-CM | POA: Diagnosis not present

## 2020-12-25 DIAGNOSIS — H04123 Dry eye syndrome of bilateral lacrimal glands: Secondary | ICD-10-CM | POA: Diagnosis not present

## 2020-12-29 DIAGNOSIS — M24561 Contracture, right knee: Secondary | ICD-10-CM | POA: Diagnosis not present

## 2020-12-29 DIAGNOSIS — Z96651 Presence of right artificial knee joint: Secondary | ICD-10-CM | POA: Diagnosis not present

## 2020-12-29 DIAGNOSIS — M1711 Unilateral primary osteoarthritis, right knee: Secondary | ICD-10-CM | POA: Diagnosis not present

## 2020-12-31 DIAGNOSIS — M24561 Contracture, right knee: Secondary | ICD-10-CM | POA: Diagnosis not present

## 2020-12-31 DIAGNOSIS — J3089 Other allergic rhinitis: Secondary | ICD-10-CM | POA: Diagnosis not present

## 2020-12-31 DIAGNOSIS — J301 Allergic rhinitis due to pollen: Secondary | ICD-10-CM | POA: Diagnosis not present

## 2020-12-31 DIAGNOSIS — Z96651 Presence of right artificial knee joint: Secondary | ICD-10-CM | POA: Diagnosis not present

## 2020-12-31 DIAGNOSIS — M1711 Unilateral primary osteoarthritis, right knee: Secondary | ICD-10-CM | POA: Diagnosis not present

## 2021-01-05 DIAGNOSIS — Z96651 Presence of right artificial knee joint: Secondary | ICD-10-CM | POA: Diagnosis not present

## 2021-01-05 DIAGNOSIS — M1711 Unilateral primary osteoarthritis, right knee: Secondary | ICD-10-CM | POA: Diagnosis not present

## 2021-01-05 DIAGNOSIS — M24561 Contracture, right knee: Secondary | ICD-10-CM | POA: Diagnosis not present

## 2021-01-07 DIAGNOSIS — M24561 Contracture, right knee: Secondary | ICD-10-CM | POA: Diagnosis not present

## 2021-01-07 DIAGNOSIS — Z96651 Presence of right artificial knee joint: Secondary | ICD-10-CM | POA: Diagnosis not present

## 2021-01-07 DIAGNOSIS — M1711 Unilateral primary osteoarthritis, right knee: Secondary | ICD-10-CM | POA: Diagnosis not present

## 2021-01-12 DIAGNOSIS — M1711 Unilateral primary osteoarthritis, right knee: Secondary | ICD-10-CM | POA: Diagnosis not present

## 2021-01-12 DIAGNOSIS — Z96651 Presence of right artificial knee joint: Secondary | ICD-10-CM | POA: Diagnosis not present

## 2021-01-12 DIAGNOSIS — M24561 Contracture, right knee: Secondary | ICD-10-CM | POA: Diagnosis not present

## 2021-01-14 DIAGNOSIS — M24561 Contracture, right knee: Secondary | ICD-10-CM | POA: Diagnosis not present

## 2021-01-14 DIAGNOSIS — Z96651 Presence of right artificial knee joint: Secondary | ICD-10-CM | POA: Diagnosis not present

## 2021-01-14 DIAGNOSIS — M1711 Unilateral primary osteoarthritis, right knee: Secondary | ICD-10-CM | POA: Diagnosis not present

## 2021-01-19 DIAGNOSIS — M24561 Contracture, right knee: Secondary | ICD-10-CM | POA: Diagnosis not present

## 2021-01-19 DIAGNOSIS — Z96651 Presence of right artificial knee joint: Secondary | ICD-10-CM | POA: Diagnosis not present

## 2021-01-19 DIAGNOSIS — M1711 Unilateral primary osteoarthritis, right knee: Secondary | ICD-10-CM | POA: Diagnosis not present

## 2021-01-21 DIAGNOSIS — Z96651 Presence of right artificial knee joint: Secondary | ICD-10-CM | POA: Diagnosis not present

## 2021-01-21 DIAGNOSIS — M24561 Contracture, right knee: Secondary | ICD-10-CM | POA: Diagnosis not present

## 2021-01-21 DIAGNOSIS — M1711 Unilateral primary osteoarthritis, right knee: Secondary | ICD-10-CM | POA: Diagnosis not present

## 2021-01-25 DIAGNOSIS — K2100 Gastro-esophageal reflux disease with esophagitis, without bleeding: Secondary | ICD-10-CM | POA: Diagnosis not present

## 2021-01-25 DIAGNOSIS — Z86010 Personal history of colonic polyps: Secondary | ICD-10-CM | POA: Diagnosis not present

## 2021-01-27 DIAGNOSIS — Z96651 Presence of right artificial knee joint: Secondary | ICD-10-CM | POA: Diagnosis not present

## 2021-01-27 DIAGNOSIS — M24561 Contracture, right knee: Secondary | ICD-10-CM | POA: Diagnosis not present

## 2021-01-27 DIAGNOSIS — M1711 Unilateral primary osteoarthritis, right knee: Secondary | ICD-10-CM | POA: Diagnosis not present

## 2021-01-28 DIAGNOSIS — J301 Allergic rhinitis due to pollen: Secondary | ICD-10-CM | POA: Diagnosis not present

## 2021-01-28 DIAGNOSIS — J3089 Other allergic rhinitis: Secondary | ICD-10-CM | POA: Diagnosis not present

## 2021-01-29 DIAGNOSIS — M24561 Contracture, right knee: Secondary | ICD-10-CM | POA: Diagnosis not present

## 2021-01-29 DIAGNOSIS — M1711 Unilateral primary osteoarthritis, right knee: Secondary | ICD-10-CM | POA: Diagnosis not present

## 2021-01-29 DIAGNOSIS — Z96651 Presence of right artificial knee joint: Secondary | ICD-10-CM | POA: Diagnosis not present

## 2021-02-02 DIAGNOSIS — M1711 Unilateral primary osteoarthritis, right knee: Secondary | ICD-10-CM | POA: Diagnosis not present

## 2021-02-02 DIAGNOSIS — M24561 Contracture, right knee: Secondary | ICD-10-CM | POA: Diagnosis not present

## 2021-02-02 DIAGNOSIS — Z96651 Presence of right artificial knee joint: Secondary | ICD-10-CM | POA: Diagnosis not present

## 2021-02-11 DIAGNOSIS — M24561 Contracture, right knee: Secondary | ICD-10-CM | POA: Diagnosis not present

## 2021-02-11 DIAGNOSIS — Z96651 Presence of right artificial knee joint: Secondary | ICD-10-CM | POA: Diagnosis not present

## 2021-02-11 DIAGNOSIS — M1711 Unilateral primary osteoarthritis, right knee: Secondary | ICD-10-CM | POA: Diagnosis not present

## 2021-02-18 DIAGNOSIS — Z96651 Presence of right artificial knee joint: Secondary | ICD-10-CM | POA: Diagnosis not present

## 2021-02-18 DIAGNOSIS — M1711 Unilateral primary osteoarthritis, right knee: Secondary | ICD-10-CM | POA: Diagnosis not present

## 2021-02-18 DIAGNOSIS — M24561 Contracture, right knee: Secondary | ICD-10-CM | POA: Diagnosis not present

## 2021-02-23 DIAGNOSIS — Z96651 Presence of right artificial knee joint: Secondary | ICD-10-CM | POA: Diagnosis not present

## 2021-02-23 DIAGNOSIS — M1711 Unilateral primary osteoarthritis, right knee: Secondary | ICD-10-CM | POA: Diagnosis not present

## 2021-02-23 DIAGNOSIS — M24561 Contracture, right knee: Secondary | ICD-10-CM | POA: Diagnosis not present

## 2021-02-25 DIAGNOSIS — J301 Allergic rhinitis due to pollen: Secondary | ICD-10-CM | POA: Diagnosis not present

## 2021-02-25 DIAGNOSIS — J3089 Other allergic rhinitis: Secondary | ICD-10-CM | POA: Diagnosis not present

## 2021-02-25 DIAGNOSIS — M1711 Unilateral primary osteoarthritis, right knee: Secondary | ICD-10-CM | POA: Diagnosis not present

## 2021-02-25 DIAGNOSIS — Z96651 Presence of right artificial knee joint: Secondary | ICD-10-CM | POA: Diagnosis not present

## 2021-02-25 DIAGNOSIS — M24561 Contracture, right knee: Secondary | ICD-10-CM | POA: Diagnosis not present

## 2021-03-02 DIAGNOSIS — M24561 Contracture, right knee: Secondary | ICD-10-CM | POA: Diagnosis not present

## 2021-03-02 DIAGNOSIS — M1711 Unilateral primary osteoarthritis, right knee: Secondary | ICD-10-CM | POA: Diagnosis not present

## 2021-03-02 DIAGNOSIS — Z96651 Presence of right artificial knee joint: Secondary | ICD-10-CM | POA: Diagnosis not present

## 2021-03-04 DIAGNOSIS — Z96651 Presence of right artificial knee joint: Secondary | ICD-10-CM | POA: Diagnosis not present

## 2021-03-04 DIAGNOSIS — M24561 Contracture, right knee: Secondary | ICD-10-CM | POA: Diagnosis not present

## 2021-03-04 DIAGNOSIS — M1711 Unilateral primary osteoarthritis, right knee: Secondary | ICD-10-CM | POA: Diagnosis not present

## 2021-03-09 DIAGNOSIS — M24561 Contracture, right knee: Secondary | ICD-10-CM | POA: Diagnosis not present

## 2021-03-09 DIAGNOSIS — Z96651 Presence of right artificial knee joint: Secondary | ICD-10-CM | POA: Diagnosis not present

## 2021-03-09 DIAGNOSIS — M1711 Unilateral primary osteoarthritis, right knee: Secondary | ICD-10-CM | POA: Diagnosis not present

## 2021-03-11 DIAGNOSIS — M24561 Contracture, right knee: Secondary | ICD-10-CM | POA: Diagnosis not present

## 2021-03-11 DIAGNOSIS — M1711 Unilateral primary osteoarthritis, right knee: Secondary | ICD-10-CM | POA: Diagnosis not present

## 2021-03-11 DIAGNOSIS — Z96651 Presence of right artificial knee joint: Secondary | ICD-10-CM | POA: Diagnosis not present

## 2021-03-17 DIAGNOSIS — M24561 Contracture, right knee: Secondary | ICD-10-CM | POA: Diagnosis not present

## 2021-03-17 DIAGNOSIS — M1711 Unilateral primary osteoarthritis, right knee: Secondary | ICD-10-CM | POA: Diagnosis not present

## 2021-03-17 DIAGNOSIS — Z96651 Presence of right artificial knee joint: Secondary | ICD-10-CM | POA: Diagnosis not present

## 2021-03-28 DIAGNOSIS — R051 Acute cough: Secondary | ICD-10-CM | POA: Diagnosis not present

## 2021-03-28 DIAGNOSIS — R0981 Nasal congestion: Secondary | ICD-10-CM | POA: Diagnosis not present

## 2021-04-06 DIAGNOSIS — J3089 Other allergic rhinitis: Secondary | ICD-10-CM | POA: Diagnosis not present

## 2021-04-06 DIAGNOSIS — J301 Allergic rhinitis due to pollen: Secondary | ICD-10-CM | POA: Diagnosis not present

## 2021-04-06 DIAGNOSIS — Z96651 Presence of right artificial knee joint: Secondary | ICD-10-CM | POA: Diagnosis not present

## 2021-04-06 DIAGNOSIS — M24561 Contracture, right knee: Secondary | ICD-10-CM | POA: Diagnosis not present

## 2021-04-06 DIAGNOSIS — M1711 Unilateral primary osteoarthritis, right knee: Secondary | ICD-10-CM | POA: Diagnosis not present

## 2021-04-08 DIAGNOSIS — Z96651 Presence of right artificial knee joint: Secondary | ICD-10-CM | POA: Diagnosis not present

## 2021-05-06 DIAGNOSIS — L570 Actinic keratosis: Secondary | ICD-10-CM | POA: Diagnosis not present

## 2021-05-06 DIAGNOSIS — Z23 Encounter for immunization: Secondary | ICD-10-CM | POA: Diagnosis not present

## 2021-05-06 DIAGNOSIS — Z85828 Personal history of other malignant neoplasm of skin: Secondary | ICD-10-CM | POA: Diagnosis not present

## 2021-05-06 DIAGNOSIS — D225 Melanocytic nevi of trunk: Secondary | ICD-10-CM | POA: Diagnosis not present

## 2021-05-06 DIAGNOSIS — L821 Other seborrheic keratosis: Secondary | ICD-10-CM | POA: Diagnosis not present

## 2021-05-06 DIAGNOSIS — L578 Other skin changes due to chronic exposure to nonionizing radiation: Secondary | ICD-10-CM | POA: Diagnosis not present

## 2021-05-06 DIAGNOSIS — Z86018 Personal history of other benign neoplasm: Secondary | ICD-10-CM | POA: Diagnosis not present

## 2021-05-06 DIAGNOSIS — D2271 Melanocytic nevi of right lower limb, including hip: Secondary | ICD-10-CM | POA: Diagnosis not present

## 2021-05-06 DIAGNOSIS — L219 Seborrheic dermatitis, unspecified: Secondary | ICD-10-CM | POA: Diagnosis not present

## 2021-05-11 DIAGNOSIS — J301 Allergic rhinitis due to pollen: Secondary | ICD-10-CM | POA: Diagnosis not present

## 2021-05-11 DIAGNOSIS — J3089 Other allergic rhinitis: Secondary | ICD-10-CM | POA: Diagnosis not present

## 2021-05-23 DIAGNOSIS — I10 Essential (primary) hypertension: Secondary | ICD-10-CM | POA: Diagnosis not present

## 2021-05-23 DIAGNOSIS — I700 Atherosclerosis of aorta: Secondary | ICD-10-CM | POA: Diagnosis not present

## 2021-05-23 DIAGNOSIS — E785 Hyperlipidemia, unspecified: Secondary | ICD-10-CM | POA: Diagnosis not present

## 2021-05-26 DIAGNOSIS — Z09 Encounter for follow-up examination after completed treatment for conditions other than malignant neoplasm: Secondary | ICD-10-CM | POA: Diagnosis not present

## 2021-05-26 DIAGNOSIS — K5730 Diverticulosis of large intestine without perforation or abscess without bleeding: Secondary | ICD-10-CM | POA: Diagnosis not present

## 2021-05-26 DIAGNOSIS — K648 Other hemorrhoids: Secondary | ICD-10-CM | POA: Diagnosis not present

## 2021-05-26 DIAGNOSIS — Z86010 Personal history of colonic polyps: Secondary | ICD-10-CM | POA: Diagnosis not present

## 2021-06-01 DIAGNOSIS — J301 Allergic rhinitis due to pollen: Secondary | ICD-10-CM | POA: Diagnosis not present

## 2021-06-01 DIAGNOSIS — J3089 Other allergic rhinitis: Secondary | ICD-10-CM | POA: Diagnosis not present

## 2021-06-10 DIAGNOSIS — N200 Calculus of kidney: Secondary | ICD-10-CM | POA: Diagnosis not present

## 2021-06-10 DIAGNOSIS — R351 Nocturia: Secondary | ICD-10-CM | POA: Diagnosis not present

## 2021-06-10 DIAGNOSIS — N401 Enlarged prostate with lower urinary tract symptoms: Secondary | ICD-10-CM | POA: Diagnosis not present

## 2021-06-11 DIAGNOSIS — H1045 Other chronic allergic conjunctivitis: Secondary | ICD-10-CM | POA: Diagnosis not present

## 2021-06-11 DIAGNOSIS — J3089 Other allergic rhinitis: Secondary | ICD-10-CM | POA: Diagnosis not present

## 2021-06-11 DIAGNOSIS — J3081 Allergic rhinitis due to animal (cat) (dog) hair and dander: Secondary | ICD-10-CM | POA: Diagnosis not present

## 2021-06-11 DIAGNOSIS — J301 Allergic rhinitis due to pollen: Secondary | ICD-10-CM | POA: Diagnosis not present

## 2021-06-16 DIAGNOSIS — J3089 Other allergic rhinitis: Secondary | ICD-10-CM | POA: Diagnosis not present

## 2021-06-16 DIAGNOSIS — J301 Allergic rhinitis due to pollen: Secondary | ICD-10-CM | POA: Diagnosis not present

## 2021-06-29 DIAGNOSIS — J301 Allergic rhinitis due to pollen: Secondary | ICD-10-CM | POA: Diagnosis not present

## 2021-06-29 DIAGNOSIS — J3089 Other allergic rhinitis: Secondary | ICD-10-CM | POA: Diagnosis not present

## 2021-07-01 DIAGNOSIS — J301 Allergic rhinitis due to pollen: Secondary | ICD-10-CM | POA: Diagnosis not present

## 2021-07-01 DIAGNOSIS — J3089 Other allergic rhinitis: Secondary | ICD-10-CM | POA: Diagnosis not present

## 2021-07-05 DIAGNOSIS — J301 Allergic rhinitis due to pollen: Secondary | ICD-10-CM | POA: Diagnosis not present

## 2021-07-05 DIAGNOSIS — J3089 Other allergic rhinitis: Secondary | ICD-10-CM | POA: Diagnosis not present

## 2021-07-29 DIAGNOSIS — M7062 Trochanteric bursitis, left hip: Secondary | ICD-10-CM | POA: Diagnosis not present

## 2021-08-02 DIAGNOSIS — J3089 Other allergic rhinitis: Secondary | ICD-10-CM | POA: Diagnosis not present

## 2021-08-02 DIAGNOSIS — J301 Allergic rhinitis due to pollen: Secondary | ICD-10-CM | POA: Diagnosis not present

## 2021-08-04 DIAGNOSIS — N201 Calculus of ureter: Secondary | ICD-10-CM | POA: Diagnosis not present

## 2021-08-16 DIAGNOSIS — N202 Calculus of kidney with calculus of ureter: Secondary | ICD-10-CM | POA: Diagnosis not present

## 2021-08-23 DIAGNOSIS — J301 Allergic rhinitis due to pollen: Secondary | ICD-10-CM | POA: Diagnosis not present

## 2021-08-23 DIAGNOSIS — J3081 Allergic rhinitis due to animal (cat) (dog) hair and dander: Secondary | ICD-10-CM | POA: Diagnosis not present

## 2021-08-23 DIAGNOSIS — J3089 Other allergic rhinitis: Secondary | ICD-10-CM | POA: Diagnosis not present

## 2021-08-24 DIAGNOSIS — D485 Neoplasm of uncertain behavior of skin: Secondary | ICD-10-CM | POA: Diagnosis not present

## 2021-08-24 DIAGNOSIS — L570 Actinic keratosis: Secondary | ICD-10-CM | POA: Diagnosis not present

## 2021-08-24 DIAGNOSIS — C44219 Basal cell carcinoma of skin of left ear and external auricular canal: Secondary | ICD-10-CM | POA: Diagnosis not present

## 2021-09-07 DIAGNOSIS — N202 Calculus of kidney with calculus of ureter: Secondary | ICD-10-CM | POA: Diagnosis not present

## 2021-09-21 DIAGNOSIS — J3089 Other allergic rhinitis: Secondary | ICD-10-CM | POA: Diagnosis not present

## 2021-09-21 DIAGNOSIS — J301 Allergic rhinitis due to pollen: Secondary | ICD-10-CM | POA: Diagnosis not present

## 2021-09-21 DIAGNOSIS — J3081 Allergic rhinitis due to animal (cat) (dog) hair and dander: Secondary | ICD-10-CM | POA: Diagnosis not present

## 2021-10-18 DIAGNOSIS — J3081 Allergic rhinitis due to animal (cat) (dog) hair and dander: Secondary | ICD-10-CM | POA: Diagnosis not present

## 2021-10-18 DIAGNOSIS — M7062 Trochanteric bursitis, left hip: Secondary | ICD-10-CM | POA: Diagnosis not present

## 2021-10-18 DIAGNOSIS — M25552 Pain in left hip: Secondary | ICD-10-CM | POA: Diagnosis not present

## 2021-10-18 DIAGNOSIS — J301 Allergic rhinitis due to pollen: Secondary | ICD-10-CM | POA: Diagnosis not present

## 2021-10-18 DIAGNOSIS — M5030 Other cervical disc degeneration, unspecified cervical region: Secondary | ICD-10-CM | POA: Diagnosis not present

## 2021-10-18 DIAGNOSIS — J3089 Other allergic rhinitis: Secondary | ICD-10-CM | POA: Diagnosis not present

## 2021-10-18 DIAGNOSIS — M542 Cervicalgia: Secondary | ICD-10-CM | POA: Diagnosis not present

## 2021-10-20 DIAGNOSIS — J301 Allergic rhinitis due to pollen: Secondary | ICD-10-CM | POA: Diagnosis not present

## 2021-10-20 DIAGNOSIS — J3089 Other allergic rhinitis: Secondary | ICD-10-CM | POA: Diagnosis not present

## 2021-10-28 DIAGNOSIS — J3089 Other allergic rhinitis: Secondary | ICD-10-CM | POA: Diagnosis not present

## 2021-10-28 DIAGNOSIS — J3081 Allergic rhinitis due to animal (cat) (dog) hair and dander: Secondary | ICD-10-CM | POA: Diagnosis not present

## 2021-10-28 DIAGNOSIS — J301 Allergic rhinitis due to pollen: Secondary | ICD-10-CM | POA: Diagnosis not present

## 2021-11-01 DIAGNOSIS — J301 Allergic rhinitis due to pollen: Secondary | ICD-10-CM | POA: Diagnosis not present

## 2021-11-01 DIAGNOSIS — J3089 Other allergic rhinitis: Secondary | ICD-10-CM | POA: Diagnosis not present

## 2021-11-03 DIAGNOSIS — R7301 Impaired fasting glucose: Secondary | ICD-10-CM | POA: Diagnosis not present

## 2021-11-03 DIAGNOSIS — E785 Hyperlipidemia, unspecified: Secondary | ICD-10-CM | POA: Diagnosis not present

## 2021-11-03 DIAGNOSIS — R7989 Other specified abnormal findings of blood chemistry: Secondary | ICD-10-CM | POA: Diagnosis not present

## 2021-11-03 DIAGNOSIS — Z125 Encounter for screening for malignant neoplasm of prostate: Secondary | ICD-10-CM | POA: Diagnosis not present

## 2021-11-03 DIAGNOSIS — I10 Essential (primary) hypertension: Secondary | ICD-10-CM | POA: Diagnosis not present

## 2021-11-04 DIAGNOSIS — J301 Allergic rhinitis due to pollen: Secondary | ICD-10-CM | POA: Diagnosis not present

## 2021-11-04 DIAGNOSIS — J3081 Allergic rhinitis due to animal (cat) (dog) hair and dander: Secondary | ICD-10-CM | POA: Diagnosis not present

## 2021-11-04 DIAGNOSIS — J3089 Other allergic rhinitis: Secondary | ICD-10-CM | POA: Diagnosis not present

## 2021-11-10 DIAGNOSIS — N400 Enlarged prostate without lower urinary tract symptoms: Secondary | ICD-10-CM | POA: Diagnosis not present

## 2021-11-10 DIAGNOSIS — Z0000 Encounter for general adult medical examination without abnormal findings: Secondary | ICD-10-CM | POA: Diagnosis not present

## 2021-11-10 DIAGNOSIS — G4700 Insomnia, unspecified: Secondary | ICD-10-CM | POA: Diagnosis not present

## 2021-11-10 DIAGNOSIS — Z1331 Encounter for screening for depression: Secondary | ICD-10-CM | POA: Diagnosis not present

## 2021-11-10 DIAGNOSIS — K5790 Diverticulosis of intestine, part unspecified, without perforation or abscess without bleeding: Secondary | ICD-10-CM | POA: Diagnosis not present

## 2021-11-10 DIAGNOSIS — Z1339 Encounter for screening examination for other mental health and behavioral disorders: Secondary | ICD-10-CM | POA: Diagnosis not present

## 2021-11-10 DIAGNOSIS — R7301 Impaired fasting glucose: Secondary | ICD-10-CM | POA: Diagnosis not present

## 2021-11-10 DIAGNOSIS — E785 Hyperlipidemia, unspecified: Secondary | ICD-10-CM | POA: Diagnosis not present

## 2021-11-10 DIAGNOSIS — I10 Essential (primary) hypertension: Secondary | ICD-10-CM | POA: Diagnosis not present

## 2021-11-10 DIAGNOSIS — M5416 Radiculopathy, lumbar region: Secondary | ICD-10-CM | POA: Diagnosis not present

## 2021-11-10 DIAGNOSIS — G729 Myopathy, unspecified: Secondary | ICD-10-CM | POA: Diagnosis not present

## 2021-11-10 DIAGNOSIS — Z96651 Presence of right artificial knee joint: Secondary | ICD-10-CM | POA: Diagnosis not present

## 2021-11-10 DIAGNOSIS — T8484XD Pain due to internal orthopedic prosthetic devices, implants and grafts, subsequent encounter: Secondary | ICD-10-CM | POA: Diagnosis not present

## 2021-11-10 DIAGNOSIS — I700 Atherosclerosis of aorta: Secondary | ICD-10-CM | POA: Diagnosis not present

## 2021-11-17 ENCOUNTER — Other Ambulatory Visit: Payer: Self-pay | Admitting: Cardiology

## 2021-11-22 DIAGNOSIS — J3089 Other allergic rhinitis: Secondary | ICD-10-CM | POA: Diagnosis not present

## 2021-11-22 DIAGNOSIS — J301 Allergic rhinitis due to pollen: Secondary | ICD-10-CM | POA: Diagnosis not present

## 2021-11-24 DIAGNOSIS — M7062 Trochanteric bursitis, left hip: Secondary | ICD-10-CM | POA: Diagnosis not present

## 2021-11-24 DIAGNOSIS — M25552 Pain in left hip: Secondary | ICD-10-CM | POA: Diagnosis not present

## 2021-11-24 DIAGNOSIS — M542 Cervicalgia: Secondary | ICD-10-CM | POA: Diagnosis not present

## 2021-11-24 DIAGNOSIS — M5030 Other cervical disc degeneration, unspecified cervical region: Secondary | ICD-10-CM | POA: Diagnosis not present

## 2021-11-25 DIAGNOSIS — C44219 Basal cell carcinoma of skin of left ear and external auricular canal: Secondary | ICD-10-CM | POA: Diagnosis not present

## 2021-12-15 ENCOUNTER — Other Ambulatory Visit: Payer: Self-pay | Admitting: Cardiology

## 2021-12-20 DIAGNOSIS — J3089 Other allergic rhinitis: Secondary | ICD-10-CM | POA: Diagnosis not present

## 2021-12-20 DIAGNOSIS — J301 Allergic rhinitis due to pollen: Secondary | ICD-10-CM | POA: Diagnosis not present

## 2021-12-20 DIAGNOSIS — J3081 Allergic rhinitis due to animal (cat) (dog) hair and dander: Secondary | ICD-10-CM | POA: Diagnosis not present

## 2022-01-10 ENCOUNTER — Other Ambulatory Visit: Payer: Self-pay | Admitting: Cardiology

## 2022-01-17 ENCOUNTER — Telehealth: Payer: Self-pay | Admitting: Cardiology

## 2022-01-17 DIAGNOSIS — J301 Allergic rhinitis due to pollen: Secondary | ICD-10-CM | POA: Diagnosis not present

## 2022-01-17 DIAGNOSIS — J3089 Other allergic rhinitis: Secondary | ICD-10-CM | POA: Diagnosis not present

## 2022-01-17 DIAGNOSIS — J3081 Allergic rhinitis due to animal (cat) (dog) hair and dander: Secondary | ICD-10-CM | POA: Diagnosis not present

## 2022-01-17 NOTE — Telephone Encounter (Signed)
Called patient, advised that for the last 2 weeks he has noticed increased SOB with activities- the last week he has noticed it even more with just sitting. He denies swelling, weight gain, chest discomfort. He states he has not had any medication changes, and his BP ranges from 130-142/60's. He does not pay attention to his HR to give this information. Last OV was in 2022 with Dr.Hochrein who suggested an as needed follow up- I did get him scheduled for Friday at 9:40 AM (patient verbalized understanding of this appointment) we advised he may need an EKG and to discuss his SOB concerns further.   Will route to MD to make aware.  Thanks!

## 2022-01-17 NOTE — Telephone Encounter (Signed)
Pt c/o Shortness Of Breath: STAT if SOB developed within the last 24 hours or pt is noticeably SOB on the phone  1. Are you currently SOB (can you hear that pt is SOB on the phone)?   No  2. How long have you been experiencing SOB?  Two weeks  3. Are you SOB when sitting or when up moving around?   When walking  4. Are you currently experiencing any other symptoms?   No  Patient stated when he is walking, working out at the gym or playing golf.

## 2022-01-18 DIAGNOSIS — I10 Essential (primary) hypertension: Secondary | ICD-10-CM | POA: Diagnosis not present

## 2022-01-18 DIAGNOSIS — Z87891 Personal history of nicotine dependence: Secondary | ICD-10-CM | POA: Diagnosis not present

## 2022-01-18 DIAGNOSIS — Z7982 Long term (current) use of aspirin: Secondary | ICD-10-CM | POA: Diagnosis not present

## 2022-01-18 DIAGNOSIS — R011 Cardiac murmur, unspecified: Secondary | ICD-10-CM | POA: Diagnosis not present

## 2022-01-18 DIAGNOSIS — E669 Obesity, unspecified: Secondary | ICD-10-CM | POA: Diagnosis not present

## 2022-01-18 DIAGNOSIS — E876 Hypokalemia: Secondary | ICD-10-CM | POA: Diagnosis not present

## 2022-01-18 DIAGNOSIS — N400 Enlarged prostate without lower urinary tract symptoms: Secondary | ICD-10-CM | POA: Diagnosis not present

## 2022-01-18 DIAGNOSIS — E785 Hyperlipidemia, unspecified: Secondary | ICD-10-CM | POA: Diagnosis not present

## 2022-01-18 DIAGNOSIS — G250 Essential tremor: Secondary | ICD-10-CM | POA: Diagnosis not present

## 2022-01-18 DIAGNOSIS — L259 Unspecified contact dermatitis, unspecified cause: Secondary | ICD-10-CM | POA: Diagnosis not present

## 2022-01-19 ENCOUNTER — Ambulatory Visit: Payer: PPO | Admitting: Podiatry

## 2022-01-19 DIAGNOSIS — D485 Neoplasm of uncertain behavior of skin: Secondary | ICD-10-CM | POA: Diagnosis not present

## 2022-01-19 DIAGNOSIS — C44219 Basal cell carcinoma of skin of left ear and external auricular canal: Secondary | ICD-10-CM | POA: Diagnosis not present

## 2022-01-19 DIAGNOSIS — L578 Other skin changes due to chronic exposure to nonionizing radiation: Secondary | ICD-10-CM | POA: Diagnosis not present

## 2022-01-19 DIAGNOSIS — L570 Actinic keratosis: Secondary | ICD-10-CM | POA: Diagnosis not present

## 2022-01-20 ENCOUNTER — Ambulatory Visit: Payer: PPO | Admitting: Podiatry

## 2022-01-20 ENCOUNTER — Encounter: Payer: Self-pay | Admitting: Podiatry

## 2022-01-20 ENCOUNTER — Ambulatory Visit (INDEPENDENT_AMBULATORY_CARE_PROVIDER_SITE_OTHER): Payer: PPO

## 2022-01-20 DIAGNOSIS — L84 Corns and callosities: Secondary | ICD-10-CM | POA: Diagnosis not present

## 2022-01-20 DIAGNOSIS — D3610 Benign neoplasm of peripheral nerves and autonomic nervous system, unspecified: Secondary | ICD-10-CM | POA: Diagnosis not present

## 2022-01-20 DIAGNOSIS — M779 Enthesopathy, unspecified: Secondary | ICD-10-CM

## 2022-01-20 NOTE — Progress Notes (Signed)
Subjective:   Patient ID: Ross [name]., male   DOB: 81 y.o.   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  Patient does not currently smoke likes to be active   Review of Systems  All other systems reviewed and are negative.       Objective:  Physical Exam Vitals and nursing note reviewed.  Constitutional:      Appearance: He is well-developed.  Pulmonary:     Effort: Pulmonary effort is normal.  Musculoskeletal:        General: Normal range of motion.  Skin:    General: Skin is warm.  Neurological:     Mental Status: He is alert.     Neurovascular status found to be intact muscle strength was found to be adequate range of motion adequate.  Patient is noted to have moderate depression of the arch with keratotic tissue formation left hallux left first metatarsal that can become tender when it gets enlarged with a amount of grizzle in the third interspace left mild discomfort and discomfort when I inverted the foot which may radiate from the sinus tarsi     Assessment:  Callus formation symptomatic left x2 possibility for neuroma symptomatology left with history of resection 20 years ago and possibility for sinus tarsitis or inflammatory capsulitis     Plan:  H&P conditions reviewed and since the symptoms are mild on lateral foot and he feels like it is improving organ to defer from any form of aggressive treatment and for the calluses I went ahead debrided callus x2 left with no iatrogenic bleeding and patient will be seen back to recheck as needed.  May require surgical resection if neuroma starts to become more of an issue  X-ray was negative for signs of fracture or aggressive arthritis there is some  tightening of the third and fourth metatarsals left

## 2022-01-20 NOTE — Progress Notes (Signed)
Cardiology Office Note   Date:  01/21/2022   ID:  Ross Brining., DOB Sep 22, 1940, MRN [id number]  PCP:  Ginger Organ., MD  Cardiologist:   Minus Breeding, MD   Chief Complaint  Patient presents with   Shortness of Breath      History of Present Illness: Ross Lord. is a 81 y.o. male who presents for evaluation of aortic calcification. He's being evaluated for kidney stone and had a CT. He had some calcium in his aorta down into his iliacs.  Echo in 2012 demonstrated normal left ventricular function with some mild aortic valve calcification. He's had minimal plaque on carotid Dopplers. Stress perfusion study in 2008 demonstrated no evidence of ischemia. POET (Plain Old Exercise Treadmill) in 2016 had some nonspecific ST changes but no clear evidence of ischemia.  He returns for follow up.      Since I last saw him he has had some increasing shortness of breath.  This happens when he is trying to do some walking.  He has not been having this before.  This is relatively new onset.  He will notice it climbing up a little hill.  He does not have at rest.  He is not having any PND or orthopnea.  He has had 2 episodes of chest discomfort.  These have been a circumscribed left-sided discomfort that happened twice probably with the most recent about 2 months ago.  This has not been reproducible.  He cannot bring it on with activity.  He is not having any new palpitations, presyncope or syncope.  He has had no weight gain or edema.   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   Essential and other specified forms of tremor 08/30/2013   History of kidney stones    Hypertension    Kidney stones    Stroke [facility]) 2005    Past Surgical History:  Procedure Laterality Date   APPENDECTOMY     CATARACT EXTRACTION     bilateral   CHOLECYSTECTOMY     CYSTOSCOPY WITH RETROGRADE PYELOGRAM, URETEROSCOPY AND STENT PLACEMENT Left 06/11/2013    Procedure: CYSTOSCOPY WITH RETROGRADE Left PYELOGRAM, URETEROSCOPY AND Left STENT PLACEMENT;  Surgeon: Ailene Rud, MD;  Location: [facility];  Service: Urology;  Laterality: Left;   EXTRACORPOREAL SHOCK WAVE LITHOTRIPSY Left 07/06/2020   Procedure: EXTRACORPOREAL SHOCK WAVE LITHOTRIPSY (ESWL);  Surgeon: Ardis Hughs, MD;  [facility];  Service: Urology;  Laterality: Left;   KNEE SURGERY     bilateral arthroscopic   LUMBAR LAMINECTOMY/DECOMPRESSION MICRODISCECTOMY N/A 11/11/2019   Procedure: Lumbar four-five Laminectomy for facet/synovial cyst;  Surgeon: Kary Kos, MD;  Location: [facility];  Service: Neurosurgery;  Laterality: N/A;   SHOULDER SURGERY     right    SYNOVECTOMY WITH POLY EXCHANGE Right 11/23/2020   Procedure: SYNOVECTOMY WITH POLY EXCHANGE;  Surgeon: Vickey Huger, MD;  Location: [facility];  Service: Orthopedics;  Laterality: Right;   TOTAL KNEE ARTHROPLASTY Right 12/06/2016   Procedure: RIGHT TOTAL KNEE ARTHROPLASTY;  Surgeon: Paralee Cancel, MD;  Location: [facility];  Service: Orthopedics;  Laterality: Right;  70 mins   TRIGGER FINGER RELEASE Bilateral      Current Outpatient Medications  Medication Sig Dispense Refill   b complex vitamins tablet Take 1 tablet by mouth daily.      cholecalciferol (VITAMIN D3) 25 MCG (1000 UNIT) tablet Take 1,000 Units  by mouth daily.     CINNAMON PO Take 2 tablets by mouth daily.     EPINEPHrine 0.3 mg/0.3 mL IJ SOAJ injection Inject 0.3 mg into the muscle as needed for anaphylaxis.      ezetimibe (ZETIA) 10 MG tablet TAKE ONE TABLET BY MOUTH ONE TIME DAILY 30 tablet 0   fluticasone (FLONASE) 50 MCG/ACT nasal spray Place 1 spray into both nostrils daily.     ketoconazole (NIZORAL) 2 % cream Apply 1 application topically daily as needed for irritation.     lisinopril-hydrochlorothiazide (ZESTORETIC) 20-12.5 MG tablet Take 1 tablet by mouth daily.     Multiple Vitamin (MULTIVITAMIN) tablet Take 1 tablet by mouth daily.       Multiple Vitamins-Minerals (PRESERVISION AREDS 2 PO) Take 1 capsule by mouth daily.     Potassium Citrate 15 MEQ (1620 MG) TBCR Take 2 tablets by mouth in the morning and at bedtime.     PRESCRIPTION MEDICATION Inject 1 each into the skin every 30 (thirty) days. Allergy shot     Propylene Glycol (SYSTANE COMPLETE) 0.6 % SOLN Place 1 drop into both eyes in the morning, at noon, in the evening, and at bedtime.     psyllium (METAMUCIL) 58.6 % packet Take 0.5 packets by mouth daily.     tamsulosin (FLOMAX) 0.4 MG CAPS capsule Take 0.4 mg by mouth 2 (two) times daily.     aspirin EC 81 MG tablet Take 1 tablet (81 mg total) by mouth daily. 90 tablet 3   No current facility-administered medications for this visit.    Allergies:   Dexlansoprazole    ROS:  Please see the history of present illness.   Otherwise, review of systems are positive for none.   All other systems are reviewed and negative.    PHYSICAL EXAM: VS:  BP 128/72   Pulse 63   Ht 5' 7.5" (1.715 m)   Wt 181 lb 6.4 oz (82.3 kg)   SpO2 (!) 63%   BMI 27.99 kg/m  , BMI Body mass index is 27.99 kg/m.  GENERAL:  Well appearing NECK:  No jugular venous distention, waveform within normal limits, carotid upstroke brisk and symmetric, no bruits, no thyromegaly LUNGS:  Clear to auscultation bilaterally CHEST:  Unremarkable HEART:  PMI not displaced or sustained,S1 and S2 within normal limits, no S3, no S4, no clicks, no rubs, no murmurs ABD:  Flat, positive bowel sounds normal in frequency in pitch, no bruits, no rebound, no guarding, no midline pulsatile mass, no hepatomegaly, no splenomegaly EXT:  2 plus pulses throughout, no edema, no cyanosis no clubbing  EKG:  EKG is   ordered today. The ekg ordered today demonstrates sinus rhythm, rate 63, axis within normal limits, intervals within normal limits, no acute ST-T wave changes. PVCs.   01/21/2022    Wt Readings from Last 3 Encounters:  01/21/22 181 lb 6.4 oz (82.3 kg)   11/23/20 181 lb (82.1 kg)  11/09/20 181 lb (82.1 kg)    No results found for: "CHOL", "TRIG", "HDL", "LDLCALC", "LDLDIRECT"   Other studies Reviewed: Additional studies/ records that were reviewed today include: Labs Review of the above records demonstrates: NA  ASSESSMENT AND PLAN:   AORTIC ATHEROSCLEROSIS:    We will continue with aggressive risk reduction.  The goal LDL is less than 100.  His was 90.  No change in therapy.   HTN:   His blood pressure is controlled.  No change in therapy.  SOB:   Given  this and his risk factors with known atherosclerosis I like to screen him with a  POET (Plain Old Exercise Treadmill).  The pretest probability of obstructive coronary disease is somewhat low so treadmill testing is reasonable.   current medicines are reviewed at length with the patient today.  The patient does not have concerns regarding medicines.  The following changes have been made: None  Labs/ tests ordered today include:      Orders Placed This Encounter  Procedures   EXERCISE TOLERANCE TEST (ETT)   EKG 12-Lead    Disposition:   FU with me in 6 months or sooner if he has increasing symptoms   Signed, Minus Breeding, MD  01/21/2022 10:37 AM    [place]

## 2022-01-21 ENCOUNTER — Encounter: Payer: Self-pay | Admitting: Cardiology

## 2022-01-21 ENCOUNTER — Ambulatory Visit: Payer: PPO | Attending: Cardiology | Admitting: Cardiology

## 2022-01-21 VITALS — BP 128/72 | HR 63 | Ht 67.5 in | Wt 181.4 lb

## 2022-01-21 DIAGNOSIS — R0602 Shortness of breath: Secondary | ICD-10-CM | POA: Diagnosis not present

## 2022-01-21 DIAGNOSIS — I10 Essential (primary) hypertension: Secondary | ICD-10-CM

## 2022-01-21 DIAGNOSIS — I700 Atherosclerosis of aorta: Secondary | ICD-10-CM

## 2022-01-21 MED ORDER — ASPIRIN 81 MG PO TBEC: 81.0000 mg | DELAYED_RELEASE_TABLET | Freq: Every day | ORAL | 3 refills | Status: AC

## 2022-01-21 NOTE — Patient Instructions (Signed)
Medication Instructions:  The current medical regimen is effective;  continue present plan and medications.  *If you need a refill on your cardiac medications before your next appointment, please call your pharmacy*   Testing/Procedures: Your physician has requested that you have an exercise tolerance test, this is a screening tool to track your fitness level. This test evaluates the your exercise capacity by measuring cardiovascular response to exercise, the stress response is induced by exercise (exercise-treadmill).  Graded exercise test is also known as maximal exercise test or stress EKG test  . Please also follow instruction sheet given.   Follow-Up: At [facility], you and your health needs are our priority.  As part of our continuing mission to provide you with exceptional heart care, we have created designated Provider Care Teams.  These Care Teams include your primary Cardiologist (physician) and Advanced Practice Providers (APPs -  Physician Assistants and Nurse Practitioners) who all work together to provide you with the care you need, when you need it.  We recommend signing up for the patient portal called "MyChart".  Sign up information is provided on this After Visit Summary.  MyChart is used to connect with patients for Virtual Visits (Telemedicine).  Patients are able to view lab/test results, encounter notes, upcoming appointments, etc.  Non-urgent messages can be sent to your provider as well.   To learn more about what you can do with MyChart, go to [email or web address].    Your next appointment:   6 month(s)  The format for your next appointment:   In Person  Provider:   Minus Breeding, MD

## 2022-01-24 ENCOUNTER — Telehealth: Payer: Self-pay | Admitting: Cardiology

## 2022-01-24 DIAGNOSIS — D3131 Benign neoplasm of right choroid: Secondary | ICD-10-CM | POA: Diagnosis not present

## 2022-01-24 DIAGNOSIS — H524 Presbyopia: Secondary | ICD-10-CM | POA: Diagnosis not present

## 2022-01-24 NOTE — Telephone Encounter (Signed)
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 The ophthalmologist would like patient to have an echo and carotid studies. Please advise.

## 2022-01-24 NOTE — Telephone Encounter (Signed)
[facility] Ophthalmologist is calling about a patient that has a vascular plaque in eye and doctor wants to talk with triage about scheduling test for patient

## 2022-01-25 NOTE — Telephone Encounter (Signed)
Spoke with [facility] ophthalmology to fax 10/2 visit note again to our clinic.

## 2022-01-25 NOTE — Telephone Encounter (Signed)
Fax received from [facility] ophthalmology and placed in Dr. Rosezella [place] mailbox.

## 2022-01-27 ENCOUNTER — Other Ambulatory Visit: Payer: Self-pay | Admitting: *Deleted

## 2022-01-27 ENCOUNTER — Telehealth: Payer: Self-pay | Admitting: *Deleted

## 2022-01-27 DIAGNOSIS — C44219 Basal cell carcinoma of skin of left ear and external auricular canal: Secondary | ICD-10-CM | POA: Diagnosis not present

## 2022-01-27 DIAGNOSIS — H34211 Partial retinal artery occlusion, right eye: Secondary | ICD-10-CM

## 2022-01-27 NOTE — Telephone Encounter (Signed)
Spoke with heather, ICD-10 H34.211 used for diagnosis for testing. Order placed

## 2022-01-27 NOTE — Telephone Encounter (Signed)
Left message for heather to call, they are asking for the patient to have an echo and carotid for vascular plaque. Per dr hochrein we need to figure out a diagnosis that insurance will cover for that testing.

## 2022-01-27 NOTE — Telephone Encounter (Signed)
Heather returned RN's call.

## 2022-01-31 ENCOUNTER — Ambulatory Visit (HOSPITAL_COMMUNITY)
Admission: RE | Admit: 2022-01-31 | Discharge: 2022-01-31 | Disposition: A | Discharge status: Home / Self Care | Payer: PPO | Source: Ambulatory Visit | Attending: Cardiology | Admitting: Cardiology

## 2022-01-31 DIAGNOSIS — H34211 Partial retinal artery occlusion, right eye: Secondary | ICD-10-CM | POA: Diagnosis not present

## 2022-01-31 DIAGNOSIS — I10 Essential (primary) hypertension: Secondary | ICD-10-CM | POA: Diagnosis not present

## 2022-01-31 LAB — ECHOCARDIOGRAM COMPLETE
AR max vel: 1.62 cm2
AV Area VTI: 1.92 cm2
AV Area mean vel: 1.35 cm2
AV Mean grad: 4 mmHg
AV Peak grad: 9.5 mmHg
Ao pk vel: 1.54 m/s
Area-P 1/2: 2.91 cm2
Calc EF: 59.6 %
MV M vel: 2.88 m/s
MV Peak grad: 33.3 mmHg
S' Lateral: 3 cm
Single Plane A2C EF: 64.6 %
Single Plane A4C EF: 57 %

## 2022-01-31 NOTE — Progress Notes (Signed)
  Echocardiogram 2D Echocardiogram has been performed.  Ross [name] 01/31/2022, 3:00 PM

## 2022-02-08 ENCOUNTER — Encounter: Payer: Self-pay | Admitting: *Deleted

## 2022-02-09 ENCOUNTER — Ambulatory Visit (HOSPITAL_COMMUNITY)
Admission: RE | Admit: 2022-02-09 | Discharge: 2022-02-09 | Disposition: A | Discharge status: Home / Self Care | Payer: PPO | Source: Ambulatory Visit | Attending: Cardiology | Admitting: Cardiology

## 2022-02-09 DIAGNOSIS — H34211 Partial retinal artery occlusion, right eye: Secondary | ICD-10-CM

## 2022-02-14 DIAGNOSIS — J3089 Other allergic rhinitis: Secondary | ICD-10-CM | POA: Diagnosis not present

## 2022-02-14 DIAGNOSIS — J3081 Allergic rhinitis due to animal (cat) (dog) hair and dander: Secondary | ICD-10-CM | POA: Diagnosis not present

## 2022-02-14 DIAGNOSIS — J301 Allergic rhinitis due to pollen: Secondary | ICD-10-CM | POA: Diagnosis not present

## 2022-02-17 ENCOUNTER — Encounter (HOSPITAL_COMMUNITY): Payer: PPO

## 2022-02-17 DIAGNOSIS — C44219 Basal cell carcinoma of skin of left ear and external auricular canal: Secondary | ICD-10-CM | POA: Diagnosis not present

## 2022-02-21 ENCOUNTER — Other Ambulatory Visit: Payer: Self-pay | Admitting: Cardiology

## 2022-02-21 ENCOUNTER — Encounter: Payer: Self-pay | Admitting: *Deleted

## 2022-02-24 ENCOUNTER — Telehealth: Payer: Self-pay | Admitting: Cardiology

## 2022-02-24 NOTE — Telephone Encounter (Signed)
[facility] Ophthalmology is requesting a fax for recent test done on patient. Fax # [phone]

## 2022-02-24 NOTE — Telephone Encounter (Signed)
Spoke to [employer] - carotid and echo report  routed to [facility]hthal[facility] . She verbalized understanding

## 2022-03-14 DIAGNOSIS — J3089 Other allergic rhinitis: Secondary | ICD-10-CM | POA: Diagnosis not present

## 2022-03-14 DIAGNOSIS — J301 Allergic rhinitis due to pollen: Secondary | ICD-10-CM | POA: Diagnosis not present

## 2022-03-14 DIAGNOSIS — J3081 Allergic rhinitis due to animal (cat) (dog) hair and dander: Secondary | ICD-10-CM | POA: Diagnosis not present

## 2022-03-30 ENCOUNTER — Other Ambulatory Visit: Payer: Self-pay | Admitting: Cardiology

## 2022-03-30 DIAGNOSIS — J069 Acute upper respiratory infection, unspecified: Secondary | ICD-10-CM | POA: Diagnosis not present

## 2022-03-30 DIAGNOSIS — R0981 Nasal congestion: Secondary | ICD-10-CM | POA: Diagnosis not present

## 2022-03-30 DIAGNOSIS — R059 Cough, unspecified: Secondary | ICD-10-CM | POA: Diagnosis not present

## 2022-04-01 ENCOUNTER — Encounter (HOSPITAL_COMMUNITY): Payer: PPO

## 2022-04-01 DIAGNOSIS — H1045 Other chronic allergic conjunctivitis: Secondary | ICD-10-CM | POA: Diagnosis not present

## 2022-04-01 DIAGNOSIS — J3089 Other allergic rhinitis: Secondary | ICD-10-CM | POA: Diagnosis not present

## 2022-04-01 DIAGNOSIS — R052 Subacute cough: Secondary | ICD-10-CM | POA: Diagnosis not present

## 2022-04-01 DIAGNOSIS — J3081 Allergic rhinitis due to animal (cat) (dog) hair and dander: Secondary | ICD-10-CM | POA: Diagnosis not present

## 2022-04-01 DIAGNOSIS — J301 Allergic rhinitis due to pollen: Secondary | ICD-10-CM | POA: Diagnosis not present

## 2022-04-01 DIAGNOSIS — J019 Acute sinusitis, unspecified: Secondary | ICD-10-CM | POA: Diagnosis not present

## 2022-04-07 ENCOUNTER — Telehealth (HOSPITAL_COMMUNITY): Payer: Self-pay | Admitting: *Deleted

## 2022-04-07 NOTE — Telephone Encounter (Signed)
Reached pt and gave instructions for ETT scheduled on 04/14/22.

## 2022-04-11 DIAGNOSIS — J301 Allergic rhinitis due to pollen: Secondary | ICD-10-CM | POA: Diagnosis not present

## 2022-04-11 DIAGNOSIS — J3089 Other allergic rhinitis: Secondary | ICD-10-CM | POA: Diagnosis not present

## 2022-04-14 ENCOUNTER — Ambulatory Visit (HOSPITAL_COMMUNITY): Payer: PPO | Attending: Cardiology

## 2022-04-14 DIAGNOSIS — R0602 Shortness of breath: Secondary | ICD-10-CM | POA: Diagnosis not present

## 2022-04-14 LAB — EXERCISE TOLERANCE TEST
Estimated workload: 7
Exercise duration (min): 6 min
Exercise duration (sec): 0 s
MPHR: 141 {beats}/min
Peak HR: 134 {beats}/min
Percent HR: 95 %
Rest HR: 86 {beats}/min

## 2022-04-26 ENCOUNTER — Encounter: Payer: Self-pay | Admitting: *Deleted

## 2022-05-05 ENCOUNTER — Other Ambulatory Visit: Payer: Self-pay | Admitting: Cardiology

## 2022-05-06 NOTE — Telephone Encounter (Signed)
*  STAT* If patient is at the pharmacy, call can be transferred to refill team.   1. Which medications need to be refilled? (please list name of each medication and dose if known) ezetimibe (ZETIA) 10 MG tablet   2. Which pharmacy/location (including street and city if local pharmacy) is medication to be sent to? [facility] # [place], [place]   3. Do they need a 30 day or 90 day supply?  90 day with refills (would like an annual supply)

## 2022-05-09 ENCOUNTER — Telehealth: Payer: Self-pay | Admitting: Cardiology

## 2022-05-09 DIAGNOSIS — J301 Allergic rhinitis due to pollen: Secondary | ICD-10-CM | POA: Diagnosis not present

## 2022-05-09 DIAGNOSIS — J3089 Other allergic rhinitis: Secondary | ICD-10-CM | POA: Diagnosis not present

## 2022-05-09 DIAGNOSIS — J3081 Allergic rhinitis due to animal (cat) (dog) hair and dander: Secondary | ICD-10-CM | POA: Diagnosis not present

## 2022-05-09 NOTE — Telephone Encounter (Signed)
  Pt c/o medication issue:  1. Name of Medication: ezetimibe (ZETIA) 10 MG tablet   2. How are you currently taking this medication (dosage and times per day)?   Take 1 tablet (10 mg total) by mouth daily. Please schedule an appointment for future refills    3. Are you having a reaction (difficulty breathing--STAT)? No   4. What is your medication issue? Pt picked up his medication and he was told to get future refills. He said, he doesn't think he needs to see him again since all his tests is normal, especially since he just saw him in September

## 2022-05-09 NOTE — Telephone Encounter (Signed)
Attempted to contact patient, LOV was 6 months ago, patient is coming due for an appointment in March.   Will let patient know we can get him scheduled for this visit.

## 2022-05-11 MED ORDER — EZETIMIBE 10 MG PO TABS: 10.0000 mg | ORAL_TABLET | Freq: Every day | ORAL | 0 refills | Status: DC

## 2022-05-11 NOTE — Telephone Encounter (Signed)
Spoke with pt, he is needing refills on ezetimibe and aware he would need an appointment. He really does not think he needs to come back and patient was told he did not have to come back but that he needed to get all his refills through his medical doctor. He said he will come to an appointment, Follow up scheduled.

## 2022-05-27 DIAGNOSIS — M65351 Trigger finger, right little finger: Secondary | ICD-10-CM | POA: Diagnosis not present

## 2022-06-06 DIAGNOSIS — J3081 Allergic rhinitis due to animal (cat) (dog) hair and dander: Secondary | ICD-10-CM | POA: Diagnosis not present

## 2022-06-06 DIAGNOSIS — J3089 Other allergic rhinitis: Secondary | ICD-10-CM | POA: Diagnosis not present

## 2022-06-06 DIAGNOSIS — J301 Allergic rhinitis due to pollen: Secondary | ICD-10-CM | POA: Diagnosis not present

## 2022-06-09 DIAGNOSIS — N401 Enlarged prostate with lower urinary tract symptoms: Secondary | ICD-10-CM | POA: Diagnosis not present

## 2022-06-09 DIAGNOSIS — R351 Nocturia: Secondary | ICD-10-CM | POA: Diagnosis not present

## 2022-06-09 DIAGNOSIS — N200 Calculus of kidney: Secondary | ICD-10-CM | POA: Diagnosis not present

## 2022-06-10 DIAGNOSIS — M7062 Trochanteric bursitis, left hip: Secondary | ICD-10-CM | POA: Diagnosis not present

## 2022-07-06 NOTE — Progress Notes (Unsigned)
Cardiology Office Note   Date:  07/07/2022   ID:  Ross Level., DOB October 14, 1940, MRN [id number]  PCP:  Ginger Organ., MD  Cardiologist:   Minus Breeding, MD    Chief Complaint  Patient presents with   Shortness of Breath     History of Present Illness: Ross Gargan. is a 82 y.o. male who presents for evaluation of aortic calcification. He's being evaluated for kidney stone and had a CT. He had some calcium in his aorta down into his iliacs.  Echo in 2012 demonstrated normal left ventricular function with some mild aortic valve calcification. He's had minimal plaque on carotid Dopplers. Stress perfusion study in 2008 demonstrated no evidence of ischemia. POET (Plain Old Exercise Treadmill) in 2016 had some nonspecific ST changes but no clear evidence of ischemia.  He returns for follow up.  At the last visit he had increased dyspnea.  POET (Plain Old Exercise Treadmill) was negative.    He has done well since I last saw him.  He had some illness after he went pheasant hunting in [place] late last year but he recovered from this.  He walks and goes to the gym. The patient denies any new symptoms such as chest discomfort, neck or arm discomfort. There has been no new shortness of breath, PND or orthopnea. There have been no reported palpitations, presyncope or syncope.   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   Essential and other specified forms of tremor 08/30/2013   History of kidney stones    Hypertension    Kidney stones    Stroke [facility]) 2005    Past Surgical History:  Procedure Laterality Date   APPENDECTOMY     CATARACT EXTRACTION     bilateral   CHOLECYSTECTOMY     CYSTOSCOPY WITH RETROGRADE PYELOGRAM, URETEROSCOPY AND STENT PLACEMENT Left 06/11/2013   Procedure: CYSTOSCOPY WITH RETROGRADE Left PYELOGRAM, URETEROSCOPY AND Left STENT PLACEMENT;  Surgeon: Ailene Rud, MD;  Location: [facility];  Service:  Urology;  Laterality: Left;   EXTRACORPOREAL SHOCK WAVE LITHOTRIPSY Left 07/06/2020   Procedure: EXTRACORPOREAL SHOCK WAVE LITHOTRIPSY (ESWL);  Surgeon: Ardis Hughs, MD;  Location[facility];  Service: Urology;  Laterality: Left;   KNEE SURGERY     bilateral arthroscopic   LUMBAR LAMINECTOMY/DECOMPRESSION MICRODISCECTOMY N/A 11/11/2019   Procedure: Lumbar four-five Laminectomy for facet/synovial cyst;  Surgeon: Kary Kos, MD;  Location: [facility];  Service: Neurosurgery;  Laterality: N/A;   SHOULDER SURGERY     right    SYNOVECTOMY WITH POLY EXCHANGE Right 11/23/2020   Procedure: SYNOVECTOMY WITH POLY EXCHANGE;  Surgeon: Vickey Huger, MD;  Location: [facility];  Service: Orthopedics;  Laterality: Right;   TOTAL KNEE ARTHROPLASTY Right 12/06/2016   Procedure: RIGHT TOTAL KNEE ARTHROPLASTY;  Surgeon: Paralee Cancel, MD;  Location: [facility];  Service: Orthopedics;  Laterality: Right;  70 mins   TRIGGER FINGER RELEASE Bilateral      Current Outpatient Medications  Medication Sig Dispense Refill   aspirin EC 81 MG tablet Take 1 tablet (81 mg total) by mouth daily. 90 tablet 3   b complex vitamins tablet Take 1 tablet by mouth daily.      cholecalciferol (VITAMIN D3) 25 MCG (1000 UNIT) tablet Take 1,000 Units by mouth daily.     EPINEPHrine 0.3 mg/0.3 mL IJ SOAJ injection Inject 0.3 mg into the muscle as  needed for anaphylaxis.      fluticasone (FLONASE) 50 MCG/ACT nasal spray Place 1 spray into both nostrils daily.     ketoconazole (NIZORAL) 2 % cream Apply 1 application topically daily as needed for irritation.     Multiple Vitamin (MULTIVITAMIN) tablet Take 1 tablet by mouth daily.      Multiple Vitamins-Minerals (PRESERVISION AREDS 2 PO) Take 1 capsule by mouth daily.     PRESCRIPTION MEDICATION Inject 1 each into the skin every 30 (thirty) days. Allergy shot     Propylene Glycol (SYSTANE COMPLETE) 0.6 % SOLN Place 1 drop into both eyes in the morning, at noon, in the evening,  and at bedtime.     psyllium (METAMUCIL) 58.6 % packet Take 0.5 packets by mouth daily.     tamsulosin (FLOMAX) 0.4 MG CAPS capsule Take 0.4 mg by mouth 2 (two) times daily.     CINNAMON PO Take 2 tablets by mouth daily. (Patient not taking: Reported on 07/07/2022)     ezetimibe (ZETIA) 10 MG tablet Take 1 tablet (10 mg total) by mouth daily. 90 tablet 3   lisinopril-hydrochlorothiazide (ZESTORETIC) 20-12.5 MG tablet Take 1 tablet by mouth daily. 90 tablet 3   Potassium Citrate 15 MEQ (1620 MG) TBCR Take 2 tablets by mouth in the morning and at bedtime. 360 tablet 3   No current facility-administered medications for this visit.    Allergies:   Dexlansoprazole    ROS:  Please see the history of present illness.   Otherwise, review of systems are positive for none.   All other systems are reviewed and negative.    PHYSICAL EXAM: VS:  BP (!) 142/74   Pulse (!) 51   Ht '5\' 7"'$  (1.702 m)   Wt 184 lb 12.8 oz (83.8 kg)   SpO2 94%   BMI 28.94 kg/m  , BMI Body mass index is 28.94 kg/m.  GENERAL:  Well appearing NECK:  No jugular venous distention, waveform within normal limits, carotid upstroke brisk and symmetric, no bruits, no thyromegaly LUNGS:  Clear to auscultation bilaterally CHEST:  Unremarkable HEART:  PMI not displaced or sustained,S1 and S2 within normal limits, no S3, no S4, no clicks, no rubs, no murmurs ABD:  Flat, positive bowel sounds normal in frequency in pitch, no bruits, no rebound, no guarding, no midline pulsatile mass, no hepatomegaly, no splenomegaly EXT:  2 plus pulses throughout, no edema, no cyanosis no clubbing   EKG:  EKG is  ordered today. The ekg ordered today demonstrates sinus rhythm, rate movement 51, axis within normal limits, intervals within normal limits, no acute ST-T wave changes. PVCs.   07/07/2022    Wt Readings from Last 3 Encounters:  07/07/22 184 lb 12.8 oz (83.8 kg)  01/21/22 181 lb 6.4 oz (82.3 kg)  11/23/20 181 lb (82.1 kg)    No results  found for: "CHOL", "TRIG", "HDL", "LDLCALC", "LDLDIRECT"   Other studies Reviewed: Additional studies/ records that were reviewed today include:  Labs Review of the above records demonstrates:See elsewhere  ASSESSMENT AND PLAN:   AORTIC ATHEROSCLEROSIS:   He is participating in primary risk reduction.  His LDL was down to 90.  I would continue the meds as listed.  We talked about the Mediterranean diet.  Otherwise no change in therapy.   HTN:   His blood pressure is well-controlled.  No change in therapy.   SOB:   He had a negative POET (Plain Old Exercise Treadmill).  He has no further shortness of  breath.  No further testing is indicated.   Current medicines are reviewed at length with the patient today.  The patient does not have concerns regarding medicines.  The following changes have been made: None  Labs/ tests ordered today include:    None  Orders Placed This Encounter  Procedures   EKG 12-Lead    Disposition:   FU with me as needed.    Signed, Minus Breeding, MD  07/07/2022 12:43 PM    [place]

## 2022-07-07 ENCOUNTER — Encounter: Payer: Self-pay | Admitting: Cardiology

## 2022-07-07 ENCOUNTER — Ambulatory Visit: Payer: PPO | Attending: Cardiology | Admitting: Cardiology

## 2022-07-07 VITALS — BP 142/74 | HR 51 | Ht 67.0 in | Wt 184.8 lb

## 2022-07-07 DIAGNOSIS — I10 Essential (primary) hypertension: Secondary | ICD-10-CM

## 2022-07-07 MED ORDER — LISINOPRIL-HYDROCHLOROTHIAZIDE 20-12.5 MG PO TABS: 1.0000 | ORAL_TABLET | Freq: Every day | ORAL | 3 refills | Status: DC

## 2022-07-07 MED ORDER — POTASSIUM CITRATE ER 15 MEQ (1620 MG) PO TBCR: 2.0000 | EXTENDED_RELEASE_TABLET | Freq: Two times a day (BID) | ORAL | 3 refills | Status: AC

## 2022-07-07 MED ORDER — EZETIMIBE 10 MG PO TABS: 10.0000 mg | ORAL_TABLET | Freq: Every day | ORAL | 3 refills | Status: AC

## 2022-07-07 NOTE — Patient Instructions (Signed)
Medication Instructions:   Your physician recommends that you continue on your current medications as directed. Please refer to the Current Medication list given to you today.  *If you need a refill on your cardiac medications before your next appointment, please call your pharmacy*  Lab Work: NONE ordered at this time of appointment   If you have labs (blood work) drawn today and your tests are completely normal, you will receive your results only by: [place] (if you have MyChart) OR A paper copy in the mail If you have any lab test that is abnormal or we need to change your treatment, we will call you to review the results.  Testing/Procedures: NONE ordered at this time of appointment   Follow-Up: At [facility], you and your health needs are our priority.  As part of our continuing mission to provide you with exceptional heart care, we have created designated Provider Care Teams.  These Care Teams include your primary Cardiologist (physician) and Advanced Practice Providers (APPs -  Physician Assistants and Nurse Practitioners) who all work together to provide you with the care you need, when you need it.  We recommend signing up for the patient portal called "MyChart".  Sign up information is provided on this After Visit Summary.  MyChart is used to connect with patients for Virtual Visits (Telemedicine).  Patients are able to view lab/test results, encounter notes, upcoming appointments, etc.  Non-urgent messages can be sent to your provider as well.   To learn more about what you can do with MyChart, go to [email or web address].    Your next appointment:   As needed if symptoms worsen or fail to improve    Provider:   Minus Breeding, MD     Other Instructions

## 2022-07-08 DIAGNOSIS — M65351 Trigger finger, right little finger: Secondary | ICD-10-CM | POA: Diagnosis not present

## 2022-07-12 DIAGNOSIS — J3089 Other allergic rhinitis: Secondary | ICD-10-CM | POA: Diagnosis not present

## 2022-07-12 DIAGNOSIS — J3081 Allergic rhinitis due to animal (cat) (dog) hair and dander: Secondary | ICD-10-CM | POA: Diagnosis not present

## 2022-07-12 DIAGNOSIS — J301 Allergic rhinitis due to pollen: Secondary | ICD-10-CM | POA: Diagnosis not present

## 2022-07-21 DIAGNOSIS — J301 Allergic rhinitis due to pollen: Secondary | ICD-10-CM | POA: Diagnosis not present

## 2022-07-21 DIAGNOSIS — J3089 Other allergic rhinitis: Secondary | ICD-10-CM | POA: Diagnosis not present

## 2022-07-25 DIAGNOSIS — J301 Allergic rhinitis due to pollen: Secondary | ICD-10-CM | POA: Diagnosis not present

## 2022-07-25 DIAGNOSIS — J3081 Allergic rhinitis due to animal (cat) (dog) hair and dander: Secondary | ICD-10-CM | POA: Diagnosis not present

## 2022-07-25 DIAGNOSIS — J3089 Other allergic rhinitis: Secondary | ICD-10-CM | POA: Diagnosis not present

## 2022-08-01 DIAGNOSIS — J3081 Allergic rhinitis due to animal (cat) (dog) hair and dander: Secondary | ICD-10-CM | POA: Diagnosis not present

## 2022-08-01 DIAGNOSIS — J3089 Other allergic rhinitis: Secondary | ICD-10-CM | POA: Diagnosis not present

## 2022-08-01 DIAGNOSIS — J301 Allergic rhinitis due to pollen: Secondary | ICD-10-CM | POA: Diagnosis not present

## 2022-08-08 DIAGNOSIS — J3089 Other allergic rhinitis: Secondary | ICD-10-CM | POA: Diagnosis not present

## 2022-08-08 DIAGNOSIS — J3081 Allergic rhinitis due to animal (cat) (dog) hair and dander: Secondary | ICD-10-CM | POA: Diagnosis not present

## 2022-08-08 DIAGNOSIS — J301 Allergic rhinitis due to pollen: Secondary | ICD-10-CM | POA: Diagnosis not present

## 2022-08-16 DIAGNOSIS — L578 Other skin changes due to chronic exposure to nonionizing radiation: Secondary | ICD-10-CM | POA: Diagnosis not present

## 2022-08-16 DIAGNOSIS — D485 Neoplasm of uncertain behavior of skin: Secondary | ICD-10-CM | POA: Diagnosis not present

## 2022-08-16 DIAGNOSIS — L219 Seborrheic dermatitis, unspecified: Secondary | ICD-10-CM | POA: Diagnosis not present

## 2022-08-16 DIAGNOSIS — Z85828 Personal history of other malignant neoplasm of skin: Secondary | ICD-10-CM | POA: Diagnosis not present

## 2022-08-16 DIAGNOSIS — L570 Actinic keratosis: Secondary | ICD-10-CM | POA: Diagnosis not present

## 2022-08-16 DIAGNOSIS — L821 Other seborrheic keratosis: Secondary | ICD-10-CM | POA: Diagnosis not present

## 2022-08-16 DIAGNOSIS — D225 Melanocytic nevi of trunk: Secondary | ICD-10-CM | POA: Diagnosis not present

## 2022-08-16 DIAGNOSIS — Z86018 Personal history of other benign neoplasm: Secondary | ICD-10-CM | POA: Diagnosis not present

## 2022-08-16 DIAGNOSIS — C44729 Squamous cell carcinoma of skin of left lower limb, including hip: Secondary | ICD-10-CM | POA: Diagnosis not present

## 2022-08-16 DIAGNOSIS — D2271 Melanocytic nevi of right lower limb, including hip: Secondary | ICD-10-CM | POA: Diagnosis not present

## 2022-09-05 DIAGNOSIS — J3089 Other allergic rhinitis: Secondary | ICD-10-CM | POA: Diagnosis not present

## 2022-09-05 DIAGNOSIS — J3081 Allergic rhinitis due to animal (cat) (dog) hair and dander: Secondary | ICD-10-CM | POA: Diagnosis not present

## 2022-09-05 DIAGNOSIS — J301 Allergic rhinitis due to pollen: Secondary | ICD-10-CM | POA: Diagnosis not present

## 2022-09-12 DIAGNOSIS — H02135 Senile ectropion of left lower eyelid: Secondary | ICD-10-CM | POA: Diagnosis not present

## 2022-09-12 DIAGNOSIS — H04123 Dry eye syndrome of bilateral lacrimal glands: Secondary | ICD-10-CM | POA: Diagnosis not present

## 2022-09-12 DIAGNOSIS — H04552 Acquired stenosis of left nasolacrimal duct: Secondary | ICD-10-CM | POA: Diagnosis not present

## 2022-09-12 DIAGNOSIS — H04223 Epiphora due to insufficient drainage, bilateral lacrimal glands: Secondary | ICD-10-CM | POA: Diagnosis not present

## 2022-09-12 DIAGNOSIS — H04551 Acquired stenosis of right nasolacrimal duct: Secondary | ICD-10-CM | POA: Diagnosis not present

## 2022-09-12 DIAGNOSIS — H0279 Other degenerative disorders of eyelid and periocular area: Secondary | ICD-10-CM | POA: Diagnosis not present

## 2022-09-12 DIAGNOSIS — Z01818 Encounter for other preprocedural examination: Secondary | ICD-10-CM | POA: Diagnosis not present

## 2022-09-12 DIAGNOSIS — H16213 Exposure keratoconjunctivitis, bilateral: Secondary | ICD-10-CM | POA: Diagnosis not present

## 2022-09-12 DIAGNOSIS — H02132 Senile ectropion of right lower eyelid: Secondary | ICD-10-CM | POA: Diagnosis not present

## 2022-09-12 DIAGNOSIS — J342 Deviated nasal septum: Secondary | ICD-10-CM | POA: Diagnosis not present

## 2022-09-12 DIAGNOSIS — H04553 Acquired stenosis of bilateral nasolacrimal duct: Secondary | ICD-10-CM | POA: Diagnosis not present

## 2022-09-28 DIAGNOSIS — C44729 Squamous cell carcinoma of skin of left lower limb, including hip: Secondary | ICD-10-CM | POA: Diagnosis not present

## 2022-10-03 DIAGNOSIS — J3081 Allergic rhinitis due to animal (cat) (dog) hair and dander: Secondary | ICD-10-CM | POA: Diagnosis not present

## 2022-10-03 DIAGNOSIS — J301 Allergic rhinitis due to pollen: Secondary | ICD-10-CM | POA: Diagnosis not present

## 2022-10-03 DIAGNOSIS — J3089 Other allergic rhinitis: Secondary | ICD-10-CM | POA: Diagnosis not present

## 2022-10-10 DIAGNOSIS — J301 Allergic rhinitis due to pollen: Secondary | ICD-10-CM | POA: Diagnosis not present

## 2022-10-10 DIAGNOSIS — J3081 Allergic rhinitis due to animal (cat) (dog) hair and dander: Secondary | ICD-10-CM | POA: Diagnosis not present

## 2022-10-10 DIAGNOSIS — J3089 Other allergic rhinitis: Secondary | ICD-10-CM | POA: Diagnosis not present

## 2022-10-17 DIAGNOSIS — H04553 Acquired stenosis of bilateral nasolacrimal duct: Secondary | ICD-10-CM | POA: Diagnosis not present

## 2022-10-17 DIAGNOSIS — J301 Allergic rhinitis due to pollen: Secondary | ICD-10-CM | POA: Diagnosis not present

## 2022-10-17 DIAGNOSIS — H04221 Epiphora due to insufficient drainage, right lacrimal gland: Secondary | ICD-10-CM | POA: Diagnosis not present

## 2022-10-17 DIAGNOSIS — J342 Deviated nasal septum: Secondary | ICD-10-CM | POA: Diagnosis not present

## 2022-10-17 DIAGNOSIS — J3089 Other allergic rhinitis: Secondary | ICD-10-CM | POA: Diagnosis not present

## 2022-10-17 DIAGNOSIS — H02132 Senile ectropion of right lower eyelid: Secondary | ICD-10-CM | POA: Diagnosis not present

## 2022-10-17 DIAGNOSIS — H04223 Epiphora due to insufficient drainage, bilateral lacrimal glands: Secondary | ICD-10-CM | POA: Diagnosis not present

## 2022-10-17 DIAGNOSIS — H0279 Other degenerative disorders of eyelid and periocular area: Secondary | ICD-10-CM | POA: Diagnosis not present

## 2022-10-17 DIAGNOSIS — H02135 Senile ectropion of left lower eyelid: Secondary | ICD-10-CM | POA: Diagnosis not present

## 2022-10-17 DIAGNOSIS — H04551 Acquired stenosis of right nasolacrimal duct: Secondary | ICD-10-CM | POA: Diagnosis not present

## 2022-10-17 DIAGNOSIS — H04222 Epiphora due to insufficient drainage, left lacrimal gland: Secondary | ICD-10-CM | POA: Diagnosis not present

## 2022-10-17 DIAGNOSIS — J3081 Allergic rhinitis due to animal (cat) (dog) hair and dander: Secondary | ICD-10-CM | POA: Diagnosis not present

## 2022-10-17 DIAGNOSIS — H04022 Chronic dacryoadenitis, left lacrimal gland: Secondary | ICD-10-CM | POA: Diagnosis not present

## 2022-10-17 DIAGNOSIS — H04512 Dacryolith of left lacrimal passage: Secondary | ICD-10-CM | POA: Diagnosis not present

## 2022-10-17 DIAGNOSIS — H04123 Dry eye syndrome of bilateral lacrimal glands: Secondary | ICD-10-CM | POA: Diagnosis not present

## 2022-10-17 DIAGNOSIS — H16213 Exposure keratoconjunctivitis, bilateral: Secondary | ICD-10-CM | POA: Diagnosis not present

## 2022-10-17 DIAGNOSIS — H04212 Epiphora due to excess lacrimation, left lacrimal gland: Secondary | ICD-10-CM | POA: Diagnosis not present

## 2022-10-17 DIAGNOSIS — H04552 Acquired stenosis of left nasolacrimal duct: Secondary | ICD-10-CM | POA: Diagnosis not present

## 2022-11-10 DIAGNOSIS — H02132 Senile ectropion of right lower eyelid: Secondary | ICD-10-CM | POA: Diagnosis not present

## 2022-11-10 DIAGNOSIS — H04221 Epiphora due to insufficient drainage, right lacrimal gland: Secondary | ICD-10-CM | POA: Diagnosis not present

## 2022-11-10 DIAGNOSIS — H04552 Acquired stenosis of left nasolacrimal duct: Secondary | ICD-10-CM | POA: Diagnosis not present

## 2022-11-10 DIAGNOSIS — Z09 Encounter for follow-up examination after completed treatment for conditions other than malignant neoplasm: Secondary | ICD-10-CM | POA: Diagnosis not present

## 2022-11-10 DIAGNOSIS — H04222 Epiphora due to insufficient drainage, left lacrimal gland: Secondary | ICD-10-CM | POA: Diagnosis not present

## 2022-11-10 DIAGNOSIS — H02135 Senile ectropion of left lower eyelid: Secondary | ICD-10-CM | POA: Diagnosis not present

## 2022-11-10 DIAGNOSIS — H0279 Other degenerative disorders of eyelid and periocular area: Secondary | ICD-10-CM | POA: Diagnosis not present

## 2022-11-14 DIAGNOSIS — M25512 Pain in left shoulder: Secondary | ICD-10-CM | POA: Diagnosis not present

## 2022-11-14 DIAGNOSIS — M25511 Pain in right shoulder: Secondary | ICD-10-CM | POA: Diagnosis not present

## 2022-11-16 DIAGNOSIS — J3089 Other allergic rhinitis: Secondary | ICD-10-CM | POA: Diagnosis not present

## 2022-11-16 DIAGNOSIS — J301 Allergic rhinitis due to pollen: Secondary | ICD-10-CM | POA: Diagnosis not present

## 2022-11-16 DIAGNOSIS — J3081 Allergic rhinitis due to animal (cat) (dog) hair and dander: Secondary | ICD-10-CM | POA: Diagnosis not present

## 2022-11-30 DIAGNOSIS — I10 Essential (primary) hypertension: Secondary | ICD-10-CM | POA: Diagnosis not present

## 2022-11-30 DIAGNOSIS — N400 Enlarged prostate without lower urinary tract symptoms: Secondary | ICD-10-CM | POA: Diagnosis not present

## 2022-11-30 DIAGNOSIS — E785 Hyperlipidemia, unspecified: Secondary | ICD-10-CM | POA: Diagnosis not present

## 2022-11-30 DIAGNOSIS — R7301 Impaired fasting glucose: Secondary | ICD-10-CM | POA: Diagnosis not present

## 2022-12-07 DIAGNOSIS — M25511 Pain in right shoulder: Secondary | ICD-10-CM | POA: Diagnosis not present

## 2022-12-07 DIAGNOSIS — Z23 Encounter for immunization: Secondary | ICD-10-CM | POA: Diagnosis not present

## 2022-12-07 DIAGNOSIS — Z86010 Personal history of colonic polyps: Secondary | ICD-10-CM | POA: Diagnosis not present

## 2022-12-07 DIAGNOSIS — J3081 Allergic rhinitis due to animal (cat) (dog) hair and dander: Secondary | ICD-10-CM | POA: Diagnosis not present

## 2022-12-07 DIAGNOSIS — I10 Essential (primary) hypertension: Secondary | ICD-10-CM | POA: Diagnosis not present

## 2022-12-07 DIAGNOSIS — Z0000 Encounter for general adult medical examination without abnormal findings: Secondary | ICD-10-CM | POA: Diagnosis not present

## 2022-12-07 DIAGNOSIS — J3089 Other allergic rhinitis: Secondary | ICD-10-CM | POA: Diagnosis not present

## 2022-12-07 DIAGNOSIS — J301 Allergic rhinitis due to pollen: Secondary | ICD-10-CM | POA: Diagnosis not present

## 2022-12-07 DIAGNOSIS — Z1339 Encounter for screening examination for other mental health and behavioral disorders: Secondary | ICD-10-CM | POA: Diagnosis not present

## 2022-12-07 DIAGNOSIS — Z1331 Encounter for screening for depression: Secondary | ICD-10-CM | POA: Diagnosis not present

## 2022-12-07 DIAGNOSIS — R82998 Other abnormal findings in urine: Secondary | ICD-10-CM | POA: Diagnosis not present

## 2022-12-07 DIAGNOSIS — E785 Hyperlipidemia, unspecified: Secondary | ICD-10-CM | POA: Diagnosis not present

## 2022-12-07 DIAGNOSIS — G250 Essential tremor: Secondary | ICD-10-CM | POA: Diagnosis not present

## 2022-12-07 DIAGNOSIS — G729 Myopathy, unspecified: Secondary | ICD-10-CM | POA: Diagnosis not present

## 2022-12-07 DIAGNOSIS — N400 Enlarged prostate without lower urinary tract symptoms: Secondary | ICD-10-CM | POA: Diagnosis not present

## 2022-12-07 DIAGNOSIS — R7301 Impaired fasting glucose: Secondary | ICD-10-CM | POA: Diagnosis not present

## 2022-12-07 DIAGNOSIS — I700 Atherosclerosis of aorta: Secondary | ICD-10-CM | POA: Diagnosis not present

## 2022-12-12 DIAGNOSIS — H04552 Acquired stenosis of left nasolacrimal duct: Secondary | ICD-10-CM | POA: Diagnosis not present

## 2022-12-12 DIAGNOSIS — H04221 Epiphora due to insufficient drainage, right lacrimal gland: Secondary | ICD-10-CM | POA: Diagnosis not present

## 2022-12-12 DIAGNOSIS — H02132 Senile ectropion of right lower eyelid: Secondary | ICD-10-CM | POA: Diagnosis not present

## 2022-12-12 DIAGNOSIS — H04222 Epiphora due to insufficient drainage, left lacrimal gland: Secondary | ICD-10-CM | POA: Diagnosis not present

## 2022-12-12 DIAGNOSIS — H0279 Other degenerative disorders of eyelid and periocular area: Secondary | ICD-10-CM | POA: Diagnosis not present

## 2022-12-12 DIAGNOSIS — H02135 Senile ectropion of left lower eyelid: Secondary | ICD-10-CM | POA: Diagnosis not present

## 2022-12-28 DIAGNOSIS — M25512 Pain in left shoulder: Secondary | ICD-10-CM | POA: Diagnosis not present

## 2022-12-28 DIAGNOSIS — M25511 Pain in right shoulder: Secondary | ICD-10-CM | POA: Diagnosis not present

## 2023-01-03 DIAGNOSIS — M65351 Trigger finger, right little finger: Secondary | ICD-10-CM | POA: Diagnosis not present

## 2023-01-05 DIAGNOSIS — J3081 Allergic rhinitis due to animal (cat) (dog) hair and dander: Secondary | ICD-10-CM | POA: Diagnosis not present

## 2023-01-05 DIAGNOSIS — J3089 Other allergic rhinitis: Secondary | ICD-10-CM | POA: Diagnosis not present

## 2023-01-05 DIAGNOSIS — J301 Allergic rhinitis due to pollen: Secondary | ICD-10-CM | POA: Diagnosis not present

## 2023-01-17 DIAGNOSIS — G5601 Carpal tunnel syndrome, right upper limb: Secondary | ICD-10-CM | POA: Diagnosis not present

## 2023-01-17 DIAGNOSIS — M65351 Trigger finger, right little finger: Secondary | ICD-10-CM | POA: Diagnosis not present

## 2023-01-19 DIAGNOSIS — J3089 Other allergic rhinitis: Secondary | ICD-10-CM | POA: Diagnosis not present

## 2023-01-19 DIAGNOSIS — J019 Acute sinusitis, unspecified: Secondary | ICD-10-CM | POA: Diagnosis not present

## 2023-01-19 DIAGNOSIS — J3081 Allergic rhinitis due to animal (cat) (dog) hair and dander: Secondary | ICD-10-CM | POA: Diagnosis not present

## 2023-01-19 DIAGNOSIS — H1045 Other chronic allergic conjunctivitis: Secondary | ICD-10-CM | POA: Diagnosis not present

## 2023-01-19 DIAGNOSIS — R052 Subacute cough: Secondary | ICD-10-CM | POA: Diagnosis not present

## 2023-01-19 DIAGNOSIS — J301 Allergic rhinitis due to pollen: Secondary | ICD-10-CM | POA: Diagnosis not present

## 2023-01-20 DIAGNOSIS — M545 Low back pain, unspecified: Secondary | ICD-10-CM | POA: Diagnosis not present

## 2023-01-20 DIAGNOSIS — Z87442 Personal history of urinary calculi: Secondary | ICD-10-CM | POA: Diagnosis not present

## 2023-01-20 DIAGNOSIS — N390 Urinary tract infection, site not specified: Secondary | ICD-10-CM | POA: Diagnosis not present

## 2023-01-20 DIAGNOSIS — Z1389 Encounter for screening for other disorder: Secondary | ICD-10-CM | POA: Diagnosis not present

## 2023-01-20 DIAGNOSIS — I10 Essential (primary) hypertension: Secondary | ICD-10-CM | POA: Diagnosis not present

## 2023-01-20 DIAGNOSIS — K5790 Diverticulosis of intestine, part unspecified, without perforation or abscess without bleeding: Secondary | ICD-10-CM | POA: Diagnosis not present

## 2023-01-20 DIAGNOSIS — Z0000 Encounter for general adult medical examination without abnormal findings: Secondary | ICD-10-CM | POA: Diagnosis not present

## 2023-01-20 DIAGNOSIS — R112 Nausea with vomiting, unspecified: Secondary | ICD-10-CM | POA: Diagnosis not present

## 2023-02-02 DIAGNOSIS — J3089 Other allergic rhinitis: Secondary | ICD-10-CM | POA: Diagnosis not present

## 2023-02-02 DIAGNOSIS — J301 Allergic rhinitis due to pollen: Secondary | ICD-10-CM | POA: Diagnosis not present

## 2023-02-02 DIAGNOSIS — J3081 Allergic rhinitis due to animal (cat) (dog) hair and dander: Secondary | ICD-10-CM | POA: Diagnosis not present

## 2023-02-02 DIAGNOSIS — G5601 Carpal tunnel syndrome, right upper limb: Secondary | ICD-10-CM | POA: Diagnosis not present

## 2023-02-09 DIAGNOSIS — G5601 Carpal tunnel syndrome, right upper limb: Secondary | ICD-10-CM | POA: Diagnosis not present

## 2023-02-09 DIAGNOSIS — M65351 Trigger finger, right little finger: Secondary | ICD-10-CM | POA: Diagnosis not present

## 2023-02-14 DIAGNOSIS — I82890 Acute embolism and thrombosis of other specified veins: Secondary | ICD-10-CM | POA: Diagnosis not present

## 2023-02-14 DIAGNOSIS — R2231 Localized swelling, mass and lump, right upper limb: Secondary | ICD-10-CM | POA: Diagnosis not present

## 2023-02-17 DIAGNOSIS — D2271 Melanocytic nevi of right lower limb, including hip: Secondary | ICD-10-CM | POA: Diagnosis not present

## 2023-02-17 DIAGNOSIS — Z86018 Personal history of other benign neoplasm: Secondary | ICD-10-CM | POA: Diagnosis not present

## 2023-02-17 DIAGNOSIS — C44222 Squamous cell carcinoma of skin of right ear and external auricular canal: Secondary | ICD-10-CM | POA: Diagnosis not present

## 2023-02-17 DIAGNOSIS — L821 Other seborrheic keratosis: Secondary | ICD-10-CM | POA: Diagnosis not present

## 2023-02-17 DIAGNOSIS — Z85828 Personal history of other malignant neoplasm of skin: Secondary | ICD-10-CM | POA: Diagnosis not present

## 2023-02-17 DIAGNOSIS — D225 Melanocytic nevi of trunk: Secondary | ICD-10-CM | POA: Diagnosis not present

## 2023-02-17 DIAGNOSIS — L570 Actinic keratosis: Secondary | ICD-10-CM | POA: Diagnosis not present

## 2023-02-17 DIAGNOSIS — L578 Other skin changes due to chronic exposure to nonionizing radiation: Secondary | ICD-10-CM | POA: Diagnosis not present

## 2023-02-17 DIAGNOSIS — D485 Neoplasm of uncertain behavior of skin: Secondary | ICD-10-CM | POA: Diagnosis not present

## 2023-03-02 DIAGNOSIS — J3081 Allergic rhinitis due to animal (cat) (dog) hair and dander: Secondary | ICD-10-CM | POA: Diagnosis not present

## 2023-03-02 DIAGNOSIS — C44519 Basal cell carcinoma of skin of other part of trunk: Secondary | ICD-10-CM | POA: Diagnosis not present

## 2023-03-02 DIAGNOSIS — J301 Allergic rhinitis due to pollen: Secondary | ICD-10-CM | POA: Diagnosis not present

## 2023-03-02 DIAGNOSIS — J3089 Other allergic rhinitis: Secondary | ICD-10-CM | POA: Diagnosis not present

## 2023-03-06 DIAGNOSIS — H04552 Acquired stenosis of left nasolacrimal duct: Secondary | ICD-10-CM | POA: Diagnosis not present

## 2023-03-06 DIAGNOSIS — H0279 Other degenerative disorders of eyelid and periocular area: Secondary | ICD-10-CM | POA: Diagnosis not present

## 2023-03-06 DIAGNOSIS — H04222 Epiphora due to insufficient drainage, left lacrimal gland: Secondary | ICD-10-CM | POA: Diagnosis not present

## 2023-03-06 DIAGNOSIS — Z09 Encounter for follow-up examination after completed treatment for conditions other than malignant neoplasm: Secondary | ICD-10-CM | POA: Diagnosis not present

## 2023-03-06 DIAGNOSIS — H02132 Senile ectropion of right lower eyelid: Secondary | ICD-10-CM | POA: Diagnosis not present

## 2023-03-06 DIAGNOSIS — H04221 Epiphora due to insufficient drainage, right lacrimal gland: Secondary | ICD-10-CM | POA: Diagnosis not present

## 2023-03-06 DIAGNOSIS — H02135 Senile ectropion of left lower eyelid: Secondary | ICD-10-CM | POA: Diagnosis not present

## 2023-03-16 DIAGNOSIS — L089 Local infection of the skin and subcutaneous tissue, unspecified: Secondary | ICD-10-CM | POA: Diagnosis not present

## 2023-03-27 DIAGNOSIS — H524 Presbyopia: Secondary | ICD-10-CM | POA: Diagnosis not present

## 2023-03-27 DIAGNOSIS — Z961 Presence of intraocular lens: Secondary | ICD-10-CM | POA: Diagnosis not present

## 2023-03-27 DIAGNOSIS — D3131 Benign neoplasm of right choroid: Secondary | ICD-10-CM | POA: Diagnosis not present

## 2023-03-29 DIAGNOSIS — J3089 Other allergic rhinitis: Secondary | ICD-10-CM | POA: Diagnosis not present

## 2023-03-29 DIAGNOSIS — J301 Allergic rhinitis due to pollen: Secondary | ICD-10-CM | POA: Diagnosis not present

## 2023-03-29 DIAGNOSIS — J3081 Allergic rhinitis due to animal (cat) (dog) hair and dander: Secondary | ICD-10-CM | POA: Diagnosis not present

## 2023-04-06 DIAGNOSIS — C44229 Squamous cell carcinoma of skin of left ear and external auricular canal: Secondary | ICD-10-CM | POA: Diagnosis not present

## 2023-04-10 DIAGNOSIS — T8131XA Disruption of external operation (surgical) wound, not elsewhere classified, initial encounter: Secondary | ICD-10-CM | POA: Diagnosis not present

## 2023-04-12 DIAGNOSIS — M25512 Pain in left shoulder: Secondary | ICD-10-CM | POA: Diagnosis not present

## 2023-04-12 DIAGNOSIS — M25511 Pain in right shoulder: Secondary | ICD-10-CM | POA: Diagnosis not present

## 2023-04-17 DIAGNOSIS — J3081 Allergic rhinitis due to animal (cat) (dog) hair and dander: Secondary | ICD-10-CM | POA: Diagnosis not present

## 2023-04-17 DIAGNOSIS — J3089 Other allergic rhinitis: Secondary | ICD-10-CM | POA: Diagnosis not present

## 2023-04-17 DIAGNOSIS — J301 Allergic rhinitis due to pollen: Secondary | ICD-10-CM | POA: Diagnosis not present

## 2023-05-09 DIAGNOSIS — M65351 Trigger finger, right little finger: Secondary | ICD-10-CM | POA: Diagnosis not present

## 2023-05-10 DIAGNOSIS — M25512 Pain in left shoulder: Secondary | ICD-10-CM | POA: Diagnosis not present

## 2023-05-10 DIAGNOSIS — M25511 Pain in right shoulder: Secondary | ICD-10-CM | POA: Diagnosis not present

## 2023-05-12 DIAGNOSIS — J3081 Allergic rhinitis due to animal (cat) (dog) hair and dander: Secondary | ICD-10-CM | POA: Diagnosis not present

## 2023-05-12 DIAGNOSIS — J3089 Other allergic rhinitis: Secondary | ICD-10-CM | POA: Diagnosis not present

## 2023-05-12 DIAGNOSIS — J301 Allergic rhinitis due to pollen: Secondary | ICD-10-CM | POA: Diagnosis not present

## 2023-05-15 DIAGNOSIS — M7541 Impingement syndrome of right shoulder: Secondary | ICD-10-CM | POA: Diagnosis not present

## 2023-05-15 DIAGNOSIS — M7542 Impingement syndrome of left shoulder: Secondary | ICD-10-CM | POA: Diagnosis not present

## 2023-05-17 DIAGNOSIS — M7541 Impingement syndrome of right shoulder: Secondary | ICD-10-CM | POA: Diagnosis not present

## 2023-05-17 DIAGNOSIS — M25511 Pain in right shoulder: Secondary | ICD-10-CM | POA: Diagnosis not present

## 2023-05-19 DIAGNOSIS — H16223 Keratoconjunctivitis sicca, not specified as Sjogren's, bilateral: Secondary | ICD-10-CM | POA: Diagnosis not present

## 2023-05-19 DIAGNOSIS — H0279 Other degenerative disorders of eyelid and periocular area: Secondary | ICD-10-CM | POA: Diagnosis not present

## 2023-05-19 DIAGNOSIS — H0102A Squamous blepharitis right eye, upper and lower eyelids: Secondary | ICD-10-CM | POA: Diagnosis not present

## 2023-05-19 DIAGNOSIS — H04223 Epiphora due to insufficient drainage, bilateral lacrimal glands: Secondary | ICD-10-CM | POA: Diagnosis not present

## 2023-05-19 DIAGNOSIS — H0102B Squamous blepharitis left eye, upper and lower eyelids: Secondary | ICD-10-CM | POA: Diagnosis not present

## 2023-05-19 DIAGNOSIS — L570 Actinic keratosis: Secondary | ICD-10-CM | POA: Diagnosis not present

## 2023-05-22 DIAGNOSIS — M25511 Pain in right shoulder: Secondary | ICD-10-CM | POA: Diagnosis not present

## 2023-05-22 DIAGNOSIS — M7541 Impingement syndrome of right shoulder: Secondary | ICD-10-CM | POA: Diagnosis not present

## 2023-05-24 DIAGNOSIS — M7541 Impingement syndrome of right shoulder: Secondary | ICD-10-CM | POA: Diagnosis not present

## 2023-05-24 DIAGNOSIS — M25511 Pain in right shoulder: Secondary | ICD-10-CM | POA: Diagnosis not present

## 2023-05-26 DIAGNOSIS — M65351 Trigger finger, right little finger: Secondary | ICD-10-CM | POA: Diagnosis not present

## 2023-05-31 DIAGNOSIS — M25511 Pain in right shoulder: Secondary | ICD-10-CM | POA: Diagnosis not present

## 2023-05-31 DIAGNOSIS — M7541 Impingement syndrome of right shoulder: Secondary | ICD-10-CM | POA: Diagnosis not present

## 2023-06-02 DIAGNOSIS — M25511 Pain in right shoulder: Secondary | ICD-10-CM | POA: Diagnosis not present

## 2023-06-02 DIAGNOSIS — M7541 Impingement syndrome of right shoulder: Secondary | ICD-10-CM | POA: Diagnosis not present

## 2023-06-05 DIAGNOSIS — M7541 Impingement syndrome of right shoulder: Secondary | ICD-10-CM | POA: Diagnosis not present

## 2023-06-05 DIAGNOSIS — M25511 Pain in right shoulder: Secondary | ICD-10-CM | POA: Diagnosis not present

## 2023-06-07 DIAGNOSIS — M7541 Impingement syndrome of right shoulder: Secondary | ICD-10-CM | POA: Diagnosis not present

## 2023-06-07 DIAGNOSIS — M25511 Pain in right shoulder: Secondary | ICD-10-CM | POA: Diagnosis not present

## 2023-06-09 DIAGNOSIS — J3089 Other allergic rhinitis: Secondary | ICD-10-CM | POA: Diagnosis not present

## 2023-06-09 DIAGNOSIS — J301 Allergic rhinitis due to pollen: Secondary | ICD-10-CM | POA: Diagnosis not present

## 2023-06-12 DIAGNOSIS — R351 Nocturia: Secondary | ICD-10-CM | POA: Diagnosis not present

## 2023-06-12 DIAGNOSIS — M25511 Pain in right shoulder: Secondary | ICD-10-CM | POA: Diagnosis not present

## 2023-06-12 DIAGNOSIS — N200 Calculus of kidney: Secondary | ICD-10-CM | POA: Diagnosis not present

## 2023-06-12 DIAGNOSIS — M7541 Impingement syndrome of right shoulder: Secondary | ICD-10-CM | POA: Diagnosis not present

## 2023-06-12 DIAGNOSIS — N401 Enlarged prostate with lower urinary tract symptoms: Secondary | ICD-10-CM | POA: Diagnosis not present

## 2023-06-21 DIAGNOSIS — J3089 Other allergic rhinitis: Secondary | ICD-10-CM | POA: Diagnosis not present

## 2023-06-21 DIAGNOSIS — J301 Allergic rhinitis due to pollen: Secondary | ICD-10-CM | POA: Diagnosis not present

## 2023-06-22 DIAGNOSIS — M25511 Pain in right shoulder: Secondary | ICD-10-CM | POA: Diagnosis not present

## 2023-06-22 DIAGNOSIS — M7541 Impingement syndrome of right shoulder: Secondary | ICD-10-CM | POA: Diagnosis not present

## 2023-06-23 DIAGNOSIS — H16223 Keratoconjunctivitis sicca, not specified as Sjogren's, bilateral: Secondary | ICD-10-CM | POA: Diagnosis not present

## 2023-06-23 DIAGNOSIS — H0102A Squamous blepharitis right eye, upper and lower eyelids: Secondary | ICD-10-CM | POA: Diagnosis not present

## 2023-06-23 DIAGNOSIS — H0279 Other degenerative disorders of eyelid and periocular area: Secondary | ICD-10-CM | POA: Diagnosis not present

## 2023-06-23 DIAGNOSIS — H04223 Epiphora due to insufficient drainage, bilateral lacrimal glands: Secondary | ICD-10-CM | POA: Diagnosis not present

## 2023-06-23 DIAGNOSIS — H0102B Squamous blepharitis left eye, upper and lower eyelids: Secondary | ICD-10-CM | POA: Diagnosis not present

## 2023-06-26 DIAGNOSIS — M25511 Pain in right shoulder: Secondary | ICD-10-CM | POA: Diagnosis not present

## 2023-06-26 DIAGNOSIS — M7541 Impingement syndrome of right shoulder: Secondary | ICD-10-CM | POA: Diagnosis not present

## 2023-06-28 DIAGNOSIS — M7542 Impingement syndrome of left shoulder: Secondary | ICD-10-CM | POA: Diagnosis not present

## 2023-06-28 DIAGNOSIS — M7541 Impingement syndrome of right shoulder: Secondary | ICD-10-CM | POA: Diagnosis not present

## 2023-06-28 DIAGNOSIS — M25511 Pain in right shoulder: Secondary | ICD-10-CM | POA: Diagnosis not present

## 2023-07-05 DIAGNOSIS — M7541 Impingement syndrome of right shoulder: Secondary | ICD-10-CM | POA: Diagnosis not present

## 2023-07-05 DIAGNOSIS — M25511 Pain in right shoulder: Secondary | ICD-10-CM | POA: Diagnosis not present

## 2023-07-07 DIAGNOSIS — M7541 Impingement syndrome of right shoulder: Secondary | ICD-10-CM | POA: Diagnosis not present

## 2023-07-07 DIAGNOSIS — M25511 Pain in right shoulder: Secondary | ICD-10-CM | POA: Diagnosis not present

## 2023-07-10 DIAGNOSIS — M7541 Impingement syndrome of right shoulder: Secondary | ICD-10-CM | POA: Diagnosis not present

## 2023-07-10 DIAGNOSIS — M25511 Pain in right shoulder: Secondary | ICD-10-CM | POA: Diagnosis not present

## 2023-07-12 DIAGNOSIS — M7541 Impingement syndrome of right shoulder: Secondary | ICD-10-CM | POA: Diagnosis not present

## 2023-07-12 DIAGNOSIS — M25511 Pain in right shoulder: Secondary | ICD-10-CM | POA: Diagnosis not present

## 2023-07-13 DIAGNOSIS — M65351 Trigger finger, right little finger: Secondary | ICD-10-CM | POA: Diagnosis not present

## 2023-07-13 DIAGNOSIS — G5603 Carpal tunnel syndrome, bilateral upper limbs: Secondary | ICD-10-CM | POA: Diagnosis not present

## 2023-07-14 DIAGNOSIS — J3089 Other allergic rhinitis: Secondary | ICD-10-CM | POA: Diagnosis not present

## 2023-07-14 DIAGNOSIS — J3081 Allergic rhinitis due to animal (cat) (dog) hair and dander: Secondary | ICD-10-CM | POA: Diagnosis not present

## 2023-07-14 DIAGNOSIS — J301 Allergic rhinitis due to pollen: Secondary | ICD-10-CM | POA: Diagnosis not present

## 2023-07-17 DIAGNOSIS — M25511 Pain in right shoulder: Secondary | ICD-10-CM | POA: Diagnosis not present

## 2023-07-17 DIAGNOSIS — M7541 Impingement syndrome of right shoulder: Secondary | ICD-10-CM | POA: Diagnosis not present

## 2023-07-19 DIAGNOSIS — H02132 Senile ectropion of right lower eyelid: Secondary | ICD-10-CM | POA: Diagnosis not present

## 2023-07-19 DIAGNOSIS — H02135 Senile ectropion of left lower eyelid: Secondary | ICD-10-CM | POA: Diagnosis not present

## 2023-07-19 DIAGNOSIS — H04222 Epiphora due to insufficient drainage, left lacrimal gland: Secondary | ICD-10-CM | POA: Diagnosis not present

## 2023-07-19 DIAGNOSIS — Z09 Encounter for follow-up examination after completed treatment for conditions other than malignant neoplasm: Secondary | ICD-10-CM | POA: Diagnosis not present

## 2023-07-19 DIAGNOSIS — H04412 Chronic dacryocystitis of left lacrimal passage: Secondary | ICD-10-CM | POA: Diagnosis not present

## 2023-07-19 DIAGNOSIS — Z01818 Encounter for other preprocedural examination: Secondary | ICD-10-CM | POA: Diagnosis not present

## 2023-07-19 DIAGNOSIS — H04552 Acquired stenosis of left nasolacrimal duct: Secondary | ICD-10-CM | POA: Diagnosis not present

## 2023-07-19 DIAGNOSIS — H0279 Other degenerative disorders of eyelid and periocular area: Secondary | ICD-10-CM | POA: Diagnosis not present

## 2023-07-21 DIAGNOSIS — M7541 Impingement syndrome of right shoulder: Secondary | ICD-10-CM | POA: Diagnosis not present

## 2023-07-21 DIAGNOSIS — M25511 Pain in right shoulder: Secondary | ICD-10-CM | POA: Diagnosis not present

## 2023-07-24 DIAGNOSIS — M25511 Pain in right shoulder: Secondary | ICD-10-CM | POA: Diagnosis not present

## 2023-07-24 DIAGNOSIS — M7541 Impingement syndrome of right shoulder: Secondary | ICD-10-CM | POA: Diagnosis not present

## 2023-07-25 DIAGNOSIS — L820 Inflamed seborrheic keratosis: Secondary | ICD-10-CM | POA: Diagnosis not present

## 2023-07-25 DIAGNOSIS — D485 Neoplasm of uncertain behavior of skin: Secondary | ICD-10-CM | POA: Diagnosis not present

## 2023-07-25 DIAGNOSIS — L578 Other skin changes due to chronic exposure to nonionizing radiation: Secondary | ICD-10-CM | POA: Diagnosis not present

## 2023-07-25 DIAGNOSIS — Z86018 Personal history of other benign neoplasm: Secondary | ICD-10-CM | POA: Diagnosis not present

## 2023-07-25 DIAGNOSIS — Z85828 Personal history of other malignant neoplasm of skin: Secondary | ICD-10-CM | POA: Diagnosis not present

## 2023-07-25 DIAGNOSIS — D235 Other benign neoplasm of skin of trunk: Secondary | ICD-10-CM | POA: Diagnosis not present

## 2023-07-25 DIAGNOSIS — L570 Actinic keratosis: Secondary | ICD-10-CM | POA: Diagnosis not present

## 2023-07-25 DIAGNOSIS — D2271 Melanocytic nevi of right lower limb, including hip: Secondary | ICD-10-CM | POA: Diagnosis not present

## 2023-07-25 DIAGNOSIS — L821 Other seborrheic keratosis: Secondary | ICD-10-CM | POA: Diagnosis not present

## 2023-07-25 DIAGNOSIS — D225 Melanocytic nevi of trunk: Secondary | ICD-10-CM | POA: Diagnosis not present

## 2023-07-26 DIAGNOSIS — M25511 Pain in right shoulder: Secondary | ICD-10-CM | POA: Diagnosis not present

## 2023-07-26 DIAGNOSIS — M7541 Impingement syndrome of right shoulder: Secondary | ICD-10-CM | POA: Diagnosis not present

## 2023-07-31 DIAGNOSIS — M25511 Pain in right shoulder: Secondary | ICD-10-CM | POA: Diagnosis not present

## 2023-07-31 DIAGNOSIS — M7541 Impingement syndrome of right shoulder: Secondary | ICD-10-CM | POA: Diagnosis not present

## 2023-08-02 DIAGNOSIS — M25511 Pain in right shoulder: Secondary | ICD-10-CM | POA: Diagnosis not present

## 2023-08-02 DIAGNOSIS — M7541 Impingement syndrome of right shoulder: Secondary | ICD-10-CM | POA: Diagnosis not present

## 2023-08-07 DIAGNOSIS — M7541 Impingement syndrome of right shoulder: Secondary | ICD-10-CM | POA: Diagnosis not present

## 2023-08-07 DIAGNOSIS — M25511 Pain in right shoulder: Secondary | ICD-10-CM | POA: Diagnosis not present

## 2023-08-09 DIAGNOSIS — M25511 Pain in right shoulder: Secondary | ICD-10-CM | POA: Diagnosis not present

## 2023-08-09 DIAGNOSIS — M7541 Impingement syndrome of right shoulder: Secondary | ICD-10-CM | POA: Diagnosis not present

## 2023-08-19 ENCOUNTER — Other Ambulatory Visit: Payer: Self-pay | Admitting: Cardiology

## 2023-08-21 DIAGNOSIS — J301 Allergic rhinitis due to pollen: Secondary | ICD-10-CM | POA: Diagnosis not present

## 2023-08-21 DIAGNOSIS — J3089 Other allergic rhinitis: Secondary | ICD-10-CM | POA: Diagnosis not present

## 2023-08-21 DIAGNOSIS — J3081 Allergic rhinitis due to animal (cat) (dog) hair and dander: Secondary | ICD-10-CM | POA: Diagnosis not present

## 2023-08-22 DIAGNOSIS — M7541 Impingement syndrome of right shoulder: Secondary | ICD-10-CM | POA: Diagnosis not present

## 2023-08-22 DIAGNOSIS — M25511 Pain in right shoulder: Secondary | ICD-10-CM | POA: Diagnosis not present

## 2023-08-24 DIAGNOSIS — M7541 Impingement syndrome of right shoulder: Secondary | ICD-10-CM | POA: Diagnosis not present

## 2023-08-24 DIAGNOSIS — M25511 Pain in right shoulder: Secondary | ICD-10-CM | POA: Diagnosis not present

## 2023-08-28 DIAGNOSIS — J3089 Other allergic rhinitis: Secondary | ICD-10-CM | POA: Diagnosis not present

## 2023-08-28 DIAGNOSIS — J301 Allergic rhinitis due to pollen: Secondary | ICD-10-CM | POA: Diagnosis not present

## 2023-08-28 DIAGNOSIS — J3081 Allergic rhinitis due to animal (cat) (dog) hair and dander: Secondary | ICD-10-CM | POA: Diagnosis not present

## 2023-08-29 DIAGNOSIS — M25511 Pain in right shoulder: Secondary | ICD-10-CM | POA: Diagnosis not present

## 2023-08-29 DIAGNOSIS — M7541 Impingement syndrome of right shoulder: Secondary | ICD-10-CM | POA: Diagnosis not present

## 2023-08-31 DIAGNOSIS — M7541 Impingement syndrome of right shoulder: Secondary | ICD-10-CM | POA: Diagnosis not present

## 2023-08-31 DIAGNOSIS — M25511 Pain in right shoulder: Secondary | ICD-10-CM | POA: Diagnosis not present

## 2023-09-04 DIAGNOSIS — M7541 Impingement syndrome of right shoulder: Secondary | ICD-10-CM | POA: Diagnosis not present

## 2023-09-04 DIAGNOSIS — J3081 Allergic rhinitis due to animal (cat) (dog) hair and dander: Secondary | ICD-10-CM | POA: Diagnosis not present

## 2023-09-04 DIAGNOSIS — J3089 Other allergic rhinitis: Secondary | ICD-10-CM | POA: Diagnosis not present

## 2023-09-04 DIAGNOSIS — M25511 Pain in right shoulder: Secondary | ICD-10-CM | POA: Diagnosis not present

## 2023-09-04 DIAGNOSIS — J301 Allergic rhinitis due to pollen: Secondary | ICD-10-CM | POA: Diagnosis not present

## 2023-09-07 DIAGNOSIS — M25511 Pain in right shoulder: Secondary | ICD-10-CM | POA: Diagnosis not present

## 2023-09-07 DIAGNOSIS — M7541 Impingement syndrome of right shoulder: Secondary | ICD-10-CM | POA: Diagnosis not present

## 2023-09-11 DIAGNOSIS — J301 Allergic rhinitis due to pollen: Secondary | ICD-10-CM | POA: Diagnosis not present

## 2023-09-11 DIAGNOSIS — J3081 Allergic rhinitis due to animal (cat) (dog) hair and dander: Secondary | ICD-10-CM | POA: Diagnosis not present

## 2023-09-11 DIAGNOSIS — J3089 Other allergic rhinitis: Secondary | ICD-10-CM | POA: Diagnosis not present

## 2023-09-19 DIAGNOSIS — J301 Allergic rhinitis due to pollen: Secondary | ICD-10-CM | POA: Diagnosis not present

## 2023-09-19 DIAGNOSIS — J3081 Allergic rhinitis due to animal (cat) (dog) hair and dander: Secondary | ICD-10-CM | POA: Diagnosis not present

## 2023-09-19 DIAGNOSIS — J3089 Other allergic rhinitis: Secondary | ICD-10-CM | POA: Diagnosis not present

## 2023-10-09 DIAGNOSIS — H04412 Chronic dacryocystitis of left lacrimal passage: Secondary | ICD-10-CM | POA: Diagnosis not present

## 2023-10-09 DIAGNOSIS — H02135 Senile ectropion of left lower eyelid: Secondary | ICD-10-CM | POA: Diagnosis not present

## 2023-10-09 DIAGNOSIS — H02132 Senile ectropion of right lower eyelid: Secondary | ICD-10-CM | POA: Diagnosis not present

## 2023-10-09 DIAGNOSIS — H04552 Acquired stenosis of left nasolacrimal duct: Secondary | ICD-10-CM | POA: Diagnosis not present

## 2023-10-09 DIAGNOSIS — H04222 Epiphora due to insufficient drainage, left lacrimal gland: Secondary | ICD-10-CM | POA: Diagnosis not present

## 2023-10-09 DIAGNOSIS — H0279 Other degenerative disorders of eyelid and periocular area: Secondary | ICD-10-CM | POA: Diagnosis not present

## 2023-10-12 DIAGNOSIS — H04552 Acquired stenosis of left nasolacrimal duct: Secondary | ICD-10-CM | POA: Diagnosis not present

## 2023-10-12 DIAGNOSIS — Z09 Encounter for follow-up examination after completed treatment for conditions other than malignant neoplasm: Secondary | ICD-10-CM | POA: Diagnosis not present

## 2023-10-12 DIAGNOSIS — H0279 Other degenerative disorders of eyelid and periocular area: Secondary | ICD-10-CM | POA: Diagnosis not present

## 2023-10-12 DIAGNOSIS — R0981 Nasal congestion: Secondary | ICD-10-CM | POA: Diagnosis not present

## 2023-10-13 DIAGNOSIS — J3081 Allergic rhinitis due to animal (cat) (dog) hair and dander: Secondary | ICD-10-CM | POA: Diagnosis not present

## 2023-10-13 DIAGNOSIS — J3089 Other allergic rhinitis: Secondary | ICD-10-CM | POA: Diagnosis not present

## 2023-10-13 DIAGNOSIS — J301 Allergic rhinitis due to pollen: Secondary | ICD-10-CM | POA: Diagnosis not present

## 2023-10-20 DIAGNOSIS — H04223 Epiphora due to insufficient drainage, bilateral lacrimal glands: Secondary | ICD-10-CM | POA: Diagnosis not present

## 2023-10-20 DIAGNOSIS — H0102B Squamous blepharitis left eye, upper and lower eyelids: Secondary | ICD-10-CM | POA: Diagnosis not present

## 2023-10-20 DIAGNOSIS — J3089 Other allergic rhinitis: Secondary | ICD-10-CM | POA: Diagnosis not present

## 2023-10-20 DIAGNOSIS — H0102A Squamous blepharitis right eye, upper and lower eyelids: Secondary | ICD-10-CM | POA: Diagnosis not present

## 2023-10-20 DIAGNOSIS — J3081 Allergic rhinitis due to animal (cat) (dog) hair and dander: Secondary | ICD-10-CM | POA: Diagnosis not present

## 2023-10-20 DIAGNOSIS — H16223 Keratoconjunctivitis sicca, not specified as Sjogren's, bilateral: Secondary | ICD-10-CM | POA: Diagnosis not present

## 2023-10-20 DIAGNOSIS — H0279 Other degenerative disorders of eyelid and periocular area: Secondary | ICD-10-CM | POA: Diagnosis not present

## 2023-10-20 DIAGNOSIS — J301 Allergic rhinitis due to pollen: Secondary | ICD-10-CM | POA: Diagnosis not present

## 2023-10-23 DIAGNOSIS — H04552 Acquired stenosis of left nasolacrimal duct: Secondary | ICD-10-CM | POA: Diagnosis not present

## 2023-10-23 DIAGNOSIS — R0981 Nasal congestion: Secondary | ICD-10-CM | POA: Diagnosis not present

## 2023-10-23 DIAGNOSIS — Z09 Encounter for follow-up examination after completed treatment for conditions other than malignant neoplasm: Secondary | ICD-10-CM | POA: Diagnosis not present

## 2023-10-23 DIAGNOSIS — H0279 Other degenerative disorders of eyelid and periocular area: Secondary | ICD-10-CM | POA: Diagnosis not present

## 2023-10-25 DIAGNOSIS — J3089 Other allergic rhinitis: Secondary | ICD-10-CM | POA: Diagnosis not present

## 2023-10-25 DIAGNOSIS — J301 Allergic rhinitis due to pollen: Secondary | ICD-10-CM | POA: Diagnosis not present

## 2023-10-25 DIAGNOSIS — J3081 Allergic rhinitis due to animal (cat) (dog) hair and dander: Secondary | ICD-10-CM | POA: Diagnosis not present

## 2023-10-31 DIAGNOSIS — G5601 Carpal tunnel syndrome, right upper limb: Secondary | ICD-10-CM | POA: Diagnosis not present

## 2023-11-14 DIAGNOSIS — G5601 Carpal tunnel syndrome, right upper limb: Secondary | ICD-10-CM | POA: Diagnosis not present

## 2023-11-15 DIAGNOSIS — J301 Allergic rhinitis due to pollen: Secondary | ICD-10-CM | POA: Diagnosis not present

## 2023-11-15 DIAGNOSIS — J3089 Other allergic rhinitis: Secondary | ICD-10-CM | POA: Diagnosis not present

## 2023-11-27 DIAGNOSIS — T7840XA Allergy, unspecified, initial encounter: Secondary | ICD-10-CM | POA: Diagnosis not present

## 2023-11-27 DIAGNOSIS — R42 Dizziness and giddiness: Secondary | ICD-10-CM | POA: Diagnosis not present

## 2023-11-27 DIAGNOSIS — R7301 Impaired fasting glucose: Secondary | ICD-10-CM | POA: Diagnosis not present

## 2023-11-27 DIAGNOSIS — I10 Essential (primary) hypertension: Secondary | ICD-10-CM | POA: Diagnosis not present

## 2023-12-06 DIAGNOSIS — Z09 Encounter for follow-up examination after completed treatment for conditions other than malignant neoplasm: Secondary | ICD-10-CM | POA: Diagnosis not present

## 2023-12-06 DIAGNOSIS — R0981 Nasal congestion: Secondary | ICD-10-CM | POA: Diagnosis not present

## 2023-12-06 DIAGNOSIS — H0279 Other degenerative disorders of eyelid and periocular area: Secondary | ICD-10-CM | POA: Diagnosis not present

## 2023-12-06 DIAGNOSIS — H04552 Acquired stenosis of left nasolacrimal duct: Secondary | ICD-10-CM | POA: Diagnosis not present

## 2023-12-13 DIAGNOSIS — J3081 Allergic rhinitis due to animal (cat) (dog) hair and dander: Secondary | ICD-10-CM | POA: Diagnosis not present

## 2023-12-13 DIAGNOSIS — J3089 Other allergic rhinitis: Secondary | ICD-10-CM | POA: Diagnosis not present

## 2023-12-13 DIAGNOSIS — J301 Allergic rhinitis due to pollen: Secondary | ICD-10-CM | POA: Diagnosis not present

## 2023-12-15 DIAGNOSIS — H0288B Meibomian gland dysfunction left eye, upper and lower eyelids: Secondary | ICD-10-CM | POA: Diagnosis not present

## 2023-12-15 DIAGNOSIS — H0102A Squamous blepharitis right eye, upper and lower eyelids: Secondary | ICD-10-CM | POA: Diagnosis not present

## 2023-12-15 DIAGNOSIS — H0102B Squamous blepharitis left eye, upper and lower eyelids: Secondary | ICD-10-CM | POA: Diagnosis not present

## 2023-12-15 DIAGNOSIS — H04223 Epiphora due to insufficient drainage, bilateral lacrimal glands: Secondary | ICD-10-CM | POA: Diagnosis not present

## 2023-12-15 DIAGNOSIS — H0288A Meibomian gland dysfunction right eye, upper and lower eyelids: Secondary | ICD-10-CM | POA: Diagnosis not present

## 2023-12-15 DIAGNOSIS — H0279 Other degenerative disorders of eyelid and periocular area: Secondary | ICD-10-CM | POA: Diagnosis not present

## 2023-12-15 DIAGNOSIS — H16223 Keratoconjunctivitis sicca, not specified as Sjogren's, bilateral: Secondary | ICD-10-CM | POA: Diagnosis not present

## 2023-12-19 DIAGNOSIS — I10 Essential (primary) hypertension: Secondary | ICD-10-CM | POA: Diagnosis not present

## 2023-12-19 DIAGNOSIS — E785 Hyperlipidemia, unspecified: Secondary | ICD-10-CM | POA: Diagnosis not present

## 2023-12-19 DIAGNOSIS — R7301 Impaired fasting glucose: Secondary | ICD-10-CM | POA: Diagnosis not present

## 2023-12-19 DIAGNOSIS — E7849 Other hyperlipidemia: Secondary | ICD-10-CM | POA: Diagnosis not present

## 2023-12-19 DIAGNOSIS — Z125 Encounter for screening for malignant neoplasm of prostate: Secondary | ICD-10-CM | POA: Diagnosis not present

## 2023-12-19 DIAGNOSIS — Z1389 Encounter for screening for other disorder: Secondary | ICD-10-CM | POA: Diagnosis not present

## 2023-12-26 DIAGNOSIS — G4700 Insomnia, unspecified: Secondary | ICD-10-CM | POA: Diagnosis not present

## 2023-12-26 DIAGNOSIS — Z1331 Encounter for screening for depression: Secondary | ICD-10-CM | POA: Diagnosis not present

## 2023-12-26 DIAGNOSIS — Z87442 Personal history of urinary calculi: Secondary | ICD-10-CM | POA: Diagnosis not present

## 2023-12-26 DIAGNOSIS — R195 Other fecal abnormalities: Secondary | ICD-10-CM | POA: Diagnosis not present

## 2023-12-26 DIAGNOSIS — I700 Atherosclerosis of aorta: Secondary | ICD-10-CM | POA: Diagnosis not present

## 2023-12-26 DIAGNOSIS — M545 Low back pain, unspecified: Secondary | ICD-10-CM | POA: Diagnosis not present

## 2023-12-26 DIAGNOSIS — R42 Dizziness and giddiness: Secondary | ICD-10-CM | POA: Diagnosis not present

## 2023-12-26 DIAGNOSIS — R5383 Other fatigue: Secondary | ICD-10-CM | POA: Diagnosis not present

## 2023-12-26 DIAGNOSIS — I10 Essential (primary) hypertension: Secondary | ICD-10-CM | POA: Diagnosis not present

## 2023-12-26 DIAGNOSIS — N400 Enlarged prostate without lower urinary tract symptoms: Secondary | ICD-10-CM | POA: Diagnosis not present

## 2023-12-26 DIAGNOSIS — Z860100 Personal history of colon polyps, unspecified: Secondary | ICD-10-CM | POA: Diagnosis not present

## 2023-12-26 DIAGNOSIS — G729 Myopathy, unspecified: Secondary | ICD-10-CM | POA: Diagnosis not present

## 2023-12-26 DIAGNOSIS — R7301 Impaired fasting glucose: Secondary | ICD-10-CM | POA: Diagnosis not present

## 2023-12-26 DIAGNOSIS — E785 Hyperlipidemia, unspecified: Secondary | ICD-10-CM | POA: Diagnosis not present

## 2023-12-26 DIAGNOSIS — Z0000 Encounter for general adult medical examination without abnormal findings: Secondary | ICD-10-CM | POA: Diagnosis not present

## 2023-12-26 DIAGNOSIS — G250 Essential tremor: Secondary | ICD-10-CM | POA: Diagnosis not present

## 2023-12-26 DIAGNOSIS — Z1339 Encounter for screening examination for other mental health and behavioral disorders: Secondary | ICD-10-CM | POA: Diagnosis not present

## 2023-12-27 DIAGNOSIS — D225 Melanocytic nevi of trunk: Secondary | ICD-10-CM | POA: Diagnosis not present

## 2023-12-27 DIAGNOSIS — L821 Other seborrheic keratosis: Secondary | ICD-10-CM | POA: Diagnosis not present

## 2023-12-27 DIAGNOSIS — D2271 Melanocytic nevi of right lower limb, including hip: Secondary | ICD-10-CM | POA: Diagnosis not present

## 2023-12-27 DIAGNOSIS — I10 Essential (primary) hypertension: Secondary | ICD-10-CM | POA: Diagnosis not present

## 2023-12-27 DIAGNOSIS — Z85828 Personal history of other malignant neoplasm of skin: Secondary | ICD-10-CM | POA: Diagnosis not present

## 2023-12-27 DIAGNOSIS — Z86018 Personal history of other benign neoplasm: Secondary | ICD-10-CM | POA: Diagnosis not present

## 2023-12-27 DIAGNOSIS — L820 Inflamed seborrheic keratosis: Secondary | ICD-10-CM | POA: Diagnosis not present

## 2023-12-27 DIAGNOSIS — L570 Actinic keratosis: Secondary | ICD-10-CM | POA: Diagnosis not present

## 2023-12-27 DIAGNOSIS — D485 Neoplasm of uncertain behavior of skin: Secondary | ICD-10-CM | POA: Diagnosis not present

## 2023-12-27 DIAGNOSIS — L578 Other skin changes due to chronic exposure to nonionizing radiation: Secondary | ICD-10-CM | POA: Diagnosis not present

## 2023-12-27 DIAGNOSIS — C4441 Basal cell carcinoma of skin of scalp and neck: Secondary | ICD-10-CM | POA: Diagnosis not present

## 2024-01-02 DIAGNOSIS — R1310 Dysphagia, unspecified: Secondary | ICD-10-CM | POA: Diagnosis not present

## 2024-01-02 DIAGNOSIS — R197 Diarrhea, unspecified: Secondary | ICD-10-CM | POA: Diagnosis not present

## 2024-01-03 DIAGNOSIS — J3089 Other allergic rhinitis: Secondary | ICD-10-CM | POA: Diagnosis not present

## 2024-01-03 DIAGNOSIS — J301 Allergic rhinitis due to pollen: Secondary | ICD-10-CM | POA: Diagnosis not present

## 2024-01-03 DIAGNOSIS — R197 Diarrhea, unspecified: Secondary | ICD-10-CM | POA: Diagnosis not present

## 2024-01-03 DIAGNOSIS — J3081 Allergic rhinitis due to animal (cat) (dog) hair and dander: Secondary | ICD-10-CM | POA: Diagnosis not present

## 2024-01-09 ENCOUNTER — Ambulatory Visit
Admission: RE | Admit: 2024-01-09 | Discharge: 2024-01-09 | Disposition: A | Discharge status: Home / Self Care | Source: Ambulatory Visit

## 2024-01-09 ENCOUNTER — Other Ambulatory Visit: Payer: Self-pay

## 2024-01-09 DIAGNOSIS — R197 Diarrhea, unspecified: Secondary | ICD-10-CM

## 2024-01-09 DIAGNOSIS — N200 Calculus of kidney: Secondary | ICD-10-CM | POA: Diagnosis not present

## 2024-01-19 DIAGNOSIS — H0279 Other degenerative disorders of eyelid and periocular area: Secondary | ICD-10-CM | POA: Diagnosis not present

## 2024-01-19 DIAGNOSIS — J301 Allergic rhinitis due to pollen: Secondary | ICD-10-CM | POA: Diagnosis not present

## 2024-01-19 DIAGNOSIS — H0288B Meibomian gland dysfunction left eye, upper and lower eyelids: Secondary | ICD-10-CM | POA: Diagnosis not present

## 2024-01-19 DIAGNOSIS — J3089 Other allergic rhinitis: Secondary | ICD-10-CM | POA: Diagnosis not present

## 2024-01-19 DIAGNOSIS — H0102B Squamous blepharitis left eye, upper and lower eyelids: Secondary | ICD-10-CM | POA: Diagnosis not present

## 2024-01-19 DIAGNOSIS — H04223 Epiphora due to insufficient drainage, bilateral lacrimal glands: Secondary | ICD-10-CM | POA: Diagnosis not present

## 2024-01-19 DIAGNOSIS — H0288A Meibomian gland dysfunction right eye, upper and lower eyelids: Secondary | ICD-10-CM | POA: Diagnosis not present

## 2024-01-19 DIAGNOSIS — H0102A Squamous blepharitis right eye, upper and lower eyelids: Secondary | ICD-10-CM | POA: Diagnosis not present

## 2024-01-19 DIAGNOSIS — Z79899 Other long term (current) drug therapy: Secondary | ICD-10-CM | POA: Diagnosis not present

## 2024-01-19 DIAGNOSIS — R052 Subacute cough: Secondary | ICD-10-CM | POA: Diagnosis not present

## 2024-01-19 DIAGNOSIS — H16223 Keratoconjunctivitis sicca, not specified as Sjogren's, bilateral: Secondary | ICD-10-CM | POA: Diagnosis not present

## 2024-02-05 DIAGNOSIS — J301 Allergic rhinitis due to pollen: Secondary | ICD-10-CM | POA: Diagnosis not present

## 2024-02-05 DIAGNOSIS — J3089 Other allergic rhinitis: Secondary | ICD-10-CM | POA: Diagnosis not present

## 2024-02-05 DIAGNOSIS — J3081 Allergic rhinitis due to animal (cat) (dog) hair and dander: Secondary | ICD-10-CM | POA: Diagnosis not present

## 2024-02-26 DIAGNOSIS — J3089 Other allergic rhinitis: Secondary | ICD-10-CM | POA: Diagnosis not present

## 2024-02-26 DIAGNOSIS — J301 Allergic rhinitis due to pollen: Secondary | ICD-10-CM | POA: Diagnosis not present

## 2024-02-26 DIAGNOSIS — J3081 Allergic rhinitis due to animal (cat) (dog) hair and dander: Secondary | ICD-10-CM | POA: Diagnosis not present

## 2024-02-27 DIAGNOSIS — R197 Diarrhea, unspecified: Secondary | ICD-10-CM | POA: Diagnosis not present

## 2024-02-27 DIAGNOSIS — K219 Gastro-esophageal reflux disease without esophagitis: Secondary | ICD-10-CM | POA: Diagnosis not present

## 2024-02-27 DIAGNOSIS — K8681 Exocrine pancreatic insufficiency: Secondary | ICD-10-CM | POA: Diagnosis not present

## 2024-02-27 DIAGNOSIS — R1310 Dysphagia, unspecified: Secondary | ICD-10-CM | POA: Diagnosis not present

## 2024-02-27 DIAGNOSIS — Z9049 Acquired absence of other specified parts of digestive tract: Secondary | ICD-10-CM | POA: Diagnosis not present

## 2024-02-28 DIAGNOSIS — R197 Diarrhea, unspecified: Secondary | ICD-10-CM | POA: Diagnosis not present

## 2024-03-12 DIAGNOSIS — H1045 Other chronic allergic conjunctivitis: Secondary | ICD-10-CM | POA: Diagnosis not present

## 2024-03-12 DIAGNOSIS — R052 Subacute cough: Secondary | ICD-10-CM | POA: Diagnosis not present

## 2024-03-12 DIAGNOSIS — J019 Acute sinusitis, unspecified: Secondary | ICD-10-CM | POA: Diagnosis not present

## 2024-03-12 DIAGNOSIS — J301 Allergic rhinitis due to pollen: Secondary | ICD-10-CM | POA: Diagnosis not present

## 2024-03-15 DIAGNOSIS — H16223 Keratoconjunctivitis sicca, not specified as Sjogren's, bilateral: Secondary | ICD-10-CM | POA: Diagnosis not present

## 2024-03-15 DIAGNOSIS — H0102B Squamous blepharitis left eye, upper and lower eyelids: Secondary | ICD-10-CM | POA: Diagnosis not present

## 2024-03-15 DIAGNOSIS — H0288A Meibomian gland dysfunction right eye, upper and lower eyelids: Secondary | ICD-10-CM | POA: Diagnosis not present

## 2024-03-15 DIAGNOSIS — H0102A Squamous blepharitis right eye, upper and lower eyelids: Secondary | ICD-10-CM | POA: Diagnosis not present

## 2024-03-15 DIAGNOSIS — H0288B Meibomian gland dysfunction left eye, upper and lower eyelids: Secondary | ICD-10-CM | POA: Diagnosis not present

## 2024-03-15 DIAGNOSIS — H0279 Other degenerative disorders of eyelid and periocular area: Secondary | ICD-10-CM | POA: Diagnosis not present

## 2024-03-15 DIAGNOSIS — H04223 Epiphora due to insufficient drainage, bilateral lacrimal glands: Secondary | ICD-10-CM | POA: Diagnosis not present

## 2024-03-25 DIAGNOSIS — J3089 Other allergic rhinitis: Secondary | ICD-10-CM | POA: Diagnosis not present

## 2024-03-25 DIAGNOSIS — J301 Allergic rhinitis due to pollen: Secondary | ICD-10-CM | POA: Diagnosis not present

## 2024-03-25 DIAGNOSIS — J3081 Allergic rhinitis due to animal (cat) (dog) hair and dander: Secondary | ICD-10-CM | POA: Diagnosis not present

## 2024-03-27 DIAGNOSIS — J3089 Other allergic rhinitis: Secondary | ICD-10-CM | POA: Diagnosis not present

## 2024-03-27 DIAGNOSIS — J301 Allergic rhinitis due to pollen: Secondary | ICD-10-CM | POA: Diagnosis not present

## 2024-04-01 ENCOUNTER — Other Ambulatory Visit: Payer: Self-pay | Admitting: Cardiology

## 2024-04-04 DIAGNOSIS — C4441 Basal cell carcinoma of skin of scalp and neck: Secondary | ICD-10-CM | POA: Diagnosis not present

## 2024-04-30 ENCOUNTER — Other Ambulatory Visit: Payer: Self-pay | Admitting: Cardiology
